# Patient Record
Sex: Female | Born: 1980
Health system: Southern US, Community
[De-identification: ages and names within clinical notes are randomized; demographics above are authoritative.]

## PROBLEM LIST (undated history)

## (undated) DIAGNOSIS — Z789 Other specified health status: Secondary | ICD-10-CM

## (undated) DIAGNOSIS — Z973 Presence of spectacles and contact lenses: Secondary | ICD-10-CM

## (undated) DIAGNOSIS — D329 Benign neoplasm of meninges, unspecified: Secondary | ICD-10-CM

## (undated) HISTORY — DX: Benign neoplasm of meninges, unspecified: D32.9

## (undated) HISTORY — PX: WISDOM TOOTH EXTRACTION: SHX21

---
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
## 2003-08-20 ENCOUNTER — Other Ambulatory Visit: Admission: RE | Admit: 2003-08-20 | Discharge: 2003-08-20 | Payer: Self-pay | Admitting: Obstetrics and Gynecology

## 2012-02-12 ENCOUNTER — Ambulatory Visit (INDEPENDENT_AMBULATORY_CARE_PROVIDER_SITE_OTHER): Payer: BC Managed Care – PPO | Admitting: Family Medicine

## 2012-02-12 VITALS — BP 102/72 | HR 82 | Temp 97.8°F | Resp 16 | Ht 70.0 in | Wt 154.4 lb

## 2012-02-12 DIAGNOSIS — J40 Bronchitis, not specified as acute or chronic: Secondary | ICD-10-CM

## 2012-02-12 DIAGNOSIS — J329 Chronic sinusitis, unspecified: Secondary | ICD-10-CM

## 2012-02-12 MED ORDER — HYDROCODONE-HOMATROPINE 5-1.5 MG/5ML PO SYRP: 5.0000 mL | ORAL_SOLUTION | ORAL | Status: DC | PRN

## 2012-02-12 MED ORDER — AMOXICILLIN 875 MG PO TABS: 875.0000 mg | ORAL_TABLET | Freq: Two times a day (BID) | ORAL | Status: DC

## 2012-02-12 MED ORDER — BENZONATATE 100 MG PO CAPS: ORAL_CAPSULE | ORAL | Status: DC

## 2012-02-12 NOTE — Progress Notes (Signed)
Subjective: It is been a while since patient is being here. She is generally healthy lady. She got a cold about 2 weeks ago. It seemed to be doing better. It just started with a bad cough. Then a few days ago things right back up. She has a lot of head congestion and pressure. She is coughing up a lot of mucus and blowing a lot of mucus. She did not have any fever. She did not have a flu shot this year. She's been trying to get enough rest. She works as a IT consultant. Ears are not bothering her.  Objective:  TMs are normal. Nose is runny. She is a little red from blowing. Throat clear. Neck supple with small nodes. Chest is clear to auscultation. Heart regular without murmurs.  Assessment: Sinusitis/bronchitis  Plan: Explained to her that this could be a second virus or it could be a bacterial exacerbation of the previous scars. We are going treat with antibiotics and symptomatic therapy. She understands that. No labs were today.

## 2012-02-12 NOTE — Patient Instructions (Signed)
Fluids, rest  Tylenol or ibuprofen for aching or pain or fever  Cough syrup at night  Cough pills (tessalon) at work  Amoxicillin twice daily  Return if worse

## 2012-06-28 ENCOUNTER — Ambulatory Visit: Payer: BC Managed Care – PPO | Admitting: Family Medicine

## 2014-11-08 ENCOUNTER — Ambulatory Visit (INDEPENDENT_AMBULATORY_CARE_PROVIDER_SITE_OTHER): Payer: 59 | Admitting: Family Medicine

## 2014-11-08 ENCOUNTER — Ambulatory Visit (INDEPENDENT_AMBULATORY_CARE_PROVIDER_SITE_OTHER): Payer: 59

## 2014-11-08 VITALS — BP 128/81 | HR 63 | Temp 97.6°F | Resp 16 | Ht 70.0 in | Wt 153.2 lb

## 2014-11-08 DIAGNOSIS — M545 Low back pain, unspecified: Secondary | ICD-10-CM

## 2014-11-08 DIAGNOSIS — R109 Unspecified abdominal pain: Secondary | ICD-10-CM

## 2014-11-08 DIAGNOSIS — R110 Nausea: Secondary | ICD-10-CM

## 2014-11-08 DIAGNOSIS — K5900 Constipation, unspecified: Secondary | ICD-10-CM | POA: Diagnosis not present

## 2014-11-08 DIAGNOSIS — R1031 Right lower quadrant pain: Secondary | ICD-10-CM | POA: Diagnosis not present

## 2014-11-08 LAB — POCT URINALYSIS DIP (MANUAL ENTRY)
Bilirubin, UA: NEGATIVE
GLUCOSE UA: NEGATIVE
Ketones, POC UA: NEGATIVE
Leukocytes, UA: NEGATIVE
NITRITE UA: NEGATIVE
PROTEIN UA: NEGATIVE
SPEC GRAV UA: 1.015
UROBILINOGEN UA: 0.2
pH, UA: 6

## 2014-11-08 LAB — POC MICROSCOPIC URINALYSIS (UMFC): Mucus: ABSENT

## 2014-11-08 LAB — POCT CBC
Granulocyte percent: 54.7 %G (ref 37–80)
HEMATOCRIT: 35.8 % — AB (ref 37.7–47.9)
Hemoglobin: 12.3 g/dL (ref 12.2–16.2)
Lymph, poc: 3.6 — AB (ref 0.6–3.4)
MCH: 30.6 pg (ref 27–31.2)
MCHC: 34.2 g/dL (ref 31.8–35.4)
MCV: 89.6 fL (ref 80–97)
MID (CBC): 0.5 (ref 0–0.9)
MPV: 7.8 fL (ref 0–99.8)
POC GRANULOCYTE: 4.9 (ref 2–6.9)
POC LYMPH PERCENT: 39.7 %L (ref 10–50)
POC MID %: 5.6 % (ref 0–12)
Platelet Count, POC: 264 10*3/uL (ref 142–424)
RBC: 4 M/uL — AB (ref 4.04–5.48)
RDW, POC: 13.3 %
WBC: 9 10*3/uL (ref 4.6–10.2)

## 2014-11-08 LAB — POCT URINE PREGNANCY: Preg Test, Ur: NEGATIVE

## 2014-11-08 MED ORDER — POLYETHYLENE GLYCOL 3350 17 GM/SCOOP PO POWD: 17.0000 g | Freq: Two times a day (BID) | ORAL | Status: DC | PRN

## 2014-11-08 NOTE — Progress Notes (Signed)
[facility] [address], [place] [address] [phone]  Date:  11/08/2014   Name:  April Peterson   DOB:  [birth year]   MRN:  [id number]  PCP:  No primary care provider on file.    History of Present Illness:  April Peterson is a 34 y.o. female patient who presents to [facility] for chief complaint right flank pain, and nausea for 2 days.  Flank pain is shooting at the right side, but can also be intermittently dull.  She has some nausea, but no emesis.  She also had some diarrhea.  No blood or melena.  Pain has subsided at this time, but the pain can get severe 5/10.  She has no fever, dysuria, hematuria, or frequency.  She has no hx of kidney stone.  She hydrates 6 glasses of water.    She is currently on her menses since 4 days ago. -She generally has BM every other day.  Reports difficult GI for years.  No personal or familial hx of IBD.  There are no active problems to display for this patient.   History reviewed. No pertinent past medical history.  History reviewed. No pertinent past surgical history.  Social History  Substance Use Topics  . Smoking status: Never Smoker   . Smokeless tobacco: Never Used  . Alcohol Use: No    History reviewed. No pertinent family history.  No Known Allergies  Medication list has been reviewed and updated.  No current outpatient prescriptions on file prior to visit.   No current facility-administered medications on file prior to visit.    ROS ROS otherwise unremarkable unless listed above.    Physical Examination: BP 128/81 mmHg  Pulse 63  Temp(Src) 97.6 F (36.4 C) (Oral)  Resp 16  Ht 5\' 10"  (1.778 m)  Wt 153 lb 4 oz (69.514 kg)  BMI 21.99 kg/m2  SpO2 98%  LMP 11/05/2014 Ideal Body Weight: Weight in (lb) to have BMI = 25: 173.9  Physical Exam  Constitutional: She is oriented to person, place, and time. She appears well-developed and well-nourished. No distress.  HENT:  Head: Normocephalic and  atraumatic.  Right Ear: External ear normal.  Left Ear: External ear normal.  Eyes: Conjunctivae and EOM are normal. Pupils are equal, round, and reactive to light.  Cardiovascular: Normal rate.   Pulmonary/Chest: Effort normal. No respiratory distress.  Abdominal: Soft. Normal appearance. Bowel sounds are increased. There is no hepatosplenomegaly. There is tenderness in the right lower quadrant and suprapubic area. There is CVA tenderness.  Neurological: She is alert and oriented to person, place, and time.  Skin: She is not diaphoretic.  Psychiatric: She has a normal mood and affect. Her behavior is normal.    [facility] reading (PRIMARY) by  Dr. Joseph Art: Substantial stool at the ascending colon.  Results for orders placed or performed in visit on 11/08/14  POCT Microscopic Urinalysis ([facility])  Result Value Ref Range   WBC,UR,HPF,POC Few (A) None WBC/hpf   RBC,UR,HPF,POC Few (A) None RBC/hpf   Bacteria Few (A) None, Too numerous to count   Mucus Absent Absent   Epithelial Cells, UR Per Microscopy Few (A) None, Too numerous to count cells/hpf  POCT urinalysis dipstick  Result Value Ref Range   Color, UA yellow yellow   Clarity, UA clear clear   Glucose, UA negative negative   Bilirubin, UA negative negative   Ketones, POC UA negative negative   Spec Grav, UA 1.015  Blood, UA trace-intact (A) negative   pH, UA 6.0    Protein Ur, POC negative negative   Urobilinogen, UA 0.2    Nitrite, UA Negative Negative   Leukocytes, UA Negative Negative  POCT CBC  Result Value Ref Range   WBC 9.0 4.6 - 10.2 K/uL   Lymph, poc 3.6 (A) 0.6 - 3.4   POC LYMPH PERCENT 39.7 10 - 50 %L   MID (cbc) 0.5 0 - 0.9   POC MID % 5.6 0 - 12 %M   POC Granulocyte 4.9 2 - 6.9   Granulocyte percent 54.7 37 - 80 %G   RBC 4.00 (A) 4.04 - 5.48 M/uL   Hemoglobin 12.3 12.2 - 16.2 g/dL   HCT, POC 35.8 (A) 37.7 - 47.9 %   MCV 89.6 80 - 97 fL   MCH, POC 30.6 27 - 31.2 pg   MCHC 34.2 31.8 - 35.4 g/dL   RDW, POC  13.3 %   Platelet Count, POC 264 142 - 424 K/uL   MPV 7.8 0 - 99.8 fL  POCT urine pregnancy  Result Value Ref Range   Preg Test, Ur Negative Negative    Assessment and Plan: KHRISTIN Peterson is a 34 y.o. female who is here today for nausea and right flank pain. -diff dx includes nephrolithiasis, pyelo, constipation.  Appears likely the constipation.  We will treat with miralax at this time.  Advised to return if stools are not substantial, nausea returns, fever, and increased pain etc.    Constipation, unspecified constipation type - Plan: polyethylene glycol powder (GLYCOLAX/MIRALAX) powder  Bilateral low back pain without sciatica - Plan: POCT Microscopic Urinalysis ([facility]), POCT urinalysis dipstick  Right flank pain - Plan: POCT CBC, POCT urine pregnancy, DG Abd 1 View  Nausea without vomiting - Plan: POCT CBC, POCT urine pregnancy, DG Abd 1 View  Right lower quadrant pain  Ivar Drape, PA-C [facility] 11/08/2014 8:00 PM    Reviewed and agree with plan Ruthe Mannan, MD

## 2014-11-08 NOTE — Patient Instructions (Signed)

## 2014-11-09 ENCOUNTER — Encounter: Payer: Self-pay | Admitting: Physician Assistant

## 2015-06-04 LAB — OB RESULTS CONSOLE HIV ANTIBODY (ROUTINE TESTING): HIV: NONREACTIVE

## 2015-06-04 LAB — OB RESULTS CONSOLE RUBELLA ANTIBODY, IGM: Rubella: IMMUNE

## 2015-06-04 LAB — OB RESULTS CONSOLE ABO/RH: RH Type: POSITIVE

## 2015-06-04 LAB — OB RESULTS CONSOLE HEPATITIS B SURFACE ANTIGEN: HEP B S AG: NEGATIVE

## 2015-06-04 LAB — OB RESULTS CONSOLE RPR: RPR: NONREACTIVE

## 2015-06-04 LAB — OB RESULTS CONSOLE ANTIBODY SCREEN: ANTIBODY SCREEN: NEGATIVE

## 2015-06-12 LAB — OB RESULTS CONSOLE GC/CHLAMYDIA
CHLAMYDIA, DNA PROBE: NEGATIVE
Gonorrhea: NEGATIVE

## 2015-12-04 LAB — OB RESULTS CONSOLE GBS: STREP GROUP B AG: NEGATIVE

## 2015-12-19 ENCOUNTER — Encounter (HOSPITAL_COMMUNITY): Payer: Self-pay | Admitting: *Deleted

## 2015-12-19 ENCOUNTER — Inpatient Hospital Stay (HOSPITAL_COMMUNITY)
Admission: AD | Admit: 2015-12-19 | Discharge: 2015-12-21 | DRG: 775 | Disposition: A | Discharge status: Home / Self Care | Payer: 59 | Source: Ambulatory Visit | Attending: Obstetrics and Gynecology | Admitting: Obstetrics and Gynecology

## 2015-12-19 ENCOUNTER — Inpatient Hospital Stay (HOSPITAL_COMMUNITY): Payer: 59 | Admitting: Anesthesiology

## 2015-12-19 ENCOUNTER — Inpatient Hospital Stay (HOSPITAL_COMMUNITY)
Admission: AD | Admit: 2015-12-19 | Discharge: 2015-12-19 | Disposition: A | Discharge status: Home / Self Care | Payer: 59 | Source: Ambulatory Visit | Attending: Obstetrics and Gynecology | Admitting: Obstetrics and Gynecology

## 2015-12-19 ENCOUNTER — Encounter (HOSPITAL_COMMUNITY): Payer: Self-pay

## 2015-12-19 DIAGNOSIS — O4292 Full-term premature rupture of membranes, unspecified as to length of time between rupture and onset of labor: Secondary | ICD-10-CM | POA: Diagnosis present

## 2015-12-19 DIAGNOSIS — Z3A37 37 weeks gestation of pregnancy: Secondary | ICD-10-CM | POA: Diagnosis not present

## 2015-12-19 DIAGNOSIS — O9962 Diseases of the digestive system complicating childbirth: Secondary | ICD-10-CM | POA: Diagnosis present

## 2015-12-19 DIAGNOSIS — K219 Gastro-esophageal reflux disease without esophagitis: Secondary | ICD-10-CM | POA: Diagnosis present

## 2015-12-19 HISTORY — DX: Other specified health status: Z78.9

## 2015-12-19 LAB — CBC
HCT: 37.1 % (ref 36.0–46.0)
HEMOGLOBIN: 13.3 g/dL (ref 12.0–15.0)
MCH: 30.9 pg (ref 26.0–34.0)
MCHC: 35.8 g/dL (ref 30.0–36.0)
MCV: 86.1 fL (ref 78.0–100.0)
Platelets: 226 10*3/uL (ref 150–400)
RBC: 4.31 MIL/uL (ref 3.87–5.11)
RDW: 13 % (ref 11.5–15.5)
WBC: 16.6 10*3/uL — AB (ref 4.0–10.5)

## 2015-12-19 LAB — TYPE AND SCREEN
ABO/RH(D): A POS
ANTIBODY SCREEN: NEGATIVE

## 2015-12-19 LAB — POCT FERN TEST

## 2015-12-19 LAB — ABO/RH: ABO/RH(D): A POS

## 2015-12-19 LAB — RPR: RPR Ser Ql: NONREACTIVE

## 2015-12-19 MED ORDER — IBUPROFEN 600 MG PO TABS
600.0000 mg | ORAL_TABLET | Freq: Four times a day (QID) | ORAL | Status: DC
  Administered 2015-12-19 – 2015-12-21 (×8): 600 mg via ORAL
  Filled 2015-12-19 (×8): qty 1

## 2015-12-19 MED ORDER — DIBUCAINE 1 % RE OINT: 1.0000 "application " | TOPICAL_OINTMENT | RECTAL | Status: DC | PRN

## 2015-12-19 MED ORDER — PHENYLEPHRINE 40 MCG/ML (10ML) SYRINGE FOR IV PUSH (FOR BLOOD PRESSURE SUPPORT)
80.0000 ug | PREFILLED_SYRINGE | INTRAVENOUS | Status: DC | PRN
  Filled 2015-12-19: qty 5

## 2015-12-19 MED ORDER — LACTATED RINGERS IV SOLN: 500.0000 mL | Freq: Once | INTRAVENOUS | Status: DC

## 2015-12-19 MED ORDER — PRENATAL MULTIVITAMIN CH
1.0000 | ORAL_TABLET | Freq: Every day | ORAL | Status: DC
  Administered 2015-12-20: 1 via ORAL
  Filled 2015-12-19: qty 1

## 2015-12-19 MED ORDER — ERYTHROMYCIN 5 MG/GM OP OINT
TOPICAL_OINTMENT | OPHTHALMIC | Status: AC
  Filled 2015-12-19: qty 1

## 2015-12-19 MED ORDER — OXYCODONE-ACETAMINOPHEN 5-325 MG PO TABS: 1.0000 | ORAL_TABLET | ORAL | Status: DC | PRN

## 2015-12-19 MED ORDER — ONDANSETRON HCL 4 MG/2ML IJ SOLN: 4.0000 mg | Freq: Four times a day (QID) | INTRAMUSCULAR | Status: DC | PRN

## 2015-12-19 MED ORDER — DIPHENHYDRAMINE HCL 50 MG/ML IJ SOLN: 12.5000 mg | INTRAMUSCULAR | Status: DC | PRN

## 2015-12-19 MED ORDER — ACETAMINOPHEN 325 MG PO TABS: 650.0000 mg | ORAL_TABLET | ORAL | Status: DC | PRN

## 2015-12-19 MED ORDER — EPHEDRINE 5 MG/ML INJ
10.0000 mg | INTRAVENOUS | Status: DC | PRN
  Filled 2015-12-19: qty 4

## 2015-12-19 MED ORDER — PHENYLEPHRINE 40 MCG/ML (10ML) SYRINGE FOR IV PUSH (FOR BLOOD PRESSURE SUPPORT)
80.0000 ug | PREFILLED_SYRINGE | INTRAVENOUS | Status: DC | PRN
  Filled 2015-12-19: qty 5
  Filled 2015-12-19: qty 10

## 2015-12-19 MED ORDER — BENZOCAINE-MENTHOL 20-0.5 % EX AERO
1.0000 "application " | INHALATION_SPRAY | CUTANEOUS | Status: DC | PRN
  Administered 2015-12-19 – 2015-12-21 (×2): 1 via TOPICAL
  Filled 2015-12-19 (×2): qty 56

## 2015-12-19 MED ORDER — BUTORPHANOL TARTRATE 1 MG/ML IJ SOLN
1.0000 mg | INTRAMUSCULAR | Status: DC | PRN
  Administered 2015-12-19: 1 mg via INTRAVENOUS

## 2015-12-19 MED ORDER — FLEET ENEMA 7-19 GM/118ML RE ENEM: 1.0000 | ENEMA | RECTAL | Status: DC | PRN

## 2015-12-19 MED ORDER — OXYTOCIN 40 UNITS IN LACTATED RINGERS INFUSION - SIMPLE MED
2.5000 [IU]/h | INTRAVENOUS | Status: DC
  Filled 2015-12-19: qty 1000

## 2015-12-19 MED ORDER — ONDANSETRON HCL 4 MG/2ML IJ SOLN: 4.0000 mg | INTRAMUSCULAR | Status: DC | PRN

## 2015-12-19 MED ORDER — OXYCODONE HCL 5 MG PO TABS: 10.0000 mg | ORAL_TABLET | ORAL | Status: DC | PRN

## 2015-12-19 MED ORDER — FENTANYL 2.5 MCG/ML BUPIVACAINE 1/10 % EPIDURAL INFUSION (WH - ANES)
14.0000 mL/h | INTRAMUSCULAR | Status: DC | PRN
  Administered 2015-12-19: 14 mL/h via EPIDURAL
  Filled 2015-12-19: qty 100

## 2015-12-19 MED ORDER — LACTATED RINGERS IV SOLN: 500.0000 mL | INTRAVENOUS | Status: DC | PRN

## 2015-12-19 MED ORDER — COCONUT OIL OIL
1.0000 "application " | TOPICAL_OIL | Status: DC | PRN
  Filled 2015-12-19: qty 120

## 2015-12-19 MED ORDER — WITCH HAZEL-GLYCERIN EX PADS: 1.0000 "application " | MEDICATED_PAD | CUTANEOUS | Status: DC | PRN

## 2015-12-19 MED ORDER — SIMETHICONE 80 MG PO CHEW: 80.0000 mg | CHEWABLE_TABLET | ORAL | Status: DC | PRN

## 2015-12-19 MED ORDER — OXYCODONE HCL 5 MG PO TABS: 5.0000 mg | ORAL_TABLET | ORAL | Status: DC | PRN

## 2015-12-19 MED ORDER — BUTORPHANOL TARTRATE 1 MG/ML IJ SOLN
INTRAMUSCULAR | Status: AC
  Filled 2015-12-19: qty 1

## 2015-12-19 MED ORDER — DIPHENHYDRAMINE HCL 25 MG PO CAPS: 25.0000 mg | ORAL_CAPSULE | Freq: Four times a day (QID) | ORAL | Status: DC | PRN

## 2015-12-19 MED ORDER — LACTATED RINGERS IV SOLN
INTRAVENOUS | Status: DC
  Administered 2015-12-19: 06:00:00 via INTRAVENOUS

## 2015-12-19 MED ORDER — ZOLPIDEM TARTRATE 5 MG PO TABS: 5.0000 mg | ORAL_TABLET | Freq: Every evening | ORAL | Status: DC | PRN

## 2015-12-19 MED ORDER — OXYCODONE-ACETAMINOPHEN 5-325 MG PO TABS: 2.0000 | ORAL_TABLET | ORAL | Status: DC | PRN

## 2015-12-19 MED ORDER — VITAMIN K1 1 MG/0.5ML IJ SOLN
INTRAMUSCULAR | Status: AC
  Filled 2015-12-19: qty 0.5

## 2015-12-19 MED ORDER — LIDOCAINE HCL (PF) 1 % IJ SOLN
30.0000 mL | INTRAMUSCULAR | Status: DC | PRN
  Filled 2015-12-19: qty 30

## 2015-12-19 MED ORDER — TETANUS-DIPHTH-ACELL PERTUSSIS 5-2.5-18.5 LF-MCG/0.5 IM SUSP: 0.5000 mL | Freq: Once | INTRAMUSCULAR | Status: DC

## 2015-12-19 MED ORDER — SENNOSIDES-DOCUSATE SODIUM 8.6-50 MG PO TABS
2.0000 | ORAL_TABLET | ORAL | Status: DC
  Administered 2015-12-19 – 2015-12-20 (×2): 2 via ORAL
  Filled 2015-12-19 (×2): qty 2

## 2015-12-19 MED ORDER — OXYTOCIN BOLUS FROM INFUSION: 500.0000 mL | Freq: Once | INTRAVENOUS | Status: DC

## 2015-12-19 MED ORDER — LIDOCAINE HCL (PF) 1 % IJ SOLN
INTRAMUSCULAR | Status: DC | PRN
  Administered 2015-12-19 (×2): 6 mL via EPIDURAL

## 2015-12-19 MED ORDER — SOD CITRATE-CITRIC ACID 500-334 MG/5ML PO SOLN
30.0000 mL | ORAL | Status: DC | PRN
  Filled 2015-12-19: qty 15

## 2015-12-19 MED ORDER — ONDANSETRON HCL 4 MG PO TABS
4.0000 mg | ORAL_TABLET | ORAL | Status: DC | PRN
Start: 2015-12-19 — End: 2015-12-21

## 2015-12-19 NOTE — Lactation Note (Signed)
This note was copied from a baby's chart. Lactation Consultation Note  Patient Name: April Peterson [id number] Date: 12/19/2015 Reason for consult: Initial assessment   Initial assessment with first time mom of < 1 hour old infant in [place]. Infant STS with mom and cueing to feed. Mom reports + breast changes with pregnancy.  Mom with semi compressible breasts and areola, nipples are semi flat ans evert with stim. Assisted mom in latching infant to left breast in the laid back cross cradle hold. Infant rooting and latched after about 5 minutes. He fed for 15 minutes and self detached. He was noted to have flanged lips, rhythmic suckles and intermittent swallows. Infant was then weighed and measured. He then went back to breast on the right side. He latched readily and fed for 10 minutes and was still feeding when I left the room.   Enc mom to hand express prior to latch. Enc mom to feed 8-12 x in 24 hours at first feeding cues. Enc mom to massage/compress breast with feeding. BF basics, positioning, pillow support, colostrum, milk coming to volume, cluster feeding, NB feeding behavior and nutritional needs discussed. Enc mom to call out to desk for feeding assistance as needed. Feeding log given with instructions for use.   [place] Brochure and BF Resources Handout given, mom informed of IP/OP Services, BF Support Groups and [place] phone #. Enc mom to call out for feeding assistance as needed.      Maternal Data Formula Feeding for Exclusion: No Has patient been taught Hand Expression?: Yes Does the patient have breastfeeding experience prior to this delivery?: No  Feeding Feeding Type: Breast Fed Length of feed: 25 min  LATCH Score/Interventions Latch: Grasps breast easily, tongue down, lips flanged, rhythmical sucking.  Audible Swallowing: A few with stimulation Intervention(s): Skin to skin;Hand expression;Alternate breast massage  Type of Nipple: Everted at rest and after  stimulation  Comfort (Breast/Nipple): Soft / non-tender     Hold (Positioning): Assistance needed to correctly position infant at breast and maintain latch. Intervention(s): Breastfeeding basics reviewed;Support Pillows;Position options;Skin to skin  LATCH Score: 8  Lactation Tools Discussed/Used WIC Program: No   Consult Status Consult Status: Follow-up Date: 12/20/15 Follow-up type: In-patient    [name] 12/19/2015, 1:27 PM

## 2015-12-19 NOTE — Discharge Instructions (Signed)
Braxton Hicks Contractions °Contractions of the uterus can occur throughout pregnancy. Contractions are not always a sign that you are in labor.  °WHAT ARE BRAXTON HICKS CONTRACTIONS?  °Contractions that occur before labor are called Braxton Hicks contractions, or false labor. Toward the end of pregnancy (32-34 weeks), these contractions can develop more often and may become more forceful. This is not true labor because these contractions do not result in opening (dilatation) and thinning of the cervix. They are sometimes difficult to tell apart from true labor because these contractions can be forceful and people have different pain tolerances. You should not feel embarrassed if you go to the hospital with false labor. Sometimes, the only way to tell if you are in true labor is for your health care provider to look for changes in the cervix. °If there are no prenatal problems or other health problems associated with the pregnancy, it is completely safe to be sent home with false labor and await the onset of true labor. °HOW CAN YOU TELL THE DIFFERENCE BETWEEN TRUE AND FALSE LABOR? °False Labor  °· The contractions of false labor are usually shorter and not as hard as those of true labor.   °· The contractions are usually irregular.   °· The contractions are often felt in the front of the lower abdomen and in the groin.   °· The contractions may go away when you walk around or change positions while lying down.   °· The contractions get weaker and are shorter lasting as time goes on.   °· The contractions do not usually become progressively stronger, regular, and closer together as with true labor.   °True Labor  °· Contractions in true labor last 30-70 seconds, become very regular, usually become more intense, and increase in frequency.   °· The contractions do not go away with walking.   °· The discomfort is usually felt in the top of the uterus and spreads to the lower abdomen and low back.   °· True labor can be  determined by your health care provider with an exam. This will show that the cervix is dilating and getting thinner.   °WHAT TO REMEMBER °· Keep up with your usual exercises and follow other instructions given by your health care provider.   °· Take medicines as directed by your health care provider.   °· Keep your regular prenatal appointments.   °· Eat and drink lightly if you think you are going into labor.   °· If Braxton Hicks contractions are making you uncomfortable:   °¨ Change your position from lying down or resting to walking, or from walking to resting.   °¨ Sit and rest in a tub of warm water.   °¨ Drink 2-3 glasses of water. Dehydration may cause these contractions.   °¨ Do slow and deep breathing several times an hour.   °WHEN SHOULD I SEEK IMMEDIATE MEDICAL CARE? °Seek immediate medical care if: °· Your contractions become stronger, more regular, and closer together.   °· You have fluid leaking or gushing from your vagina.   °· You have a fever.   °· You pass blood-tinged mucus.   °· You have vaginal bleeding.   °· You have continuous abdominal pain.   °· You have low back pain that you never had before.   °· You feel your baby's head pushing down and causing pelvic pressure.   °· Your baby is not moving as much as it used to.   °This information is not intended to replace advice given to you by your health care provider. Make sure you discuss any questions you have with your health care   provider. °Document Released: 12/22/2004 Document Revised: 04/15/2015 Document Reviewed: 10/03/2012 °Elsevier Interactive Patient Education © 2017 Elsevier Inc. ° °

## 2015-12-19 NOTE — H&P (Signed)
April Peterson is a 35 y.o. female presenting for SROM this am. OB History    Gravida Para Term Preterm AB Living   1             SAB TAB Ectopic Multiple Live Births                 Past Medical History:  Diagnosis Date  . Medical history non-contributory    Past Surgical History:  Procedure Laterality Date  . WISDOM TOOTH EXTRACTION     Family History: family history is not on file. Social History:  reports that she has never smoked. She has never used smokeless tobacco. She reports that she does not drink alcohol or use drugs.     Maternal Diabetes: No Genetic Screening: Normal Maternal Ultrasounds/Referrals: Normal Fetal Ultrasounds or other Referrals:  None Maternal Substance Abuse:  No Significant Maternal Medications:  None Significant Maternal Lab Results:  None Other Comments:  None  Review of Systems  Eyes: Negative for blurred vision.  Gastrointestinal: Negative for abdominal pain.  Neurological: Negative for headaches.   Maternal Medical History:  Reason for admission: Rupture of membranes.   Fetal activity: Perceived fetal activity is normal.      Dilation: 5.5 Effacement (%): 100 Station: 0 Exam by:: lee Blood pressure 118/70, pulse 81, temperature 97.9 F (36.6 C), temperature source Oral, resp. rate 16, height 5\' 10"  (1.778 m), weight 188 lb (85.3 kg), SpO2 100 %. Maternal Exam:  Uterine Assessment: Contraction strength is firm.  Contraction frequency is regular.   Abdomen: Patient reports no abdominal tenderness. Fetal presentation: vertex     Fetal Exam Fetal State Assessment: Category I - tracings are normal.     Physical Exam  Cardiovascular: Normal rate and regular rhythm.   Respiratory: Effort normal.  GI: Soft.  Neurological: She has normal reflexes.    Cx 5.5/C/0 per nurse check  Prenatal labs: ABO, Rh: --/--/A POS (12/14 [id number]) Antibody: NEG (12/14 0553) Rubella: Immune (05/30 0000) RPR: Nonreactive (05/30 0000)   HBsAg: Negative (05/30 0000)  HIV: Non-reactive (05/30 0000)  GBS: Negative (11/29 0000)   Assessment/Plan: 35 yo G1P0 Active Labor   April Peterson,April Peterson 12/19/2015, 8:29 AM

## 2015-12-19 NOTE — Anesthesia Postprocedure Evaluation (Signed)
Anesthesia Post Note  Patient: April Peterson  Procedure(s) Performed: * No procedures listed *  Patient location during evaluation: Mother Baby Anesthesia Type: Epidural Level of consciousness: awake and alert Pain management: satisfactory to patient Vital Signs Assessment: post-procedure vital signs reviewed and stable Respiratory status: respiratory function stable Cardiovascular status: stable Postop Assessment: no headache, no backache, epidural receding, patient able to bend at knees, no signs of nausea or vomiting and adequate PO intake Anesthetic complications: no     Last Vitals:  Vitals:   12/19/15 1420 12/19/15 1550  BP: 123/72 124/69  Pulse: 91 73  Resp: 18 18  Temp:  36.8 C    Last Pain:  Vitals:   12/19/15 1550  TempSrc: Oral  PainSc: 0-No pain   Pain Goal: Patients Stated Pain Goal: 8 (12/19/15 0700)               Katherina Mires

## 2015-12-19 NOTE — MAU Note (Signed)
Pt returns with report of ROM

## 2015-12-19 NOTE — Anesthesia Preprocedure Evaluation (Signed)
Anesthesia Evaluation  Patient identified by MRN, date of birth, ID band Patient awake    Reviewed: Allergy & Precautions, NPO status , Patient's Chart, lab work & pertinent test results  History of Anesthesia Complications Negative for: history of anesthetic complications  Airway Mallampati: II  TM Distance: >3 FB Neck ROM: Full    Dental  (+) Dental Advisory Given   Pulmonary neg pulmonary ROS,    breath sounds clear to auscultation       Cardiovascular negative cardio ROS   Rhythm:Regular Rate:Normal     Neuro/Psych negative neurological ROS     GI/Hepatic Neg liver ROS, GERD  Poorly Controlled,  Endo/Other  negative endocrine ROS  Renal/GU negative Renal ROS     Musculoskeletal negative musculoskeletal ROS (+)   Abdominal   Peds  Hematology negative hematology ROS (+) plt 226k   Anesthesia Other Findings   Reproductive/Obstetrics (+) Pregnancy                             Anesthesia Physical Anesthesia Plan  ASA: II  Anesthesia Plan: Epidural   Post-op Pain Management:    Induction:   Airway Management Planned: Natural Airway  Additional Equipment:   Intra-op Plan:   Post-operative Plan:   Informed Consent: I have reviewed the patients History and Physical, chart, labs and discussed the procedure including the risks, benefits and alternatives for the proposed anesthesia with the patient or authorized representative who has indicated his/her understanding and acceptance.     Plan Discussed with:   Anesthesia Plan Comments: (Patient identified. Risks/Benefits/Options discussed with patient including but not limited to bleeding, infection, nerve damage, paralysis, failed block, incomplete pain control, headache, blood pressure changes, nausea, vomiting, reactions to medication both or allergic, itching and postpartum back pain. Confirmed with bedside nurse the patient's  most recent platelet count. Confirmed with patient that they are not currently taking any anticoagulation, have any bleeding history or any family history of bleeding disorders. Patient expressed understanding and wished to proceed. All questions were answered. )        Anesthesia Quick Evaluation

## 2015-12-19 NOTE — Anesthesia Procedure Notes (Signed)
Epidural Patient location during procedure: OB Start time: 12/19/2015 7:53 AM End time: 12/19/2015 8:11 AM  Staffing Anesthesiologist: Annye Asa Performed: anesthesiologist   Preanesthetic Checklist Completed: patient identified, surgical consent, pre-op evaluation, timeout performed, IV checked, risks and benefits discussed and monitors and equipment checked  Epidural Patient position: sitting Prep: site prepped and draped and DuraPrep Patient monitoring: blood pressure, continuous pulse ox and heart rate Approach: midline Location: L2-L3 Injection technique: LOR air  Needle:  Needle type: Tuohy  Needle gauge: 17 G Needle length: 9 cm Needle insertion depth: 5 cm Catheter type: closed end flexible Catheter size: 19 Gauge Catheter at skin depth: 10 cm Test dose: negative (1% lidocaine)  Assessment Events: blood not aspirated, injection not painful, no injection resistance, negative IV test and no paresthesia  Additional Notes Pt identified in Labor room.  Monitors applied. Working IV access confirmed. Sterile prep, drape lumbar spine.  1% lido local L 2,3.  #17ga Touhy LOR air at 5 cm L 2,3, cath in easily to 10 cm skin. Test dose OK, cath dosed and infusion begun.  Patient asymptomatic, VSS, no heme aspirated, tolerated well.  Jenita Seashore, MDReason for block:procedure for pain

## 2015-12-19 NOTE — MAU Note (Signed)
Pt reports contractions, denies bleeding or ROM.  

## 2015-12-19 NOTE — Anesthesia Pain Management Evaluation Note (Signed)
  CRNA Pain Management Visit Note  Patient: April Peterson, 35 y.o., female  "Hello I am a member of the anesthesia team at [facility]. We have an anesthesia team available at all times to provide care throughout the hospital, including epidural management and anesthesia for C-section. I don't know your plan for the delivery whether it a natural birth, water birth, IV sedation, nitrous supplementation, doula or epidural, but we want to meet your pain goals."   1.Was your pain managed to your expectations on prior hospitalizations?   No prior hospitalizations  2.What is your expectation for pain management during this hospitalization?     Epidural  3.How can we help you reach that goal? Epidural in situ.  Record the patient's initial score and the patient's pain goal.   Pain: 6  Pain Goal: 6 [facility] wants you to be able to say your pain was always managed very well.  Byanka Landrus L 12/19/2015

## 2015-12-19 NOTE — Progress Notes (Signed)
Delivery Note At 12:09 PM a viable female was delivered via Vaginal, Spontaneous Delivery (Presentation: ;  ).  APGAR: 9, 9; weight  .   Placenta status: intact, .  Cord: e vessels  with the following complications: .  Cord pH: N/A  Anesthesia:   Episiotomy: None Lacerations: 2nd degree Suture Repair: 3.0 vicryl rapide Est. Blood Loss (mL):  150  Mom to postpartum.  Baby to Couplet care / Skin to Skin.  Milley Vining II,Vana Arif E 12/19/2015, 12:26 PM

## 2015-12-20 LAB — CBC
HCT: 34.7 % — ABNORMAL LOW (ref 36.0–46.0)
HEMOGLOBIN: 12 g/dL (ref 12.0–15.0)
MCH: 30.4 pg (ref 26.0–34.0)
MCHC: 34.6 g/dL (ref 30.0–36.0)
MCV: 87.8 fL (ref 78.0–100.0)
Platelets: 205 10*3/uL (ref 150–400)
RBC: 3.95 MIL/uL (ref 3.87–5.11)
RDW: 13.5 % (ref 11.5–15.5)
WBC: 16.1 10*3/uL — ABNORMAL HIGH (ref 4.0–10.5)

## 2015-12-20 NOTE — Lactation Note (Signed)
This note was copied from a baby's chart. Lactation Consultation Note  Patient Name: April Peterson [id number] Date: 12/20/2015 Reason for consult: Follow-up assessment;Breast/nipple pain  Follow-up at 28 hrs at 1615 but infant was too sleepy had just been circumcised.  Parents had expressed desire to go home this evening. GA 37.5; Bw 6 lbs, 8.8 oz.  Mom is a P1.  LS-7 by RN.   Infant has only breastfed x5 (15-25) in past 24 hrs; voids-1; stools-1. Mom c/o sore nipples and is using nipple shield. Reviewed hand expression with return demonstration and colostrum easily flowing.  Instructed mom to hand express and spoon feed infant.  Taught how to spoon feed. Spoons, curved tip syring, and colostrum collection containers given.  LC discussed concerns of early discharge and advised to stay another night to work on breastfeeding.   Mom consented to stay another night. Instructed to call for next latch.   Mom independently spoon fed 7 ml to infant between first and second visit from [facility].  [place] phone number given for her to call with next latch.  Mom called for latch assistance @ 1915 - 57 hrs old - stated infant was beginning to cue.   LC in room at [place].   Attempted to latch in cross-cradle on left breast without nipple shield but infant kept squirming; he took a few sucks but would come off squirming.   Began to act like he could not find nipple when nipple was in mouth, so LC assisted with latching in football hold on left side with #20 nipple shield.   Taught mom how to latch by sandwiching breast and using asymmetrical latching technique.   [facility] taught dad how to assist using teacup hold and flanging bottom lip.  Infant is intermittently biting during sucking.   Infant latched on left side FB hold and fed in a consistent sucking pattern.  Colostrum noted in shield when he came off.   Dad changed diaper and then mom and dad independently latched infant (with only verbal cues from [facility]) to right  breast football hold with nipple shield. Encouraged to continue feeding with cues and to offer both breast with each feeding.  Instructed to allow infant to feed on first breast for as long as he wants and then offer second side. Parents verbalized appreciation of help from [facility]. Encouraged to call for assistance as needed.       Maternal Data Has patient been taught Hand Expression?: Yes  Feeding Feeding Type: Breast Fed  LATCH Score/Interventions Latch: Grasps breast easily, tongue down, lips flanged, rhythmical sucking. (rhythmic sucking after several attempts) Intervention(s): Breast compression;Assist with latch;Adjust position;Breast massage  Audible Swallowing: A few with stimulation Intervention(s): Hand expression  Type of Nipple: Everted at rest and after stimulation  Comfort (Breast/Nipple): Filling, red/small blisters or bruises, mild/mod discomfort  Problem noted: Mild/Moderate discomfort Interventions (Mild/moderate discomfort): Hand massage;Hand expression (coconut oil in room)  Hold (Positioning): Assistance needed to correctly position infant at breast and maintain latch. Intervention(s): Breastfeeding basics reviewed;Support Pillows;Position options;Skin to skin  LATCH Score: 7  Lactation Tools Discussed/Used Tools: Nipple [place] [employer]   Consult Status Consult Status: Follow-up Follow-up type: In-patient    Merlene Laughter 12/20/2015, 8:09 PM

## 2015-12-20 NOTE — Progress Notes (Signed)
Post Partum Day 1 Subjective: no complaints, up ad lib, voiding, tolerating PO and + flatus  Objective: Blood pressure 125/78, pulse 74, temperature 98.2 F (36.8 C), temperature source Oral, resp. rate 18, height 5\' 10"  (1.778 m), weight 188 lb (85.3 kg), SpO2 99 %, unknown if currently breastfeeding.  Physical Exam:  General: alert and cooperative Lochia: appropriate Uterine Fundus: firm Incision: healing well DVT Evaluation: No evidence of DVT seen on physical exam. Negative Homan's sign. No cords or calf tenderness. No significant calf/ankle edema.   Recent Labs  12/19/15 0553 12/20/15 0517  HGB 13.3 12.0  HCT 37.1 34.7*    Assessment/Plan: Plan for discharge tomorrow and Circumcision prior to discharge   LOS: 1 day   CURTIS,CAROL G 12/20/2015, 6:47 AM

## 2015-12-20 NOTE — Discharge Summary (Signed)
Obstetric Discharge Summary Reason for Admission: onset of labor Prenatal Procedures: none Intrapartum Procedures: spontaneous vaginal delivery Postpartum Procedures: none Complications-Operative and Postpartum: none Hemoglobin  Date Value Ref Range Status  12/20/2015 12.0 12.0 - 15.0 g/dL Final   HCT  Date Value Ref Range Status  12/20/2015 34.7 (L) 36.0 - 46.0 % Final    Physical Exam:  General: alert, cooperative, appears stated age and no distress Lochia: appropriate Uterine Fundus: firm Incision: healing well DVT Evaluation: No evidence of DVT seen on physical exam.  Discharge Diagnoses: Term Pregnancy-delivered  Discharge Information: Date: 12/20/2015 Activity: pelvic rest Diet: routine Medications: None Condition: stable Instructions: refer to practice specific booklet Discharge to: home   Newborn Data: Live born female  Birth Weight: 6 lb 8.8 oz (2971 g) APGAR: 9, 9  Home with mother.  Carsen Leaf C 12/20/2015, 3:42 PM

## 2015-12-20 NOTE — Lactation Note (Signed)
This note was copied from a baby's chart. Lactation Consultation Note  Baby 58 hours old and sleeping.  BF approx 2 hours ago. Mother's nipples are sore and she states it is pinching when she breastfeeds. Mother was given a nipple shield during the night. Suggest mother call for LC to assist with next feeding. Suggest mother start post pumping 4-5 times a day for 10-15 min and give baby back volume pumped after next feeding.   Patient Name: [name] [id number] Date: 12/20/2015     Maternal Data    Feeding    LATCH Score/Interventions                      Lactation Tools Discussed/Used     Consult Status      April Peterson 12/20/2015, 2:01 PM

## 2015-12-21 NOTE — Progress Notes (Signed)
Post Partum Day 2 Subjective: no complaints, up ad lib, voiding and tolerating PO  Objective: Blood pressure (!) 102/53, pulse 62, temperature 98.4 F (36.9 C), resp. rate 18, height 5\' 10"  (1.778 m), weight 188 lb (85.3 kg), SpO2 99 %, unknown if currently breastfeeding.  Physical Exam:  General: alert, cooperative, appears stated age and no distress Lochia: appropriate Uterine Fundus: firm Incision: healing well DVT Evaluation: No evidence of DVT seen on physical exam.   Recent Labs  12/19/15 0553 12/20/15 0517  HGB 13.3 12.0  HCT 37.1 34.7*    Assessment/Plan: Discharge home, Breastfeeding and Circumcision prior to discharge done   LOS: 2 days   Krisalyn Yankowski C 12/21/2015, 7:14 AM

## 2015-12-21 NOTE — Lactation Note (Signed)
This note was copied from a baby's chart. Lactation Consultation Note  Patient Name: April Peterson [id number] Date: 12/21/2015 Reason for consult: Follow-up assessment;Other (Comment) (early term baby) Baby just came off the breast and asleep. Mom using 20 nipple shield to latch and reports observing breast milk in nipple shield with feedings. Mom not pumping yet but has Spectra 2 pump for home use. Reviewed how to use pump and encouraged to post pump 4-6 times per day for 10-15 minutes to encourage milk production and give baby back any amount of EBM received. Advised baby should be at breast 8-12 times in 24 hours and with feeding ques. Monitor void/stools. Engorgement care reviewed if needed, refer to Baby N Me booklet page 24, breast milk storage guidelines page 25. OP f/u scheduled for Wednesday, 12/25/15 at 1:00. Mom to call for questions/concerns.   Maternal Data    Feeding Feeding Type: Breast Fed Length of feed: 10 min  LATCH Score/Interventions Latch: Grasps breast easily, tongue down, lips flanged, rhythmical sucking.  Audible Swallowing: None  Type of Nipple: Everted at rest and after stimulation  Comfort (Breast/Nipple): Soft / non-tender     Hold (Positioning): No assistance needed to correctly position infant at breast.  LATCH Score: 8  Lactation Tools Discussed/Used Tools: Nipple [employer]   Consult Status Consult Status: Complete Date: 12/21/15 Follow-up type: In-patient    Katrine Coho 12/21/2015, 10:19 AM

## 2015-12-25 ENCOUNTER — Ambulatory Visit (HOSPITAL_COMMUNITY)
Admit: 2015-12-25 | Discharge: 2015-12-25 | Disposition: A | Discharge status: Home / Self Care | Payer: 59 | Attending: Obstetrics and Gynecology | Admitting: Obstetrics and Gynecology

## 2015-12-25 DIAGNOSIS — Z029 Encounter for administrative examinations, unspecified: Secondary | ICD-10-CM | POA: Diagnosis not present

## 2015-12-25 NOTE — Lactation Note (Signed)
Lactation Consult  Mother's reason for visit: Want to make sure April Peterson is feeding properly Visit Type Feeding assessment Appointment Notes:None   Consult:  Initial Lactation Consultant:  Ave Filter  ________________________________________________________________________ 57 Name:  Milus Banister Date of Birth:  12/19/2013 Pediatrician:  Dr. Maisie Fus Gender:  female Gestational Age: [redacted]w[redacted]d (At Birth) Birth Weight:  6 lb 8.8 oz (2971 g) Weight at Discharge:  Weight: 6 lb 3.5 oz (2821 g)               Date of Discharge:  12/21/2015 [facility] Weights   12/19/15 1209 12/20/15 0000 12/21/15 0115  Weight: 6 lb 8.8 oz (2971 g) 6 lb 7.4 oz (2930 g) 6 lb 3.5 oz (2821 g)  Last weight taken from location outside of [facility]:  6-4 on 12/24/15     Location:Pediatrician's office Weight today:  6-6    ________________________________________________________________________  Mother's Name: Napoleon Form Type of delivery:  vaginal Breastfeeding Experience:  First baby  Maternal Medications:  PNV'S  ________________________________________________________________________  Breastfeeding History (Post Discharge)  Frequency of breastfeeding:  8-12 times/24 hours Duration of feeding:  15-30 minutes one side  Supplementation          Breastmilk:  Volume 69ml Frequency:  2 bottles during night   Method:  Bottle,   Pumping  Type of pump:  Spectra Frequency:  8 times/24 hours  Post pumps or if baby receives a bottle Volume:  114ml    Infant Intake and Output Assessment  Voids:  6in 24 hrs.  Color:  Clear yellow Stools:  4 in 24 hrs.  Color:  [place] Shark and Yellow  ________________________________________________________________________  Maternal Breast Assessment  Breast:  Full Nipple:  Erect    _______________________________________________________________________ Feeding Assessment/Evaluation  Mom and 13 day old baby here for feeding assessment.  Mom was  started with a nipple shield in the hospital and continues to use.  Breasts are very full and milk supply is abundant.  Some firm areas noted in left breast but no s/s of mastitis.  Baby latched easily without nipple shield to left breast.  He nursed actively for 10-15 minutes and only transferred 2 mls.  24 mm nipple shield applied to evaluate for improved suck effectiveness and possibly increased transfer.  Baby latches deep with shield and nurses very well.  He transferred 10 mls after 10 more minutes.  Baby changed to right breast and nipple shield used.  Baby transferred 18 mls on right after 15-20 mls.  With mom's great supply and baby's latch/active nursing a much better milk transfer expected.  Baby came off content and showing no signs of hunger.  Written plan given and reviewed.  Feed April Peterson with any feeding cue, use good breast massage/compression during feeding, allow April Peterson to soften first breast then offer opposite breast, post pump both breasts after each feeding or when giving a bottle, offer Jack 30-60 mls of expressed milk after breastfeeds.  Follow up appointment 01/01/16 at 9:00 am.  Initial feeding assessment:  Infant's oral assessment:  Variance/lingual frenulum is short but baby seems to have good tongue movement.  Positioning:  Cross cradle Right breast /left breast LATCH documentation:  Latch:  2 = Grasps breast easily, tongue down, lips flanged, rhythmical sucking.  Audible swallowing:  2 = Spontaneous and intermittent  Type of nipple:  2 = Everted at rest and after stimulation  Comfort (Breast/Nipple):  2 = Soft / non-tender  Hold (Positioning):  2 = No assistance needed to correctly position  infant at breast  LATCH score:  10  Attached assessment:  Deep  Lips flanged:  Yes.    Lips untucked:  No.  Suck assessment:  Nutritive  Tools:  Nipple shield 24 mm Instructed on use and cleaning of tool:  Yes.    Pre-feed weight:  2892 g   Post-feed weight:  2922 g  Amount  transferred:  30 ml  12 mls from left and 18 mls from right Amount supplemented:  30 ml    Total amount pumped post feed:  100 mls

## 2016-01-01 ENCOUNTER — Ambulatory Visit (HOSPITAL_COMMUNITY)
Admission: RE | Admit: 2016-01-01 | Discharge: 2016-01-01 | Disposition: A | Discharge status: Home / Self Care | Payer: 59 | Source: Ambulatory Visit | Attending: Obstetrics and Gynecology | Admitting: Obstetrics and Gynecology

## 2016-01-01 DIAGNOSIS — Z029 Encounter for administrative examinations, unspecified: Secondary | ICD-10-CM | POA: Diagnosis not present

## 2016-01-01 NOTE — Lactation Note (Signed)
Lactation Consult  Mother's reason for visit:  Follow-up Visit Type:  OP Appointment Notes: April Peterson is concerned that April Peterson does not transfer enough at the breast so she has been pumping and bottle feeding for the most part. When she does BF she attaches April Peterson to the breast using a #24 NS. Initially April Peterson did not open wide to suck on a gloved finger but with jaw massage and tongue exercises he opened wider. His posterior palate is sensitive to contact and again with palate work he did better. He does not elevate his tongue well to compress the breast. FOB was shown how to do exercises.  Today he attached easily to the left breast with # 24 NS and suckled briefly. Stimulation was needed for him to stay engaged. He did not have a deep latch so the NS was removed. He had a much deeper latch with it.  Though mom has an abundance of milk he did not get into a good sucking pattern.  Between his effort and breast compression transfer was 1.3 oz. Breast was still full so he was positioned in a FB hold.  He attached but did not suckle.  He was placed on the right breast using a cross cradle hold but did not suckle.  FB was attempted because he previously fed better while lying on his left side.  He ate an additional 0.4 oz but again did not have a good suckling pattern.  He was then bottle fed 1 oz.  Plan: Oral exercises Mom is going to try and breastfeed twice a day.  She voiced that triple feeding was a lot of work.  Respected that concern and encouraged her to drain her breasts well 6 times in 24 hours which is an increase from 4 times.  Support groups Consult:  Follow-Up Lactation Consultant:  April Peterson  ________________________________________________________________________ April Peterson Name:  April Peterson Date of Birth:  12/19/2013 Pediatrician:  [facility]  Gender:  female Gestational Age: [redacted]w[redacted]d (At Birth) Birth Weight:  6 lb 8.8 oz (2971 g) Weight at Discharge:  Weight: 6 lb 3.5 oz (2821 g)                Date of Discharge:  12/21/2015      [facility] Weights   12/19/15 1209 12/20/15 0000 12/21/15 0115  Weight: 6 lb 8.8 oz (2971 g) 6 lb 7.4 oz (2930 g) 6 lb 3.5 oz (2821 g)  Last weight taken from location outside of [facility]:  6 #6 oz    Location:OP 12/25/2015 Weight today:  6# 15 oz    ________________________________________________________________________  Mother's Name: April Peterson Type of delivery:  vaginal Breastfeeding Experience:  First baby Maternal Medical Conditions:  none Maternal Medications:  PNV  ________________________________________________________________________  Breastfeeding History (Post Discharge)  Frequency of breastfeeding:  Once a day Duration of feeding:  60 minutes   Infant Intake and Output Assessment  Voids:  8+ in 24 hrs.  Color:  Clear yellow Stools:  5+ in 24 hrs.  Color:  Yellow

## 2017-01-06 DIAGNOSIS — N941 Unspecified dyspareunia: Secondary | ICD-10-CM | POA: Diagnosis not present

## 2017-01-08 ENCOUNTER — Ambulatory Visit (INDEPENDENT_AMBULATORY_CARE_PROVIDER_SITE_OTHER): Payer: BLUE CROSS/BLUE SHIELD | Admitting: Urgent Care

## 2017-01-08 ENCOUNTER — Encounter: Payer: Self-pay | Admitting: Urgent Care

## 2017-01-08 VITALS — BP 104/74 | HR 114 | Temp 98.3°F | Resp 18 | Ht 70.0 in | Wt 169.6 lb

## 2017-01-08 DIAGNOSIS — R52 Pain, unspecified: Secondary | ICD-10-CM | POA: Diagnosis not present

## 2017-01-08 DIAGNOSIS — R059 Cough, unspecified: Secondary | ICD-10-CM

## 2017-01-08 DIAGNOSIS — R05 Cough: Secondary | ICD-10-CM

## 2017-01-08 DIAGNOSIS — R6889 Other general symptoms and signs: Secondary | ICD-10-CM | POA: Diagnosis not present

## 2017-01-08 DIAGNOSIS — J029 Acute pharyngitis, unspecified: Secondary | ICD-10-CM

## 2017-01-08 LAB — POCT INFLUENZA A/B
Influenza A, POC: NEGATIVE
Influenza B, POC: NEGATIVE

## 2017-01-08 LAB — POCT RAPID STREP A (OFFICE): Rapid Strep A Screen: NEGATIVE

## 2017-01-08 MED ORDER — PSEUDOEPHEDRINE HCL ER 120 MG PO TB12: 120.0000 mg | ORAL_TABLET | Freq: Two times a day (BID) | ORAL | 3 refills | Status: DC

## 2017-01-08 MED ORDER — BENZONATATE 100 MG PO CAPS: 100.0000 mg | ORAL_CAPSULE | Freq: Three times a day (TID) | ORAL | 0 refills | Status: DC | PRN

## 2017-01-08 MED ORDER — HYDROCODONE-HOMATROPINE 5-1.5 MG/5ML PO SYRP: 5.0000 mL | ORAL_SOLUTION | Freq: Every evening | ORAL | 0 refills | Status: DC | PRN

## 2017-01-08 NOTE — Progress Notes (Signed)
  MRN: [id number] DOB: [birth year]  Subjective:   April Peterson is a 37 y.o. female presenting for 1 day history of body aches, sore throat, pounding headache. Has had a 3 week history of productive cough that elicits shob. Has tried NyQuil. Patient has 57 year old son but is not breastfeeding. Denies fever, sinus pain, ear pain, chest pain, n/v, abdominal pain, rashes. Denies smoking cigarettes. She did not get her flu shot this season.  April Peterson is not currently taking any medications and has No Known Allergies.  April Peterson denies past medical history and  has a past surgical history that includes Wisdom tooth extraction.  Objective:   Vitals: BP 104/74   Pulse (!) 114   Temp 98.3 F (36.8 C) (Oral)   Resp 18   Ht 5\' 10"  (1.778 m)   Wt 169 lb 9.6 oz (76.9 kg)   SpO2 97%   BMI 24.34 kg/m   Physical Exam  Constitutional: She is oriented to person, place, and time. She appears well-developed and well-nourished.  HENT:  TM's intact bilaterally, no effusions or erythema. Nasal turbinates pink, dry, nasal passages patent. No sinus tenderness. Oropharynx clear, mucous membranes moist.    Eyes: Right eye exhibits no discharge. Left eye exhibits no discharge.  Neck: Normal range of motion. Neck supple.  Cardiovascular: Normal rate, regular rhythm and intact distal pulses. Exam reveals no gallop and no friction rub.  No murmur heard. Pulmonary/Chest: No respiratory distress. She has no wheezes. She has no rales.  Lymphadenopathy:    She has no cervical adenopathy.  Neurological: She is alert and oriented to person, place, and time.  Skin: Skin is warm and dry.  Psychiatric: She has a normal mood and affect.   Results for orders placed or performed in visit on 01/08/17 (from the past 24 hour(s))  POCT rapid strep A     Status: None   Collection Time: 01/08/17  2:35 PM  Result Value Ref Range   Rapid Strep A Screen Negative Negative  POCT Influenza A/B     Status: None   Collection Time:  01/08/17  2:42 PM  Result Value Ref Range   Influenza A, POC Negative Negative   Influenza B, POC Negative Negative   Assessment and Plan :   Influenza-like symptoms - Plan: POCT rapid strep A, POCT Influenza A/B  Cough  Sore throat  Body aches   Will manage supportively for a viral illness. Strep culture pending. Return-to-clinic precautions discussed, patient verbalized understanding.   Jaynee Eagles, PA-C [facility] [phone] 01/08/2017  2:37 PM

## 2017-01-08 NOTE — Patient Instructions (Addendum)
Upper Respiratory Infection, Adult Most upper respiratory infections (URIs) are caused by a virus. A URI affects the nose, throat, and upper air passages. The most common type of URI is often called "the common cold." Follow these instructions at home:  Take medicines only as told by your doctor.  Gargle warm saltwater or take cough drops to comfort your throat as told by your doctor.  Use a warm mist humidifier or inhale steam from a shower to increase air moisture. This may make it easier to breathe.  Drink enough fluid to keep your pee (urine) clear or pale yellow.  Eat soups and other clear broths.  Have a healthy diet.  Rest as needed.  Go back to work when your fever is gone or your doctor says it is okay. ? You may need to stay home longer to avoid giving your URI to others. ? You can also wear a face mask and wash your hands often to prevent spread of the virus.  Use your inhaler more if you have asthma.  Do not use any tobacco products, including cigarettes, chewing tobacco, or electronic cigarettes. If you need help quitting, ask your doctor. Contact a doctor if:  You are getting worse, not better.  Your symptoms are not helped by medicine.  You have chills.  You are getting more short of breath.  You have brown or red mucus.  You have yellow or brown discharge from your nose.  You have pain in your face, especially when you bend forward.  You have a fever.  You have puffy (swollen) neck glands.  You have pain while swallowing.  You have white areas in the back of your throat. Get help right away if:  You have very bad or constant: ? Headache. ? Ear pain. ? Pain in your forehead, behind your eyes, and over your cheekbones (sinus pain). ? Chest pain.  You have long-lasting (chronic) lung disease and any of the following: ? Wheezing. ? Long-lasting cough. ? Coughing up blood. ? A change in your usual mucus.  You have a stiff neck.  You have  changes in your: ? Vision. ? Hearing. ? Thinking. ? Mood. This information is not intended to replace advice given to you by your health care provider. Make sure you discuss any questions you have with your health care provider. Document Released: 06/10/2007 Document Revised: 08/25/2015 Document Reviewed: 03/29/2013 Elsevier Interactive Patient Education  2018 [employer].      Influenza, Adult Influenza, more commonly known as "the flu," is a viral infection that primarily affects the respiratory tract. The respiratory tract includes organs that help you breathe, such as the lungs, nose, and throat. The flu causes many common cold symptoms, as well as a high fever and body aches. The flu spreads easily from person to person (is contagious). Getting a flu shot (influenza vaccination) every year is the best way to prevent influenza. What are the causes? Influenza is caused by a virus. You can catch the virus by:  Breathing in droplets from an infected person's cough or sneeze.  Touching something that was recently contaminated with the virus and then touching your mouth, nose, or eyes.  What increases the risk? The following factors may make you more likely to get the flu:  Not cleaning your hands frequently with soap and water or alcohol-based hand sanitizer.  Having close contact with many people during cold and flu season.  Touching your mouth, eyes, or nose without washing or sanitizing your  hands first.  Not drinking enough fluids or not eating a healthy diet.  Not getting enough sleep or exercise.  Being under a high amount of stress.  Not getting a yearly (annual) flu shot.  You may be at a higher risk of complications from the flu, such as a severe lung infection (pneumonia), if you:  Are over the age of 95.  Are pregnant.  Have a weakened disease-fighting system (immune system). You may have a weakened immune system if you: ? Have HIV or AIDS. ? Are  undergoing chemotherapy. ? Aretaking medicines that reduce the activity of (suppress) the immune system.  Have a long-term (chronic) illness, such as heart disease, kidney disease, diabetes, or lung disease.  Have a liver disorder.  Are obese.  Have anemia.  What are the signs or symptoms? Symptoms of this condition typically last 4-10 days and may include:  Fever.  Chills.  Headache, body aches, or muscle aches.  Sore throat.  Cough.  Runny or congested nose.  Chest discomfort and cough.  Poor appetite.  Weakness or tiredness (fatigue).  Dizziness.  Nausea or vomiting.  How is this diagnosed? This condition may be diagnosed based on your medical history and a physical exam. Your health care provider may do a nose or throat swab test to confirm the diagnosis. How is this treated? If influenza is detected early, you can be treated with antiviral medicine that can reduce the length of your illness and the severity of your symptoms. This medicine may be given by mouth (orally) or through an IV tube that is inserted in one of your veins. The goal of treatment is to relieve symptoms by taking care of yourself at home. This may include taking over-the-counter medicines, drinking plenty of fluids, and adding humidity to the air in your home. In some cases, influenza goes away on its own. Severe influenza or complications from influenza may be treated in a hospital. Follow these instructions at home:  Take over-the-counter and prescription medicines only as told by your health care provider.  Use a cool mist humidifier to add humidity to the air in your home. This can make breathing easier.  Rest as needed.  Drink enough fluid to keep your urine clear or pale yellow.  Cover your mouth and nose when you cough or sneeze.  Wash your hands with soap and water often, especially after you cough or sneeze. If soap and water are not available, use hand sanitizer.  Stay home  from work or school as told by your health care provider. Unless you are visiting your health care provider, try to avoid leaving home until your fever has been gone for 24 hours without the use of medicine.  Keep all follow-up visits as told by your health care provider. This is important. How is this prevented?  Getting an annual flu shot is the best way to avoid getting the flu. You may get the flu shot in late summer, fall, or winter. Ask your health care provider when you should get your flu shot.  Wash your hands often or use hand sanitizer often.  Avoid contact with people who are sick during cold and flu season.  Eat a healthy diet, drink plenty of fluids, get enough sleep, and exercise regularly. Contact a health care provider if:  You develop new symptoms.  You have: ? Chest pain. ? Diarrhea. ? A fever.  Your cough gets worse.  You produce more mucus.  You feel nauseous or you  vomit. Get help right away if:  You develop shortness of breath or difficulty breathing.  Your skin or nails turn a bluish color.  You have severe pain or stiffness in your neck.  You develop a sudden headache or sudden pain in your face or ear.  You cannot stop vomiting. This information is not intended to replace advice given to you by your health care provider. Make sure you discuss any questions you have with your health care provider. Document Released: 12/20/1999 Document Revised: 05/30/2015 Document Reviewed: 10/16/2014 Elsevier Interactive Patient Education  2017 [employer].     IF you received an x-ray today, you will receive an invoice from [facility]. Please contact [facility] at [phone] with questions or concerns regarding your invoice.   IF you received labwork today, you will receive an invoice from [place]. Please contact [facility] at [phone] with questions or concerns regarding your invoice.   Our billing staff will not be able to assist  you with questions regarding bills from these companies.  You will be contacted with the lab results as soon as they are available. The fastest way to get your results is to activate your My Chart account. Instructions are located on the last page of this paperwork. If you have not heard from [place] regarding the results in 2 weeks, please contact this office.

## 2017-03-02 DIAGNOSIS — Z6823 Body mass index (BMI) 23.0-23.9, adult: Secondary | ICD-10-CM | POA: Diagnosis not present

## 2017-03-02 DIAGNOSIS — Z01419 Encounter for gynecological examination (general) (routine) without abnormal findings: Secondary | ICD-10-CM | POA: Diagnosis not present

## 2017-12-10 DIAGNOSIS — Z3481 Encounter for supervision of other normal pregnancy, first trimester: Secondary | ICD-10-CM | POA: Diagnosis not present

## 2017-12-10 DIAGNOSIS — Z3685 Encounter for antenatal screening for Streptococcus B: Secondary | ICD-10-CM | POA: Diagnosis not present

## 2017-12-10 LAB — OB RESULTS CONSOLE RPR: RPR: NONREACTIVE

## 2017-12-10 LAB — OB RESULTS CONSOLE ANTIBODY SCREEN: Antibody Screen: NEGATIVE

## 2017-12-10 LAB — OB RESULTS CONSOLE HEPATITIS B SURFACE ANTIGEN: Hepatitis B Surface Ag: NEGATIVE

## 2017-12-10 LAB — OB RESULTS CONSOLE ABO/RH: RH Type: POSITIVE

## 2017-12-10 LAB — OB RESULTS CONSOLE RUBELLA ANTIBODY, IGM: Rubella: IMMUNE

## 2017-12-10 LAB — OB RESULTS CONSOLE HIV ANTIBODY (ROUTINE TESTING): HIV: NONREACTIVE

## 2017-12-16 DIAGNOSIS — Z23 Encounter for immunization: Secondary | ICD-10-CM | POA: Diagnosis not present

## 2017-12-16 DIAGNOSIS — Z113 Encounter for screening for infections with a predominantly sexual mode of transmission: Secondary | ICD-10-CM | POA: Diagnosis not present

## 2017-12-16 DIAGNOSIS — Z3401 Encounter for supervision of normal first pregnancy, first trimester: Secondary | ICD-10-CM | POA: Diagnosis not present

## 2017-12-16 LAB — OB RESULTS CONSOLE GC/CHLAMYDIA
Chlamydia: NEGATIVE
Gonorrhea: NEGATIVE

## 2018-01-03 DIAGNOSIS — Z3A13 13 weeks gestation of pregnancy: Secondary | ICD-10-CM | POA: Diagnosis not present

## 2018-01-03 DIAGNOSIS — O09521 Supervision of elderly multigravida, first trimester: Secondary | ICD-10-CM | POA: Diagnosis not present

## 2018-01-03 DIAGNOSIS — Z3682 Encounter for antenatal screening for nuchal translucency: Secondary | ICD-10-CM | POA: Diagnosis not present

## 2018-01-05 NOTE — L&D Delivery Note (Addendum)
Delivery Note At 3:43 PM a viable female was delivered via Vaginal, Spontaneous   Presentation DOA Apgars pending Weight pending Placenta pathology Cord PH not sent  Complications none   Anesthesia:   Episiotomy: None Lacerations:  Pinpoint superficial skin laceration Suture Repair: 4/0 vicryl Est. Blood Loss (mL): 148   It's a girl - "April Peterson" to join brother April Peterson!!   Mom to postpartum.  Baby to Couplet care / Skin to Skin.  Tyson Dense 07/04/2018, 4:28 PM

## 2018-06-17 LAB — OB RESULTS CONSOLE GBS: GBS: NEGATIVE

## 2018-06-21 ENCOUNTER — Telehealth (HOSPITAL_COMMUNITY): Payer: Self-pay | Admitting: *Deleted

## 2018-06-22 ENCOUNTER — Encounter (HOSPITAL_COMMUNITY): Payer: Self-pay | Admitting: *Deleted

## 2018-06-22 NOTE — Telephone Encounter (Signed)
Preadmission screen  

## 2018-06-23 ENCOUNTER — Encounter (HOSPITAL_COMMUNITY): Payer: Self-pay

## 2018-06-23 ENCOUNTER — Encounter: Payer: Self-pay | Admitting: Urgent Care

## 2018-06-30 ENCOUNTER — Other Ambulatory Visit: Payer: Self-pay

## 2018-06-30 ENCOUNTER — Other Ambulatory Visit (HOSPITAL_COMMUNITY)
Admission: RE | Admit: 2018-06-30 | Discharge: 2018-06-30 | Disposition: A | Discharge status: Home / Self Care | Payer: BC Managed Care – PPO | Source: Ambulatory Visit | Attending: Obstetrics & Gynecology | Admitting: Obstetrics & Gynecology

## 2018-06-30 DIAGNOSIS — Z1159 Encounter for screening for other viral diseases: Secondary | ICD-10-CM | POA: Diagnosis not present

## 2018-06-30 LAB — SARS CORONAVIRUS 2 (TAT 6-24 HRS): SARS Coronavirus 2: NEGATIVE

## 2018-06-30 NOTE — MAU Note (Signed)
Swab collected without difficulty. 

## 2018-07-01 ENCOUNTER — Telehealth (HOSPITAL_COMMUNITY): Payer: Self-pay

## 2018-07-01 NOTE — H&P (Signed)
April Peterson is a 38 y.o. female presenting for elective IOL. Pregnancy uncomplicated except AMA and EIF with normal genetics.  Hx NSVD of 6#8 female in 12/2015. Expecting a girl on panorama and [place]. OB History    Gravida  2   Para  1   Term  1   Preterm  0   AB  0   Living  1     SAB  0   TAB  0   Ectopic  0   Multiple      Live Births  1          Past Medical History:  Diagnosis Date  . Medical history non-contributory    Past Surgical History:  Procedure Laterality Date  . WISDOM TOOTH EXTRACTION     Family History: family history includes Diabetes in her father. Social History:  reports that she has never smoked. She has never used smokeless tobacco. She reports that she does not drink alcohol or use drugs.     Maternal Diabetes: No Genetic Screening: Normal Maternal Ultrasounds/Referrals: Isolated EIF (echogenic intracardiac focus) Fetal Ultrasounds or other Referrals:  None Maternal Substance Abuse:  No Significant Maternal Medications:  None Significant Maternal Lab Results:  None Other Comments:  None  ROS History   unknown if currently breastfeeding. Exam Physical Exam  (from office) NAD, A&O NWOB Abd soft, nondistended, gravid  Prenatal labs: ABO, Rh: A/Positive/-- (12/06 0000) Antibody: Negative (12/06 0000) Rubella: Immune (12/06 0000) RPR: Nonreactive (12/06 0000)  HBsAg: Negative (12/06 0000)  HIV: Non-reactive (12/06 0000)  GBS:   Negative  Assessment/Plan: 38 yo G2P1001 @ 39.2 wga presenting for eIOL. Pitocin/AROM. Expecting girl.  GBS neg.    Tyson Dense 07/01/2018, 12:16 PM

## 2018-07-04 ENCOUNTER — Inpatient Hospital Stay (HOSPITAL_COMMUNITY): Payer: BC Managed Care – PPO | Admitting: Anesthesiology

## 2018-07-04 ENCOUNTER — Other Ambulatory Visit: Payer: Self-pay

## 2018-07-04 ENCOUNTER — Inpatient Hospital Stay (HOSPITAL_COMMUNITY): Payer: BC Managed Care – PPO

## 2018-07-04 ENCOUNTER — Encounter (HOSPITAL_COMMUNITY): Payer: Self-pay | Admitting: *Deleted

## 2018-07-04 ENCOUNTER — Inpatient Hospital Stay (HOSPITAL_COMMUNITY)
Admission: AD | Admit: 2018-07-04 | Discharge: 2018-07-05 | DRG: 807 | Disposition: A | Discharge status: Home / Self Care | Payer: BC Managed Care – PPO | Attending: Obstetrics and Gynecology | Admitting: Obstetrics and Gynecology

## 2018-07-04 DIAGNOSIS — Z3A39 39 weeks gestation of pregnancy: Secondary | ICD-10-CM | POA: Diagnosis not present

## 2018-07-04 DIAGNOSIS — Z3490 Encounter for supervision of normal pregnancy, unspecified, unspecified trimester: Secondary | ICD-10-CM

## 2018-07-04 DIAGNOSIS — O26893 Other specified pregnancy related conditions, third trimester: Secondary | ICD-10-CM | POA: Diagnosis present

## 2018-07-04 LAB — CBC
HCT: 40.2 % (ref 36.0–46.0)
Hemoglobin: 12.9 g/dL (ref 12.0–15.0)
MCH: 29.7 pg (ref 26.0–34.0)
MCHC: 32.1 g/dL (ref 30.0–36.0)
MCV: 92.4 fL (ref 80.0–100.0)
Platelets: 217 10*3/uL (ref 150–400)
RBC: 4.35 MIL/uL (ref 3.87–5.11)
RDW: 13.2 % (ref 11.5–15.5)
WBC: 11.9 10*3/uL — ABNORMAL HIGH (ref 4.0–10.5)
nRBC: 0 % (ref 0.0–0.2)

## 2018-07-04 LAB — RPR: RPR Ser Ql: NONREACTIVE

## 2018-07-04 MED ORDER — PRENATAL MULTIVITAMIN CH
1.0000 | ORAL_TABLET | Freq: Every day | ORAL | Status: DC
  Administered 2018-07-05: 1 via ORAL
  Filled 2018-07-04: qty 1

## 2018-07-04 MED ORDER — LIDOCAINE HCL (PF) 1 % IJ SOLN: 30.0000 mL | INTRAMUSCULAR | Status: DC | PRN

## 2018-07-04 MED ORDER — OXYCODONE-ACETAMINOPHEN 5-325 MG PO TABS: 2.0000 | ORAL_TABLET | ORAL | Status: DC | PRN

## 2018-07-04 MED ORDER — LACTATED RINGERS IV SOLN: 500.0000 mL | Freq: Once | INTRAVENOUS | Status: DC

## 2018-07-04 MED ORDER — OXYTOCIN BOLUS FROM INFUSION
500.0000 mL | Freq: Once | INTRAVENOUS | Status: AC
  Administered 2018-07-04: 500 mL/h via INTRAVENOUS

## 2018-07-04 MED ORDER — OXYCODONE HCL 5 MG PO TABS: 10.0000 mg | ORAL_TABLET | ORAL | Status: DC | PRN

## 2018-07-04 MED ORDER — OXYTOCIN 40 UNITS IN NORMAL SALINE INFUSION - SIMPLE MED: 2.5000 [IU]/h | INTRAVENOUS | Status: DC

## 2018-07-04 MED ORDER — BUTORPHANOL TARTRATE 1 MG/ML IJ SOLN: 1.0000 mg | INTRAMUSCULAR | Status: DC | PRN

## 2018-07-04 MED ORDER — ACETAMINOPHEN 325 MG PO TABS
650.0000 mg | ORAL_TABLET | ORAL | Status: DC | PRN
  Administered 2018-07-05: 650 mg via ORAL
  Filled 2018-07-04: qty 2

## 2018-07-04 MED ORDER — SIMETHICONE 80 MG PO CHEW: 80.0000 mg | CHEWABLE_TABLET | ORAL | Status: DC | PRN

## 2018-07-04 MED ORDER — ONDANSETRON HCL 4 MG/2ML IJ SOLN: 4.0000 mg | Freq: Four times a day (QID) | INTRAMUSCULAR | Status: DC | PRN

## 2018-07-04 MED ORDER — DIBUCAINE (PERIANAL) 1 % EX OINT: 1.0000 "application " | TOPICAL_OINTMENT | CUTANEOUS | Status: DC | PRN

## 2018-07-04 MED ORDER — OXYTOCIN 40 UNITS IN NORMAL SALINE INFUSION - SIMPLE MED
1.0000 m[IU]/min | INTRAVENOUS | Status: DC
  Administered 2018-07-04: 2 m[IU]/min via INTRAVENOUS
  Filled 2018-07-04: qty 1000

## 2018-07-04 MED ORDER — FENTANYL CITRATE (PF) 100 MCG/2ML IJ SOLN
INTRAMUSCULAR | Status: DC | PRN
  Administered 2018-07-04: 15 ug via INTRATHECAL

## 2018-07-04 MED ORDER — PHENYLEPHRINE 40 MCG/ML (10ML) SYRINGE FOR IV PUSH (FOR BLOOD PRESSURE SUPPORT): 80.0000 ug | PREFILLED_SYRINGE | INTRAVENOUS | Status: DC | PRN

## 2018-07-04 MED ORDER — FENTANYL CITRATE (PF) 100 MCG/2ML IJ SOLN: 12.0000 ug | Freq: Once | INTRAMUSCULAR | Status: DC

## 2018-07-04 MED ORDER — OXYCODONE-ACETAMINOPHEN 5-325 MG PO TABS: 1.0000 | ORAL_TABLET | ORAL | Status: DC | PRN

## 2018-07-04 MED ORDER — WITCH HAZEL-GLYCERIN EX PADS: 1.0000 "application " | MEDICATED_PAD | CUTANEOUS | Status: DC | PRN

## 2018-07-04 MED ORDER — TETANUS-DIPHTH-ACELL PERTUSSIS 5-2.5-18.5 LF-MCG/0.5 IM SUSP: 0.5000 mL | Freq: Once | INTRAMUSCULAR | Status: DC

## 2018-07-04 MED ORDER — DIPHENHYDRAMINE HCL 25 MG PO CAPS: 25.0000 mg | ORAL_CAPSULE | Freq: Four times a day (QID) | ORAL | Status: DC | PRN

## 2018-07-04 MED ORDER — DIPHENHYDRAMINE HCL 50 MG/ML IJ SOLN: 12.5000 mg | INTRAMUSCULAR | Status: DC | PRN

## 2018-07-04 MED ORDER — LACTATED RINGERS IV SOLN: 500.0000 mL | INTRAVENOUS | Status: DC | PRN

## 2018-07-04 MED ORDER — FENTANYL-BUPIVACAINE-NACL 0.5-0.125-0.9 MG/250ML-% EP SOLN
12.0000 mL/h | EPIDURAL | Status: DC | PRN
  Filled 2018-07-04: qty 250

## 2018-07-04 MED ORDER — LIDOCAINE HCL (PF) 1 % IJ SOLN
INTRAMUSCULAR | Status: DC | PRN
  Administered 2018-07-04 (×2): 4 mL via EPIDURAL

## 2018-07-04 MED ORDER — ACETAMINOPHEN 325 MG PO TABS: 650.0000 mg | ORAL_TABLET | ORAL | Status: DC | PRN

## 2018-07-04 MED ORDER — FENTANYL CITRATE (PF) 100 MCG/2ML IJ SOLN
INTRAMUSCULAR | Status: AC
  Filled 2018-07-04: qty 2

## 2018-07-04 MED ORDER — COCONUT OIL OIL
1.0000 "application " | TOPICAL_OIL | Status: DC | PRN
  Administered 2018-07-04: 1 via TOPICAL

## 2018-07-04 MED ORDER — IBUPROFEN 600 MG PO TABS
600.0000 mg | ORAL_TABLET | Freq: Four times a day (QID) | ORAL | Status: DC
  Administered 2018-07-04 – 2018-07-05 (×4): 600 mg via ORAL
  Filled 2018-07-04 (×4): qty 1

## 2018-07-04 MED ORDER — ONDANSETRON HCL 4 MG PO TABS: 4.0000 mg | ORAL_TABLET | ORAL | Status: DC | PRN

## 2018-07-04 MED ORDER — EPHEDRINE 5 MG/ML INJ: 10.0000 mg | INTRAVENOUS | Status: DC | PRN

## 2018-07-04 MED ORDER — TERBUTALINE SULFATE 1 MG/ML IJ SOLN: 0.2500 mg | Freq: Once | INTRAMUSCULAR | Status: DC | PRN

## 2018-07-04 MED ORDER — LACTATED RINGERS IV SOLN
INTRAVENOUS | Status: DC
  Administered 2018-07-04 (×2): via INTRAVENOUS

## 2018-07-04 MED ORDER — HYDROXYZINE HCL 50 MG PO TABS: 50.0000 mg | ORAL_TABLET | Freq: Four times a day (QID) | ORAL | Status: DC | PRN

## 2018-07-04 MED ORDER — LACTATED RINGERS IV SOLN
500.0000 mL | Freq: Once | INTRAVENOUS | Status: AC
  Administered 2018-07-04: 500 mL via INTRAVENOUS

## 2018-07-04 MED ORDER — SODIUM CHLORIDE (PF) 0.9 % IJ SOLN
INTRAMUSCULAR | Status: DC | PRN
  Administered 2018-07-04: 12 mL/h via EPIDURAL

## 2018-07-04 MED ORDER — ONDANSETRON HCL 4 MG/2ML IJ SOLN: 4.0000 mg | INTRAMUSCULAR | Status: DC | PRN

## 2018-07-04 MED ORDER — OXYCODONE HCL 5 MG PO TABS: 5.0000 mg | ORAL_TABLET | ORAL | Status: DC | PRN

## 2018-07-04 MED ORDER — SENNOSIDES-DOCUSATE SODIUM 8.6-50 MG PO TABS
2.0000 | ORAL_TABLET | ORAL | Status: DC
  Administered 2018-07-04: 2 via ORAL
  Filled 2018-07-04: qty 2

## 2018-07-04 MED ORDER — SOD CITRATE-CITRIC ACID 500-334 MG/5ML PO SOLN: 30.0000 mL | ORAL | Status: DC | PRN

## 2018-07-04 MED ORDER — BENZOCAINE-MENTHOL 20-0.5 % EX AERO: 1.0000 "application " | INHALATION_SPRAY | CUTANEOUS | Status: DC | PRN

## 2018-07-04 MED ORDER — ZOLPIDEM TARTRATE 5 MG PO TABS: 5.0000 mg | ORAL_TABLET | Freq: Every evening | ORAL | Status: DC | PRN

## 2018-07-04 NOTE — Anesthesia Procedure Notes (Signed)
Combined Spinal Epidural Patient location during procedure: OB Start time: 07/04/2018 11:24 AM End time: 07/04/2018 11:27 AM  Staffing Anesthesiologist: Brennan Bailey, MD Performed: anesthesiologist   Preanesthetic Checklist Completed: patient identified, pre-op evaluation, timeout performed, IV checked, risks and benefits discussed and monitors and equipment checked  Epidural Patient position: sitting Prep: site prepped and draped and DuraPrep Patient monitoring: continuous pulse ox, blood pressure, heart rate and cardiac monitor Approach: midline Location: L3-L4 Injection technique: LOR air  Needle:  Needle type: Tuohy  Needle gauge: 17 G Needle length: 9 cm Needle insertion depth: 6 cm Catheter type: closed end flexible Catheter size: 19 Gauge Catheter at skin depth: 11 cm Test dose: negative and Other (1% lidocaine)  Assessment Events: blood not aspirated, injection not painful, no injection resistance, negative IV test and no paresthesia  Additional Notes Patient identified. Risks, benefits, and alternatives discussed with patient including but not limited to bleeding, infection, nerve damage, paralysis, failed block, incomplete pain control, headache, blood pressure changes, nausea, vomiting, reactions to medication, itching, and postpartum back pain. Confirmed with bedside nurse the patient's most recent platelet count. Confirmed with patient that they are not currently taking any anticoagulation, have any bleeding history, or any family history of bleeding disorders. Patient expressed understanding and wished to proceed. All questions were answered. Sterile technique was used throughout the entire procedure. Please see nursing notes for vital signs.   Pt describes hx of sacral sparing with previous epidural. Discussed CSE and pt gave consent for procedure. 25g Whitacre spinal needle introduced through Tuohy with clear CSF return. Intrathecal fentanyl given as charted.  Spinal needle removed and epidural catheter threaded easily with negative aspiration for heme or CSF. Pt quickly much more comfortable with contractions. Warning signs of high block given to the patient including shortness of breath, tingling/numbness in hands, complete motor block, or any concerning symptoms with instructions to call for help. Patient was given instructions on fall risk and not to get out of bed. All questions and concerns addressed with instructions to call with any issues or inadequate analgesia.  Reason for block:procedure for pain

## 2018-07-04 NOTE — Progress Notes (Addendum)
S: No c/o. No updates to H&P.  O: AFVSS AROM clear fluid - 4.5/50/-2  A/P: Start pitocin "[place]"

## 2018-07-04 NOTE — Anesthesia Preprocedure Evaluation (Signed)
Anesthesia Evaluation  Patient identified by MRN, date of birth, ID band Patient awake    Reviewed: Allergy & Precautions, Patient's Chart, lab work & pertinent test results  History of Anesthesia Complications Negative for: history of anesthetic complications  Airway Mallampati: II  TM Distance: >3 FB Neck ROM: Full    Dental no notable dental hx.    Pulmonary neg pulmonary ROS,    Pulmonary exam normal        Cardiovascular negative cardio ROS Normal cardiovascular exam     Neuro/Psych negative neurological ROS     GI/Hepatic negative GI ROS, Neg liver ROS,   Endo/Other  negative endocrine ROS  Renal/GU negative Renal ROS     Musculoskeletal negative musculoskeletal ROS (+)   Abdominal   Peds  Hematology negative hematology ROS (+)   Anesthesia Other Findings Day of surgery medications reviewed with the patient.  Reproductive/Obstetrics (+) Pregnancy                             Anesthesia Physical Anesthesia Plan  ASA: II  Anesthesia Plan: Epidural   Post-op Pain Management:    Induction:   PONV Risk Score and Plan: Treatment may vary due to age or medical condition  Airway Management Planned: Natural Airway  Additional Equipment:   Intra-op Plan:   Post-operative Plan:   Informed Consent: I have reviewed the patients History and Physical, chart, labs and discussed the procedure including the risks, benefits and alternatives for the proposed anesthesia with the patient or authorized representative who has indicated his/her understanding and acceptance.       Plan Discussed with:   Anesthesia Plan Comments:         Anesthesia Quick Evaluation

## 2018-07-05 LAB — TYPE AND SCREEN
ABO/RH(D): A POS
Antibody Screen: NEGATIVE

## 2018-07-05 LAB — CBC
HCT: 36.4 % (ref 36.0–46.0)
Hemoglobin: 12 g/dL (ref 12.0–15.0)
MCH: 30.1 pg (ref 26.0–34.0)
MCHC: 33 g/dL (ref 30.0–36.0)
MCV: 91.2 fL (ref 80.0–100.0)
Platelets: 189 10*3/uL (ref 150–400)
RBC: 3.99 MIL/uL (ref 3.87–5.11)
RDW: 13.1 % (ref 11.5–15.5)
WBC: 13.2 10*3/uL — ABNORMAL HIGH (ref 4.0–10.5)
nRBC: 0 % (ref 0.0–0.2)

## 2018-07-05 LAB — ABO/RH: ABO/RH(D): A POS

## 2018-07-05 MED ORDER — IBUPROFEN 600 MG PO TABS: 600.0000 mg | ORAL_TABLET | Freq: Four times a day (QID) | ORAL | 0 refills | Status: DC

## 2018-07-05 NOTE — Lactation Note (Signed)
This note was copied from a baby's chart. Lactation Consultation Note Baby 23 hrs old. Mom stated that baby has latched well several times. Baby is not interested at this time. Mom attempted to feed in cross cradle position. Mom has short shaft nipples. Everts well w/more stimulation. Mom massaging breast, demonstrated hand expression w/dot of colostrum noted. Shells given to wear in am, hand pump given to pre-pump if needed. Baby had large clear water emesis. Several time afterwards baby acted as if going to spit up. Newborn behavior, feeding habits, STS, I&O, breast massage, milk storage, positioning and support discussed.  Mom attempted to BF her 1st child but states it was terrible. Had difficulty latching causing mom to be very stressed. Mom pumped and bottle fed 2 weeks. Mom states she is going to mostly pump when she gets home when her milk comes in but will latch her as long as she wants to latch. Lactation brochure given, Encouraged to call for assistance as needed.  Patient Name: April Peterson [id number] Date: 07/05/2018 Reason for consult: Initial assessment;Term   Maternal Data Has patient been taught Hand Expression?: Yes Does the patient have breastfeeding experience prior to this delivery?: Yes  Feeding Feeding Type: Breast Fed  LATCH Score Latch: Too sleepy or reluctant, no latch achieved, no sucking elicited.  Audible Swallowing: None  Type of Nipple: Everted at rest and after stimulation(short shaft)  Comfort (Breast/Nipple): Soft / non-tender  Hold (Positioning): No assistance needed to correctly position infant at breast.  LATCH Score: 6  Interventions Interventions: Breast feeding basics reviewed;Support pillows;Skin to skin;Position options;Breast massage;Hand express;Pre-pump if needed;Shells;Hand pump;Breast compression  Lactation Tools Discussed/Used Tools: Shells;Pump Shell Type: Inverted Breast pump type: Manual WIC Program:  No   Consult Status Consult Status: Follow-up Date: 07/05/18 Follow-up type: In-patient    April Peterson, [place]ta [place] 07/05/2018, 5:11 AM

## 2018-07-05 NOTE — Lactation Note (Signed)
This note was copied from a baby's chart. Lactation Consultation Note  Patient Name: April Peterson [id number] Date: 07/05/2018 Reason for consult: Follow-up assessment;Other (Comment);Infant weight loss;Term(AMA)  52 hours old FT female who is being exclusively BF by her mother, she's a P2 but not very experienced BF, she told LC she had a really hard time latching baby to the breast with her first one, but that this baby is completely different. Mom and baby are going home today, baby is at 4% weight loss.   Mom requested assistance with the latch before she left the hospital, she wanted to make sure BF is going well. LC assisted with hand expression prior latching and she was able to easily get several droplets of colostrum from both breasts. NT was getting ready to do the hearing screen, and when baby was getting hooked up to the cables she woke up crying and would not latch to the breast at first when [facility] took her STS in football position. After a few attempts, she eventually latched but required constant stimulation to sustain sucking; she was very sleepy. Only a few audible swallows heard during the 7 minutes feeding, baby self released from the breast when she fell asleep.  Reviewed discharge instructions, engorgement prevention and treatment, treatment for sore nipples, red flags on when to call baby's pediatrician and pumping schedule. Mom has a DEBP at home and advised her to use it after feedings if she feels like baby is not getting enough, and to feed baby any amount of EBM she may get through pumping in addition to feedings at the breast; 8-12 times/24 hours or sooner if feeding cues are present. Mom aware that baby my cluster feed tonight. She reported all questions and concerns were answered, she's aware of [facility] OP services and will contact PRN.    Maternal Data    Feeding Feeding Type: Breast Fed  LATCH Score Latch: Repeated attempts needed to sustain latch, nipple held in  mouth throughout feeding, stimulation needed to elicit sucking reflex.  Audible Swallowing: A few with stimulation  Type of Nipple: Everted at rest and after stimulation  Comfort (Breast/Nipple): Soft / non-tender  Hold (Positioning): Assistance needed to correctly position infant at breast and maintain latch.  LATCH Score: 7  Interventions Interventions: Breast feeding basics reviewed;Assisted with latch;Skin to skin;Breast massage;Hand express;Breast compression;Adjust position;Support pillows  Lactation Tools Discussed/Used     Consult Status Consult Status: Complete Date: 07/05/18 Follow-up type: Call as needed    [place] 07/05/2018, 4:35 PM

## 2018-07-05 NOTE — Discharge Summary (Signed)
Obstetric Discharge Summary Reason for Admission: induction of labor Prenatal Procedures: none Intrapartum Procedures: spontaneous vaginal delivery Postpartum Procedures: none Complications-Operative and Postpartum: superficial laceration Hemoglobin  Date Value Ref Range Status  07/05/2018 12.0 12.0 - 15.0 g/dL Final   HCT  Date Value Ref Range Status  07/05/2018 36.4 36.0 - 46.0 % Final    Physical Exam:  General: alert and cooperative Lochia: appropriate Uterine Fundus: firm Incision: healing well, no significant drainage, no dehiscence DVT Evaluation: No evidence of DVT seen on physical exam. Negative Homan's sign. No cords or calf tenderness.  Discharge Diagnoses: Term Pregnancy-delivered  Discharge Information: Date: 07/05/2018 Activity: pelvic rest Diet: routine Medications: PNV and Ibuprofen Condition: stable Instructions: refer to practice specific booklet Discharge to: home   Newborn Data: Live born female  Birth Weight: 7 lb 8.1 oz (3405 g) APGAR: 64, 9  Newborn Delivery   Birth date/time: 07/04/2018 15:43:00 Delivery type: Vaginal, Spontaneous      Home with mother.  Linda Hedges 07/05/2018, 9:46 AM

## 2018-07-05 NOTE — Progress Notes (Signed)
Post Partum Day 1 Subjective: no complaints, up ad lib, voiding and tolerating PO  Objective: Blood pressure 97/66, pulse 71, temperature 97.9 F (36.6 C), temperature source Oral, resp. rate 18, height 5\' 10"  (1.778 m), weight 90.7 kg, SpO2 98 %, unknown if currently breastfeeding.  Physical Exam:  General: alert, cooperative and appears stated age Lochia: appropriate Uterine Fundus: firm Incision: healing well, no significant drainage, no dehiscence DVT Evaluation: No evidence of DVT seen on physical exam. Negative Homan's sign. No cords or calf tenderness.  Recent Labs    07/04/18 0735 07/05/18 0515  HGB 12.9 12.0  HCT 40.2 36.4    Assessment/Plan: Discharge home and Breastfeeding   LOS: 1 day   Linda Hedges 07/05/2018, 9:43 AM

## 2018-07-05 NOTE — Anesthesia Postprocedure Evaluation (Signed)
Anesthesia Post Note  Patient: April Peterson  Procedure(s) Performed: AN AD [place]     Patient location during evaluation: Mother Baby Anesthesia Type: Epidural and Combined Spinal/Epidural Level of consciousness: awake and alert Pain management: pain level controlled Vital Signs Assessment: post-procedure vital signs reviewed and stable Respiratory status: spontaneous breathing, nonlabored ventilation and respiratory function stable Cardiovascular status: stable Postop Assessment: no headache, no backache, epidural receding, patient able to bend at knees, no apparent nausea or vomiting, able to ambulate and adequate PO intake Anesthetic complications: no    Last Vitals:  Vitals:   07/05/18 0218 07/05/18 0602  BP: 105/60 97/66  Pulse: 72 71  Resp: 18 18  Temp: 36.7 C 36.6 C  SpO2: 98% 98%    Last Pain:  Vitals:   07/05/18 0602  TempSrc: Oral  PainSc:    Pain Goal:                   Talitha Givens

## 2018-07-05 NOTE — Discharge Instructions (Signed)
Call MD for T>100.4, heavy vaginal bleeding, severe abdominal pain, or respiratory distress.  Call office to schedule postpartum visit in 6 weeks.  Pelvic rest x 6 weeks.   

## 2018-07-13 ENCOUNTER — Inpatient Hospital Stay (HOSPITAL_COMMUNITY)
Admission: EM | Admit: 2018-07-13 | Discharge: 2018-07-20 | DRG: 769 | Disposition: A | Discharge status: Home / Self Care | Payer: BC Managed Care – PPO | Attending: Neurological Surgery | Admitting: Neurological Surgery

## 2018-07-13 ENCOUNTER — Emergency Department (HOSPITAL_COMMUNITY): Payer: BC Managed Care – PPO

## 2018-07-13 ENCOUNTER — Other Ambulatory Visit: Payer: Self-pay

## 2018-07-13 DIAGNOSIS — Z9889 Other specified postprocedural states: Secondary | ICD-10-CM

## 2018-07-13 DIAGNOSIS — R569 Unspecified convulsions: Secondary | ICD-10-CM | POA: Diagnosis present

## 2018-07-13 DIAGNOSIS — O9089 Other complications of the puerperium, not elsewhere classified: Secondary | ICD-10-CM | POA: Diagnosis not present

## 2018-07-13 DIAGNOSIS — D496 Neoplasm of unspecified behavior of brain: Secondary | ICD-10-CM | POA: Diagnosis not present

## 2018-07-13 DIAGNOSIS — D329 Benign neoplasm of meninges, unspecified: Secondary | ICD-10-CM

## 2018-07-13 DIAGNOSIS — E860 Dehydration: Secondary | ICD-10-CM | POA: Diagnosis present

## 2018-07-13 DIAGNOSIS — R471 Dysarthria and anarthria: Secondary | ICD-10-CM | POA: Diagnosis not present

## 2018-07-13 DIAGNOSIS — D320 Benign neoplasm of cerebral meninges: Secondary | ICD-10-CM | POA: Diagnosis present

## 2018-07-13 DIAGNOSIS — Z1159 Encounter for screening for other viral diseases: Secondary | ICD-10-CM

## 2018-07-13 DIAGNOSIS — Z86018 Personal history of other benign neoplasm: Secondary | ICD-10-CM

## 2018-07-13 DIAGNOSIS — R4701 Aphasia: Secondary | ICD-10-CM | POA: Diagnosis present

## 2018-07-13 DIAGNOSIS — Z833 Family history of diabetes mellitus: Secondary | ICD-10-CM

## 2018-07-13 DIAGNOSIS — G8191 Hemiplegia, unspecified affecting right dominant side: Secondary | ICD-10-CM | POA: Diagnosis present

## 2018-07-13 DIAGNOSIS — G9389 Other specified disorders of brain: Secondary | ICD-10-CM

## 2018-07-13 LAB — DIFFERENTIAL
Abs Immature Granulocytes: 0.04 10*3/uL (ref 0.00–0.07)
Basophils Absolute: 0 10*3/uL (ref 0.0–0.1)
Basophils Relative: 0 %
Eosinophils Absolute: 0.1 10*3/uL (ref 0.0–0.5)
Eosinophils Relative: 1 %
Immature Granulocytes: 0 %
Lymphocytes Relative: 26 %
Lymphs Abs: 3.2 10*3/uL (ref 0.7–4.0)
Monocytes Absolute: 1 10*3/uL (ref 0.1–1.0)
Monocytes Relative: 8 %
Neutro Abs: 8 10*3/uL — ABNORMAL HIGH (ref 1.7–7.7)
Neutrophils Relative %: 65 %

## 2018-07-13 LAB — COMPREHENSIVE METABOLIC PANEL
ALT: 27 U/L (ref 0–44)
AST: 23 U/L (ref 15–41)
Albumin: 3.4 g/dL — ABNORMAL LOW (ref 3.5–5.0)
Alkaline Phosphatase: 89 U/L (ref 38–126)
Anion gap: 9 (ref 5–15)
BUN: 17 mg/dL (ref 6–20)
CO2: 22 mmol/L (ref 22–32)
Calcium: 9.4 mg/dL (ref 8.9–10.3)
Chloride: 109 mmol/L (ref 98–111)
Creatinine, Ser: 0.73 mg/dL (ref 0.44–1.00)
GFR calc Af Amer: >60 mL/min (ref >60)
GFR calc non Af Amer: >60 mL/min (ref >60)
Glucose, Bld: 100 mg/dL — ABNORMAL HIGH (ref 70–99)
Potassium: 4.2 mmol/L (ref 3.5–5.1)
Sodium: 140 mmol/L (ref 135–145)
Total Bilirubin: 0.8 mg/dL (ref 0.3–1.2)
Total Protein: 6.9 g/dL (ref 6.5–8.1)

## 2018-07-13 LAB — CBC
HCT: 42.9 % (ref 36.0–46.0)
Hemoglobin: 14 g/dL (ref 12.0–15.0)
MCH: 30.1 pg (ref 26.0–34.0)
MCHC: 32.6 g/dL (ref 30.0–36.0)
MCV: 92.3 fL (ref 80.0–100.0)
Platelets: 286 10*3/uL (ref 150–400)
RBC: 4.65 MIL/uL (ref 3.87–5.11)
RDW: 12.8 % (ref 11.5–15.5)
WBC: 12.4 10*3/uL — ABNORMAL HIGH (ref 4.0–10.5)
nRBC: 0 % (ref 0.0–0.2)

## 2018-07-13 LAB — I-STAT CHEM 8, ED
BUN: 17 mg/dL (ref 6–20)
Calcium, Ion: 1.18 mmol/L (ref 1.15–1.40)
Chloride: 109 mmol/L (ref 98–111)
Creatinine, Ser: 0.7 mg/dL (ref 0.44–1.00)
Glucose, Bld: 99 mg/dL (ref 70–99)
HCT: 43 % (ref 36.0–46.0)
Hemoglobin: 14.6 g/dL (ref 12.0–15.0)
Potassium: 4 mmol/L (ref 3.5–5.1)
Sodium: 141 mmol/L (ref 135–145)
TCO2: 24 mmol/L (ref 22–32)

## 2018-07-13 LAB — CBG MONITORING, ED: Glucose-Capillary: 185 mg/dL — ABNORMAL HIGH (ref 70–99)

## 2018-07-13 LAB — APTT: aPTT: 37 seconds — ABNORMAL HIGH (ref 24–36)

## 2018-07-13 LAB — I-STAT BETA HCG BLOOD, ED (MC, WL, AP ONLY): I-stat hCG, quantitative: 5.3 m[IU]/mL — ABNORMAL HIGH (ref <5)

## 2018-07-13 LAB — PROTIME-INR
INR: 1.1 (ref 0.8–1.2)
Prothrombin Time: 14 seconds (ref 11.4–15.2)

## 2018-07-13 MED ORDER — LORAZEPAM 2 MG/ML IJ SOLN
1.0000 mg | Freq: Once | INTRAMUSCULAR | Status: AC
  Administered 2018-07-13: 1 mg via INTRAVENOUS
  Filled 2018-07-13: qty 1

## 2018-07-13 MED ORDER — SODIUM CHLORIDE 0.9% FLUSH: 3.0000 mL | Freq: Once | INTRAVENOUS | Status: DC

## 2018-07-13 MED ORDER — IOHEXOL 350 MG/ML SOLN
75.0000 mL | Freq: Once | INTRAVENOUS | Status: AC | PRN
  Administered 2018-07-13: 75 mL via INTRAVENOUS

## 2018-07-13 NOTE — ED Provider Notes (Cosign Needed Addendum)
[facility] EMERGENCY DEPARTMENT Provider Note   CSN: [id number] Arrival date & time: 07/13/18  2121  An emergency department physician performed an initial assessment on this suspected stroke patient at 2145.  History   Chief Complaint Chief Complaint  Patient presents with   Code Stroke    HPI April Peterson is a 38 y.o. female.     38 y.o female with a no PMH presents to the ED as stroke alert. History obtained from husband reports he last saw the patient this morning at 8 AM prior to going on a trip to [place].  He reports he spoke to patient around 3 PM this afternoon, noted patient to be slightly off thought she was likely distracted.  According to his mother-in-law patient was at home with a noted grandson, did look tired.  He consulted with his primary care physician who reported patient might be dehydrated, he continued to give her fluids while at home.  They noted patient was not improving.  He reports patient is currently postpartum, she had a vaginal delivery about 10 days ago.  Able to obtain further history from patient.  The history is provided by the patient, medical records and the spouse.    Past Medical History:  Diagnosis Date   Medical history non-contributory     Patient Active Problem List   Diagnosis Date Noted   Pregnant 07/04/2018   Normal labor 12/19/2015    Past Surgical History:  Procedure Laterality Date   WISDOM TOOTH EXTRACTION       OB History    Gravida  2   Para  2   Term  2   Preterm  0   AB  0   Living  2     SAB  0   TAB  0   Ectopic  0   Multiple  0   Live Births  2            Home Medications    Prior to Admission medications   Medication Sig Start Date End Date Taking? Authorizing Provider  ibuprofen (ADVIL) 600 MG tablet Take 1 tablet (600 mg total) by mouth every 6 (six) hours. Patient not taking: Reported on 07/13/2018 07/05/18   Linda Hedges, DO    Family History Family  History  Problem Relation Age of Onset   Diabetes Father     Social History Social History   Tobacco Use   Smoking status: Never Smoker   Smokeless tobacco: Never Used  Substance Use Topics   Alcohol use: No   Drug use: No     Allergies   Patient has no known allergies.   Review of Systems Review of Systems  Unable to perform ROS: Acuity of condition     Physical Exam Updated Vital Signs BP 125/76    Pulse 93    Temp 98.3 F (36.8 C) (Oral)    Resp 17    SpO2 97%   Physical Exam Vitals signs and nursing note reviewed.  Constitutional:      Appearance: Normal appearance.  HENT:     Head: Normocephalic and atraumatic.     Mouth/Throat:     Mouth: Mucous membranes are dry.  Eyes:     Pupils: Pupils are equal, round, and reactive to light.  Neck:     Musculoskeletal: Normal range of motion and neck supple.  Cardiovascular:     Rate and Rhythm: Normal rate and regular rhythm.     Pulses:  Normal pulses.     Heart sounds: Normal heart sounds.  Pulmonary:     Effort: Pulmonary effort is normal.     Breath sounds: Normal breath sounds.  Abdominal:     General: Abdomen is flat.  Neurological:     Mental Status: She is alert. She is disoriented.     Cranial Nerves: Dysarthria present. No facial asymmetry.     Coordination: Coordination abnormal. Finger-Nose-Finger Test abnormal.      ED Treatments / Results  Labs (all labs ordered are listed, but only abnormal results are displayed) Labs Reviewed  APTT - Abnormal; Notable for the following components:      Result Value   aPTT 37 (*)    All other components within normal limits  CBC - Abnormal; Notable for the following components:   WBC 12.4 (*)    All other components within normal limits  DIFFERENTIAL - Abnormal; Notable for the following components:   Neutro Abs 8.0 (*)    All other components within normal limits  COMPREHENSIVE METABOLIC PANEL - Abnormal; Notable for the following components:     Glucose, Bld 100 (*)    Albumin 3.4 (*)    All other components within normal limits  CBG MONITORING, ED - Abnormal; Notable for the following components:   Glucose-Capillary 185 (*)    All other components within normal limits  I-STAT BETA HCG BLOOD, ED ([facility], [facility], [facility] ONLY) - Abnormal; Notable for the following components:   I-stat hCG, quantitative 5.3 (*)    All other components within normal limits  PROTIME-INR  I-STAT CHEM 8, ED    EKG None  Radiology Ct Angio Head W Or Wo Contrast  Result Date: 07/13/2018 CLINICAL DATA:  Initial evaluation for acute altered mental status. EXAM: CT HEAD WITHOUT CONTRAST CT ANGIOGRAPHY HEAD AND NECK CT HEAD VENOGRAM TECHNIQUE: Multidetector CT imaging of the head and neck was performed using the standard protocol during bolus administration of intravenous contrast. Multiplanar CT image reconstructions and MIPs were obtained to evaluate the vascular anatomy. Carotid stenosis measurements (when applicable) are obtained utilizing NASCET criteria, using the distal internal carotid diameter as the denominator. CONTRAST:  12mL OMNIPAQUE IOHEXOL 350 MG/ML SOLN COMPARISON:  None available. FINDINGS: CT HEAD FINDINGS Brain: There is a large homogeneous Lea hyperdense lesion positioned at the left frontal lobe that measures 5.2 x 5.3 x 4.8 cm (transverse by AP by craniocaudad), felt to be most consistent with an underlying mass lesion, likely meningioma. Associated mild localized vasogenic edema. Regional mass effect with up to 7 mm of left-to-right shift. Left lateral ventricle is partially effaced. No hydrocephalus or ventricular trapping at this time. Basilar cisterns remain patent. No other mass lesion. No definite acute intracranial hemorrhage. No acute large vessel territory infarct. No extra-axial fluid collection. Vascular: No hyperdense vessel. Skull: Scalp soft tissues demonstrate no acute abnormality. Subtle permeated of change noted at the left frontal  calvarium at the level of the left frontal mass (series 4, image 69). Few scattered prominent dural calcifications noted. Sinuses/Orbits: Globes and orbital soft tissues demonstrate no acute finding. Mild scattered mucosal thickening throughout the ethmoidal air cells and maxillary sinuses. Paranasal sinuses are otherwise clear. No mastoid effusion. Middle ear cavities clear. Other: None. ASPECTS ([place] Stroke Program Early CT Score) - Ganglionic level infarction (caudate, lentiform nuclei, internal capsule, insula, M1-M3 cortex): 7 - Supraganglionic infarction (M4-M6 cortex): 3 Total score (0-10 with 10 being normal): 10 CTA NECK FINDINGS Aortic arch: Visualized aortic arch of normal  caliber with normal branch pattern. No flow-limiting stenosis about the origin of the great vessels. Visualized subclavian arteries widely patent. Right carotid system: Right common and internal carotid arteries are widely patent without stenosis, dissection, or occlusion. No atheromatous narrowing about the right carotid bifurcation. Left carotid system: Left common and internal carotid arteries are widely patent without stenosis, dissection or occlusion. No atheromatous narrowing about the left carotid bifurcation. Vertebral arteries: Both vertebral arteries arise from the subclavian arteries. Vertebral arteries widely patent within the neck without stenosis, dissection or occlusion. Skeleton: No acute osseous abnormality. No discrete lytic or blastic osseous lesions. Other neck: No other acute soft tissue abnormality within the neck. Upper chest: Visualized upper chest demonstrates no acute finding. Review of the MIP images confirms the above findings CTA HEAD FINDINGS Anterior circulation: Internal carotid arteries widely patent to the termini without stenosis. A1 segments widely patent. Normal anterior communicating artery complex. Anterior cerebral arteries deviated to the right but widely patent to their distal aspects without  stenosis. No M1 stenosis or occlusion. Normal MCA bifurcations. Distal MCA branches well perfused and symmetric. Posterior circulation: Both vertebral arteries widely patent to the vertebrobasilar junction. Left vertebral artery dominant. Posteroinferior cerebral arteries patent bilaterally. Basilar artery widely patent to its distal aspect without stenosis. Superior cerebral arteries patent bilaterally. Right PCA supplied mainly via the basilar. Hypoplastic left P1 with prominent left posterior communicating artery. PCAs well perfused to their distal aspects. Venous sinuses: Major dural sinuses including the superior sagittal sinus, transverse sinuses, and sigmoid sinuses appear patent as do the visualized internal jugular veins. Right transverse sinus dominant, with hypoplastic left transverse sinus. Straight sinus, vein of Galen, and internal cerebral veins patent. No evidence for dural sinus thrombosis. CT venogram confirms these findings. Anatomic variants: None significant.  No intracranial aneurysm. Delayed phase: Not performed. Left frontal mass lesion does demonstrate some intrinsic enhancement on delayed venogram images. Additionally, note made of a prominent feeder vessel extending from the overlying meninges on arterial phase images (series 7, image 38). No definite underlying AVM or other vascular abnormality. Review of the MIP images confirms the above findings IMPRESSION: CT HEAD IMPRESSION: 1. 5.2 x 5.3 x 4.8 cm homogeneous hyperdense mass centered at the left frontal lobe, most characteristic for a meningioma. Associated regional mass effect with up to 7 mm of left-to-right shift. No hydrocephalus or ventricular trapping. Further assessment with dedicated MRI, with and without contrast, recommended for further characterization. 2. Otherwise negative head CT. 3. ASPECTS = 10 CTA HEAD AND NECK IMPRESSION: 1. Negative CTA for emergent large vessel occlusion. No hemodynamically significant stenosis or  other acute vascular abnormality identified. 2. Prominent and tortuous vessel within the central aspect of the left frontal lobe mass, most characteristic for an enlarged hypertrophied feeder vessel. No definite underlying AVM or other vascular abnormality. CT VENOGRAM IMPRESSION: Negative CT venogram. No evidence for dural sinus thrombosis or other abnormality. Results were discussed by telephone at the time of interpretation on 07/13/2018 at 10:30 pm with Dr. Samara Snide. Electronically Signed   By: Jeannine Boga M.D.   On: 07/13/2018 23:20   Ct Angio Neck W Or Wo Contrast  Result Date: 07/13/2018 CLINICAL DATA:  Initial evaluation for acute altered mental status. EXAM: CT HEAD WITHOUT CONTRAST CT ANGIOGRAPHY HEAD AND NECK CT HEAD VENOGRAM TECHNIQUE: Multidetector CT imaging of the head and neck was performed using the standard protocol during bolus administration of intravenous contrast. Multiplanar CT image reconstructions and MIPs were obtained to evaluate the vascular  anatomy. Carotid stenosis measurements (when applicable) are obtained utilizing NASCET criteria, using the distal internal carotid diameter as the denominator. CONTRAST:  70mL OMNIPAQUE IOHEXOL 350 MG/ML SOLN COMPARISON:  None available. FINDINGS: CT HEAD FINDINGS Brain: There is a large homogeneous Lea hyperdense lesion positioned at the left frontal lobe that measures 5.2 x 5.3 x 4.8 cm (transverse by AP by craniocaudad), felt to be most consistent with an underlying mass lesion, likely meningioma. Associated mild localized vasogenic edema. Regional mass effect with up to 7 mm of left-to-right shift. Left lateral ventricle is partially effaced. No hydrocephalus or ventricular trapping at this time. Basilar cisterns remain patent. No other mass lesion. No definite acute intracranial hemorrhage. No acute large vessel territory infarct. No extra-axial fluid collection. Vascular: No hyperdense vessel. Skull: Scalp soft tissues  demonstrate no acute abnormality. Subtle permeated of change noted at the left frontal calvarium at the level of the left frontal mass (series 4, image 69). Few scattered prominent dural calcifications noted. Sinuses/Orbits: Globes and orbital soft tissues demonstrate no acute finding. Mild scattered mucosal thickening throughout the ethmoidal air cells and maxillary sinuses. Paranasal sinuses are otherwise clear. No mastoid effusion. Middle ear cavities clear. Other: None. ASPECTS ([place] Stroke Program Early CT Score) - Ganglionic level infarction (caudate, lentiform nuclei, internal capsule, insula, M1-M3 cortex): 7 - Supraganglionic infarction (M4-M6 cortex): 3 Total score (0-10 with 10 being normal): 10 CTA NECK FINDINGS Aortic arch: Visualized aortic arch of normal caliber with normal branch pattern. No flow-limiting stenosis about the origin of the great vessels. Visualized subclavian arteries widely patent. Right carotid system: Right common and internal carotid arteries are widely patent without stenosis, dissection, or occlusion. No atheromatous narrowing about the right carotid bifurcation. Left carotid system: Left common and internal carotid arteries are widely patent without stenosis, dissection or occlusion. No atheromatous narrowing about the left carotid bifurcation. Vertebral arteries: Both vertebral arteries arise from the subclavian arteries. Vertebral arteries widely patent within the neck without stenosis, dissection or occlusion. Skeleton: No acute osseous abnormality. No discrete lytic or blastic osseous lesions. Other neck: No other acute soft tissue abnormality within the neck. Upper chest: Visualized upper chest demonstrates no acute finding. Review of the MIP images confirms the above findings CTA HEAD FINDINGS Anterior circulation: Internal carotid arteries widely patent to the termini without stenosis. A1 segments widely patent. Normal anterior communicating artery complex. Anterior  cerebral arteries deviated to the right but widely patent to their distal aspects without stenosis. No M1 stenosis or occlusion. Normal MCA bifurcations. Distal MCA branches well perfused and symmetric. Posterior circulation: Both vertebral arteries widely patent to the vertebrobasilar junction. Left vertebral artery dominant. Posteroinferior cerebral arteries patent bilaterally. Basilar artery widely patent to its distal aspect without stenosis. Superior cerebral arteries patent bilaterally. Right PCA supplied mainly via the basilar. Hypoplastic left P1 with prominent left posterior communicating artery. PCAs well perfused to their distal aspects. Venous sinuses: Major dural sinuses including the superior sagittal sinus, transverse sinuses, and sigmoid sinuses appear patent as do the visualized internal jugular veins. Right transverse sinus dominant, with hypoplastic left transverse sinus. Straight sinus, vein of Galen, and internal cerebral veins patent. No evidence for dural sinus thrombosis. CT venogram confirms these findings. Anatomic variants: None significant.  No intracranial aneurysm. Delayed phase: Not performed. Left frontal mass lesion does demonstrate some intrinsic enhancement on delayed venogram images. Additionally, note made of a prominent feeder vessel extending from the overlying meninges on arterial phase images (series 7, image 38). No definite underlying  AVM or other vascular abnormality. Review of the MIP images confirms the above findings IMPRESSION: CT HEAD IMPRESSION: 1. 5.2 x 5.3 x 4.8 cm homogeneous hyperdense mass centered at the left frontal lobe, most characteristic for a meningioma. Associated regional mass effect with up to 7 mm of left-to-right shift. No hydrocephalus or ventricular trapping. Further assessment with dedicated MRI, with and without contrast, recommended for further characterization. 2. Otherwise negative head CT. 3. ASPECTS = 10 CTA HEAD AND NECK IMPRESSION: 1.  Negative CTA for emergent large vessel occlusion. No hemodynamically significant stenosis or other acute vascular abnormality identified. 2. Prominent and tortuous vessel within the central aspect of the left frontal lobe mass, most characteristic for an enlarged hypertrophied feeder vessel. No definite underlying AVM or other vascular abnormality. CT VENOGRAM IMPRESSION: Negative CT venogram. No evidence for dural sinus thrombosis or other abnormality. Results were discussed by telephone at the time of interpretation on 07/13/2018 at 10:30 pm with Dr. Samara Snide. Electronically Signed   By: Jeannine Boga M.D.   On: 07/13/2018 23:20   Ct Venogram Head  Result Date: 07/13/2018 CLINICAL DATA:  Initial evaluation for acute altered mental status. EXAM: CT HEAD WITHOUT CONTRAST CT ANGIOGRAPHY HEAD AND NECK CT HEAD VENOGRAM TECHNIQUE: Multidetector CT imaging of the head and neck was performed using the standard protocol during bolus administration of intravenous contrast. Multiplanar CT image reconstructions and MIPs were obtained to evaluate the vascular anatomy. Carotid stenosis measurements (when applicable) are obtained utilizing NASCET criteria, using the distal internal carotid diameter as the denominator. CONTRAST:  70mL OMNIPAQUE IOHEXOL 350 MG/ML SOLN COMPARISON:  None available. FINDINGS: CT HEAD FINDINGS Brain: There is a large homogeneous Lea hyperdense lesion positioned at the left frontal lobe that measures 5.2 x 5.3 x 4.8 cm (transverse by AP by craniocaudad), felt to be most consistent with an underlying mass lesion, likely meningioma. Associated mild localized vasogenic edema. Regional mass effect with up to 7 mm of left-to-right shift. Left lateral ventricle is partially effaced. No hydrocephalus or ventricular trapping at this time. Basilar cisterns remain patent. No other mass lesion. No definite acute intracranial hemorrhage. No acute large vessel territory infarct. No extra-axial fluid  collection. Vascular: No hyperdense vessel. Skull: Scalp soft tissues demonstrate no acute abnormality. Subtle permeated of change noted at the left frontal calvarium at the level of the left frontal mass (series 4, image 69). Few scattered prominent dural calcifications noted. Sinuses/Orbits: Globes and orbital soft tissues demonstrate no acute finding. Mild scattered mucosal thickening throughout the ethmoidal air cells and maxillary sinuses. Paranasal sinuses are otherwise clear. No mastoid effusion. Middle ear cavities clear. Other: None. ASPECTS ([place] Stroke Program Early CT Score) - Ganglionic level infarction (caudate, lentiform nuclei, internal capsule, insula, M1-M3 cortex): 7 - Supraganglionic infarction (M4-M6 cortex): 3 Total score (0-10 with 10 being normal): 10 CTA NECK FINDINGS Aortic arch: Visualized aortic arch of normal caliber with normal branch pattern. No flow-limiting stenosis about the origin of the great vessels. Visualized subclavian arteries widely patent. Right carotid system: Right common and internal carotid arteries are widely patent without stenosis, dissection, or occlusion. No atheromatous narrowing about the right carotid bifurcation. Left carotid system: Left common and internal carotid arteries are widely patent without stenosis, dissection or occlusion. No atheromatous narrowing about the left carotid bifurcation. Vertebral arteries: Both vertebral arteries arise from the subclavian arteries. Vertebral arteries widely patent within the neck without stenosis, dissection or occlusion. Skeleton: No acute osseous abnormality. No discrete lytic or blastic osseous lesions.  Other neck: No other acute soft tissue abnormality within the neck. Upper chest: Visualized upper chest demonstrates no acute finding. Review of the MIP images confirms the above findings CTA HEAD FINDINGS Anterior circulation: Internal carotid arteries widely patent to the termini without stenosis. A1 segments  widely patent. Normal anterior communicating artery complex. Anterior cerebral arteries deviated to the right but widely patent to their distal aspects without stenosis. No M1 stenosis or occlusion. Normal MCA bifurcations. Distal MCA branches well perfused and symmetric. Posterior circulation: Both vertebral arteries widely patent to the vertebrobasilar junction. Left vertebral artery dominant. Posteroinferior cerebral arteries patent bilaterally. Basilar artery widely patent to its distal aspect without stenosis. Superior cerebral arteries patent bilaterally. Right PCA supplied mainly via the basilar. Hypoplastic left P1 with prominent left posterior communicating artery. PCAs well perfused to their distal aspects. Venous sinuses: Major dural sinuses including the superior sagittal sinus, transverse sinuses, and sigmoid sinuses appear patent as do the visualized internal jugular veins. Right transverse sinus dominant, with hypoplastic left transverse sinus. Straight sinus, vein of Galen, and internal cerebral veins patent. No evidence for dural sinus thrombosis. CT venogram confirms these findings. Anatomic variants: None significant.  No intracranial aneurysm. Delayed phase: Not performed. Left frontal mass lesion does demonstrate some intrinsic enhancement on delayed venogram images. Additionally, note made of a prominent feeder vessel extending from the overlying meninges on arterial phase images (series 7, image 38). No definite underlying AVM or other vascular abnormality. Review of the MIP images confirms the above findings IMPRESSION: CT HEAD IMPRESSION: 1. 5.2 x 5.3 x 4.8 cm homogeneous hyperdense mass centered at the left frontal lobe, most characteristic for a meningioma. Associated regional mass effect with up to 7 mm of left-to-right shift. No hydrocephalus or ventricular trapping. Further assessment with dedicated MRI, with and without contrast, recommended for further characterization. 2. Otherwise  negative head CT. 3. ASPECTS = 10 CTA HEAD AND NECK IMPRESSION: 1. Negative CTA for emergent large vessel occlusion. No hemodynamically significant stenosis or other acute vascular abnormality identified. 2. Prominent and tortuous vessel within the central aspect of the left frontal lobe mass, most characteristic for an enlarged hypertrophied feeder vessel. No definite underlying AVM or other vascular abnormality. CT VENOGRAM IMPRESSION: Negative CT venogram. No evidence for dural sinus thrombosis or other abnormality. Results were discussed by telephone at the time of interpretation on 07/13/2018 at 10:30 pm with Dr. Samara Snide. Electronically Signed   By: Jeannine Boga M.D.   On: 07/13/2018 23:20   Ct Head Code Stroke Wo Contrast  Result Date: 07/13/2018 CLINICAL DATA:  Initial evaluation for acute altered mental status. EXAM: CT HEAD WITHOUT CONTRAST CT ANGIOGRAPHY HEAD AND NECK CT HEAD VENOGRAM TECHNIQUE: Multidetector CT imaging of the head and neck was performed using the standard protocol during bolus administration of intravenous contrast. Multiplanar CT image reconstructions and MIPs were obtained to evaluate the vascular anatomy. Carotid stenosis measurements (when applicable) are obtained utilizing NASCET criteria, using the distal internal carotid diameter as the denominator. CONTRAST:  32mL OMNIPAQUE IOHEXOL 350 MG/ML SOLN COMPARISON:  None available. FINDINGS: CT HEAD FINDINGS Brain: There is a large homogeneous Lea hyperdense lesion positioned at the left frontal lobe that measures 5.2 x 5.3 x 4.8 cm (transverse by AP by craniocaudad), felt to be most consistent with an underlying mass lesion, likely meningioma. Associated mild localized vasogenic edema. Regional mass effect with up to 7 mm of left-to-right shift. Left lateral ventricle is partially effaced. No hydrocephalus or ventricular trapping at this  time. Basilar cisterns remain patent. No other mass lesion. No definite acute  intracranial hemorrhage. No acute large vessel territory infarct. No extra-axial fluid collection. Vascular: No hyperdense vessel. Skull: Scalp soft tissues demonstrate no acute abnormality. Subtle permeated of change noted at the left frontal calvarium at the level of the left frontal mass (series 4, image 69). Few scattered prominent dural calcifications noted. Sinuses/Orbits: Globes and orbital soft tissues demonstrate no acute finding. Mild scattered mucosal thickening throughout the ethmoidal air cells and maxillary sinuses. Paranasal sinuses are otherwise clear. No mastoid effusion. Middle ear cavities clear. Other: None. ASPECTS ([place] Stroke Program Early CT Score) - Ganglionic level infarction (caudate, lentiform nuclei, internal capsule, insula, M1-M3 cortex): 7 - Supraganglionic infarction (M4-M6 cortex): 3 Total score (0-10 with 10 being normal): 10 CTA NECK FINDINGS Aortic arch: Visualized aortic arch of normal caliber with normal branch pattern. No flow-limiting stenosis about the origin of the great vessels. Visualized subclavian arteries widely patent. Right carotid system: Right common and internal carotid arteries are widely patent without stenosis, dissection, or occlusion. No atheromatous narrowing about the right carotid bifurcation. Left carotid system: Left common and internal carotid arteries are widely patent without stenosis, dissection or occlusion. No atheromatous narrowing about the left carotid bifurcation. Vertebral arteries: Both vertebral arteries arise from the subclavian arteries. Vertebral arteries widely patent within the neck without stenosis, dissection or occlusion. Skeleton: No acute osseous abnormality. No discrete lytic or blastic osseous lesions. Other neck: No other acute soft tissue abnormality within the neck. Upper chest: Visualized upper chest demonstrates no acute finding. Review of the MIP images confirms the above findings CTA HEAD FINDINGS Anterior circulation:  Internal carotid arteries widely patent to the termini without stenosis. A1 segments widely patent. Normal anterior communicating artery complex. Anterior cerebral arteries deviated to the right but widely patent to their distal aspects without stenosis. No M1 stenosis or occlusion. Normal MCA bifurcations. Distal MCA branches well perfused and symmetric. Posterior circulation: Both vertebral arteries widely patent to the vertebrobasilar junction. Left vertebral artery dominant. Posteroinferior cerebral arteries patent bilaterally. Basilar artery widely patent to its distal aspect without stenosis. Superior cerebral arteries patent bilaterally. Right PCA supplied mainly via the basilar. Hypoplastic left P1 with prominent left posterior communicating artery. PCAs well perfused to their distal aspects. Venous sinuses: Major dural sinuses including the superior sagittal sinus, transverse sinuses, and sigmoid sinuses appear patent as do the visualized internal jugular veins. Right transverse sinus dominant, with hypoplastic left transverse sinus. Straight sinus, vein of Galen, and internal cerebral veins patent. No evidence for dural sinus thrombosis. CT venogram confirms these findings. Anatomic variants: None significant.  No intracranial aneurysm. Delayed phase: Not performed. Left frontal mass lesion does demonstrate some intrinsic enhancement on delayed venogram images. Additionally, note made of a prominent feeder vessel extending from the overlying meninges on arterial phase images (series 7, image 38). No definite underlying AVM or other vascular abnormality. Review of the MIP images confirms the above findings IMPRESSION: CT HEAD IMPRESSION: 1. 5.2 x 5.3 x 4.8 cm homogeneous hyperdense mass centered at the left frontal lobe, most characteristic for a meningioma. Associated regional mass effect with up to 7 mm of left-to-right shift. No hydrocephalus or ventricular trapping. Further assessment with dedicated  MRI, with and without contrast, recommended for further characterization. 2. Otherwise negative head CT. 3. ASPECTS = 10 CTA HEAD AND NECK IMPRESSION: 1. Negative CTA for emergent large vessel occlusion. No hemodynamically significant stenosis or other acute vascular abnormality identified. 2. Prominent and  tortuous vessel within the central aspect of the left frontal lobe mass, most characteristic for an enlarged hypertrophied feeder vessel. No definite underlying AVM or other vascular abnormality. CT VENOGRAM IMPRESSION: Negative CT venogram. No evidence for dural sinus thrombosis or other abnormality. Results were discussed by telephone at the time of interpretation on 07/13/2018 at 10:30 pm with Dr. Samara Snide. Electronically Signed   By: Jeannine Boga M.D.   On: 07/13/2018 23:20    Procedures .Critical Care Performed by: Janeece Fitting, PA-C Authorized by: Janeece Fitting, PA-C   Critical care provider statement:    Critical care time (minutes):  45   Critical care start time:  07/13/2018 11:00 PM   Critical care end time:  07/13/2018 11:45 PM   Critical care was necessary to treat or prevent imminent or life-threatening deterioration of the following conditions:  CNS failure or compromise   Critical care was time spent personally by me on the following activities:  Discussions with consultants, evaluation of patient's response to treatment, examination of patient, ordering and performing treatments and interventions, ordering and review of laboratory studies, ordering and review of radiographic studies, pulse oximetry, re-evaluation of patient's condition, obtaining history from patient or surrogate and review of old charts   (including critical care time)  Medications Ordered in ED Medications  sodium chloride flush (NS) 0.9 % injection 3 mL (has no administration in time range)  iohexol (OMNIPAQUE) 350 MG/ML injection 75 mL (75 mLs Intravenous Contrast Given 07/13/18 2211)  LORazepam  (ATIVAN) injection 1 mg (1 mg Intravenous Given 07/13/18 2330)     Initial Impression / Assessment and Plan / ED Course  I have reviewed the triage vital signs and the nursing notes.  Pertinent labs & imaging results that were available during my care of the patient were reviewed by me and considered in my medical decision making (see chart for details).   Patient with no PMH presents to the ED brought in by husband, last seen normal around 8am by her husband.  Reports calling patient around 3 PM, though she was likely distracted as they were speaking on the phone.  Mother-in-law arrived at the home around 4 PM, noted patient to feel a little bit fatigued, reports she was tired.  Patient apparently took a nap, woke up was holding her 10-day old with a grim on her face, they called their primary care physician who reported patient likely is dehydrated.  Noted to have a droopy face on arrival into the ED although this was not witnessed by me.  On exam patient appears alert, she is able to respond some questions with one-word answers, does not form a sentence when asked to do so.  Does know where she is but does not know the exact date.  Does have some finger-to-nose abnormality.  Appears otherwise in no distress.  Dr. Lorraine Lax saw patient at the bridge along with place order for further stroke alert. CMP showed no electrolyte derangement, creatinine level is within normal limits.  That Chem-8 was unremarkable.  Beta hCG is still slightly elevated, she is postpartum 10 days ago via vaginal delivery.  CBG was 185.  CBC that shows slight leukocytosis.  Patient's vital signs are stable, she is afebrile  CT Head showed: . 5.2 x 5.3 x 4.8 cm homogeneous hyperdense mass centered at the  left frontal lobe, most characteristic for a meningioma. Associated  regional mass effect with up to 7 mm of left-to-right shift. No  hydrocephalus or ventricular trapping. Further assessment  with  dedicated MRI, with and  without contrast, recommended for further  characterization.  2. Otherwise negative head CT.  3. ASPECTS = 10   Patient will need MRI, this is pending.  Patient was signed out to incoming team Georga Kaufmann, PA pending MRI of the brain.    Portions of this note were generated with [employer]. Dictation errors may occur despite best attempts at proofreading.    Final Clinical Impressions(s) / ED Diagnoses   Final diagnoses:  None    ED Discharge Orders    None       Janeece Fitting, PA-C 07/14/18 0009    Janeece Fitting, PA-C 07/14/18 0010

## 2018-07-13 NOTE — ED Triage Notes (Signed)
Pt arrives confused, unable to get words out, unknown LNW, husband dropped off pt. Right sided facial droop noted. Pt alert.

## 2018-07-14 ENCOUNTER — Emergency Department (HOSPITAL_COMMUNITY): Payer: BC Managed Care – PPO

## 2018-07-14 ENCOUNTER — Encounter (HOSPITAL_COMMUNITY): Payer: Self-pay | Admitting: Neurological Surgery

## 2018-07-14 ENCOUNTER — Other Ambulatory Visit: Payer: Self-pay | Admitting: Neurological Surgery

## 2018-07-14 ENCOUNTER — Inpatient Hospital Stay (HOSPITAL_COMMUNITY): Payer: BC Managed Care – PPO

## 2018-07-14 DIAGNOSIS — Z833 Family history of diabetes mellitus: Secondary | ICD-10-CM | POA: Diagnosis not present

## 2018-07-14 DIAGNOSIS — R471 Dysarthria and anarthria: Secondary | ICD-10-CM | POA: Diagnosis present

## 2018-07-14 DIAGNOSIS — D320 Benign neoplasm of cerebral meninges: Secondary | ICD-10-CM | POA: Diagnosis present

## 2018-07-14 DIAGNOSIS — G8191 Hemiplegia, unspecified affecting right dominant side: Secondary | ICD-10-CM | POA: Diagnosis present

## 2018-07-14 DIAGNOSIS — Z1159 Encounter for screening for other viral diseases: Secondary | ICD-10-CM | POA: Diagnosis not present

## 2018-07-14 DIAGNOSIS — G9389 Other specified disorders of brain: Secondary | ICD-10-CM

## 2018-07-14 DIAGNOSIS — D329 Benign neoplasm of meninges, unspecified: Secondary | ICD-10-CM | POA: Diagnosis not present

## 2018-07-14 DIAGNOSIS — E860 Dehydration: Secondary | ICD-10-CM | POA: Diagnosis present

## 2018-07-14 DIAGNOSIS — O9089 Other complications of the puerperium, not elsewhere classified: Secondary | ICD-10-CM | POA: Diagnosis present

## 2018-07-14 DIAGNOSIS — R4701 Aphasia: Secondary | ICD-10-CM | POA: Diagnosis present

## 2018-07-14 DIAGNOSIS — R569 Unspecified convulsions: Secondary | ICD-10-CM | POA: Diagnosis present

## 2018-07-14 LAB — SARS CORONAVIRUS 2 BY RT PCR (HOSPITAL ORDER, PERFORMED IN ~~LOC~~ HOSPITAL LAB): SARS Coronavirus 2: NEGATIVE

## 2018-07-14 MED ORDER — ACETAMINOPHEN 325 MG PO TABS
650.0000 mg | ORAL_TABLET | Freq: Four times a day (QID) | ORAL | Status: DC | PRN
  Administered 2018-07-15 – 2018-07-18 (×4): 650 mg via ORAL
  Filled 2018-07-14 (×4): qty 2

## 2018-07-14 MED ORDER — DEXAMETHASONE SODIUM PHOSPHATE 4 MG/ML IJ SOLN
4.0000 mg | Freq: Four times a day (QID) | INTRAMUSCULAR | Status: AC
  Administered 2018-07-14 – 2018-07-15 (×4): 4 mg via INTRAVENOUS
  Filled 2018-07-14 (×4): qty 1

## 2018-07-14 MED ORDER — GADOBUTROL 1 MMOL/ML IV SOLN
9.0000 mL | Freq: Once | INTRAVENOUS | Status: AC | PRN
  Administered 2018-07-14: 9 mL via INTRAVENOUS

## 2018-07-14 MED ORDER — LEVETIRACETAM IN NACL 500 MG/100ML IV SOLN
500.0000 mg | Freq: Once | INTRAVENOUS | Status: AC
  Administered 2018-07-14: 500 mg via INTRAVENOUS
  Filled 2018-07-14: qty 100

## 2018-07-14 MED ORDER — DEXAMETHASONE SODIUM PHOSPHATE 10 MG/ML IJ SOLN
10.0000 mg | Freq: Once | INTRAMUSCULAR | Status: AC
  Administered 2018-07-14: 10 mg via INTRAVENOUS
  Filled 2018-07-14: qty 1

## 2018-07-14 MED ORDER — POTASSIUM CHLORIDE IN NACL 20-0.9 MEQ/L-% IV SOLN
INTRAVENOUS | Status: DC
  Administered 2018-07-14 – 2018-07-18 (×7): via INTRAVENOUS
  Filled 2018-07-14 (×10): qty 1000

## 2018-07-14 MED ORDER — ONDANSETRON HCL 4 MG PO TABS: 4.0000 mg | ORAL_TABLET | Freq: Four times a day (QID) | ORAL | Status: DC | PRN

## 2018-07-14 MED ORDER — ACETAMINOPHEN 650 MG RE SUPP: 650.0000 mg | Freq: Four times a day (QID) | RECTAL | Status: DC | PRN

## 2018-07-14 MED ORDER — LEVETIRACETAM IN NACL 500 MG/100ML IV SOLN
500.0000 mg | Freq: Two times a day (BID) | INTRAVENOUS | Status: DC
  Administered 2018-07-14 – 2018-07-18 (×7): 500 mg via INTRAVENOUS
  Filled 2018-07-14 (×8): qty 100

## 2018-07-14 MED ORDER — ONDANSETRON HCL 4 MG/2ML IJ SOLN: 4.0000 mg | Freq: Four times a day (QID) | INTRAMUSCULAR | Status: DC | PRN

## 2018-07-14 MED ORDER — SODIUM CHLORIDE 0.9% FLUSH
3.0000 mL | Freq: Two times a day (BID) | INTRAVENOUS | Status: DC
  Administered 2018-07-14 – 2018-07-18 (×7): 3 mL via INTRAVENOUS

## 2018-07-14 NOTE — Procedures (Signed)
History: 38 year old female with brain tumor and aphasia  Sedation: None  Technique: This is a 21 channel routine scalp EEG performed at the bedside with bipolar and monopolar montages arranged in accordance to the international 10/20 system of electrode placement. One channel was dedicated to EKG recording.    Background: There is a posterior dominant rhythm of 11 Hz which attenuates with eye opening.  In addition there is focal irregular delta activity with a wide field on the left, maximal in the left frontotemporal regions.  There are rare sharp wave discharges maximal at F7 >T7 >Fp1, F3  Photic stimulation: Physiologic driving is not performed  EEG Abnormalities: 1) rare left frontotemporal sharp waves 2) evidence of left frontotemporal cerebral dysfunction  Clinical Interpretation: This EEG is consistent with the known large left meningioma with evidence that it is causing some irritability and could serve as a seizure focus.   There was no seizure recorded on this study.   Roland Rack, MD [facility] [phone]  If 7pm- 7am, please page neurology on call as listed in [place].

## 2018-07-14 NOTE — ED Provider Notes (Signed)
April Peterson is a 38 y.o. female, patient with no pertinent past medical history, presenting to the ED with difficulty speaking and understanding.  This seems to have began sometime in the evening of July 8. Husband is at the bedside and provides some details.  Patient underwent spontaneous vaginal delivery of a son 10 days ago without complication.  She is currently breast-feeding.  HPI from Janeece Fitting, PA-C: "38 y.o female with a no PMH presents to the ED as stroke alert. History obtained from husband reports he last saw the patient this morning at 8 AM prior to going on a trip to [place].  He reports he spoke to patient around 3 PM this afternoon, noted patient to be slightly off thought she was likely distracted.  According to his mother-in-law patient was at home with a noted grandson, did look tired.  He consulted with his primary care physician who reported patient might be dehydrated, he continued to give her fluids while at home.  They noted patient was not improving.  He reports patient is currently postpartum, she had a vaginal delivery about 10 days ago.  Able to obtain further history from patient.  The history is provided by the patient, medical records and the spouse."  Past Medical History:  Diagnosis Date  . Medical history non-contributory      Physical Exam  BP 125/76   Pulse 93   Temp 98.3 F (36.8 C) (Oral)   Resp 17   SpO2 97%   Physical Exam Vitals signs and nursing note reviewed.  Constitutional:      General: She is not in acute distress.    Appearance: She is well-developed. She is not diaphoretic.  HENT:     Head: Normocephalic and atraumatic.  Eyes:     Conjunctiva/sclera: Conjunctivae normal.  Neck:     Musculoskeletal: Neck supple.  Cardiovascular:     Rate and Rhythm: Normal rate and regular rhythm.  Pulmonary:     Effort: Pulmonary effort is normal.  Skin:    General: Skin is warm and dry.     Coloration: Skin is not pale.  Neurological:      Mental Status: She is alert.     Comments: Patient seems to have difficulty speaking.  She answers "yes" or "no," but is unable to further express herself. I suspect her having both expressive and receptive aphasia.  She appears to have some weakness in the facial muscles, though she does not seem to be able to follow commands well enough for me to determine which side it affects fully. She is certainly weaker in the right upper extremity, having both grip strength deficit and arm drift. She has no noted difficulty with motor function or strength in the left upper extremity or the lower extremities. However, during the interview she does intermittently have difficulty following commands.  At times, I will ask her to do something, such as touch her right index finger to her nose, and she will simply stare at me.  Even when I touched the right index finger to show her which finger she should use, she does not complete this task.  I also demonstrate the task by touching my nose with my index finger and she just continues to stare at me.  Some of these features are consistent with neglect of the right side. She will, however, attempt to wiggle her fingers on the right hand. She does not appear to have any swallowing deficits and is able to manage  her oral secretions without noted difficulty.    Psychiatric:        Behavior: Behavior is slowed.     ED Course/Procedures    Procedures  Abnormal Labs Reviewed  APTT - Abnormal; Notable for the following components:      Result Value   aPTT 37 (*)    All other components within normal limits  CBC - Abnormal; Notable for the following components:   WBC 12.4 (*)    All other components within normal limits  DIFFERENTIAL - Abnormal; Notable for the following components:   Neutro Abs 8.0 (*)    All other components within normal limits  COMPREHENSIVE METABOLIC PANEL - Abnormal; Notable for the following components:   Glucose, Bld 100 (*)     Albumin 3.4 (*)    All other components within normal limits  CBG MONITORING, ED - Abnormal; Notable for the following components:   Glucose-Capillary 185 (*)    All other components within normal limits  I-STAT BETA HCG BLOOD, ED ([facility], [facility], [facility] ONLY) - Abnormal; Notable for the following components:   I-stat hCG, quantitative 5.3 (*)    All other components within normal limits    Ct Angio Head W Or Wo Contrast  Result Date: 07/13/2018 CLINICAL DATA:  Initial evaluation for acute altered mental status. EXAM: CT HEAD WITHOUT CONTRAST CT ANGIOGRAPHY HEAD AND NECK CT HEAD VENOGRAM TECHNIQUE: Multidetector CT imaging of the head and neck was performed using the standard protocol during bolus administration of intravenous contrast. Multiplanar CT image reconstructions and MIPs were obtained to evaluate the vascular anatomy. Carotid stenosis measurements (when applicable) are obtained utilizing NASCET criteria, using the distal internal carotid diameter as the denominator. CONTRAST:  57mL OMNIPAQUE IOHEXOL 350 MG/ML SOLN COMPARISON:  None available. FINDINGS: CT HEAD FINDINGS Brain: There is a large homogeneous Lea hyperdense lesion positioned at the left frontal lobe that measures 5.2 x 5.3 x 4.8 cm (transverse by AP by craniocaudad), felt to be most consistent with an underlying mass lesion, likely meningioma. Associated mild localized vasogenic edema. Regional mass effect with up to 7 mm of left-to-right shift. Left lateral ventricle is partially effaced. No hydrocephalus or ventricular trapping at this time. Basilar cisterns remain patent. No other mass lesion. No definite acute intracranial hemorrhage. No acute large vessel territory infarct. No extra-axial fluid collection. Vascular: No hyperdense vessel. Skull: Scalp soft tissues demonstrate no acute abnormality. Subtle permeated of change noted at the left frontal calvarium at the level of the left frontal mass (series 4, image 69). Few scattered  prominent dural calcifications noted. Sinuses/Orbits: Globes and orbital soft tissues demonstrate no acute finding. Mild scattered mucosal thickening throughout the ethmoidal air cells and maxillary sinuses. Paranasal sinuses are otherwise clear. No mastoid effusion. Middle ear cavities clear. Other: None. ASPECTS ([place] Stroke Program Early CT Score) - Ganglionic level infarction (caudate, lentiform nuclei, internal capsule, insula, M1-M3 cortex): 7 - Supraganglionic infarction (M4-M6 cortex): 3 Total score (0-10 with 10 being normal): 10 CTA NECK FINDINGS Aortic arch: Visualized aortic arch of normal caliber with normal branch pattern. No flow-limiting stenosis about the origin of the great vessels. Visualized subclavian arteries widely patent. Right carotid system: Right common and internal carotid arteries are widely patent without stenosis, dissection, or occlusion. No atheromatous narrowing about the right carotid bifurcation. Left carotid system: Left common and internal carotid arteries are widely patent without stenosis, dissection or occlusion. No atheromatous narrowing about the left carotid bifurcation. Vertebral arteries: Both vertebral arteries arise from  the subclavian arteries. Vertebral arteries widely patent within the neck without stenosis, dissection or occlusion. Skeleton: No acute osseous abnormality. No discrete lytic or blastic osseous lesions. Other neck: No other acute soft tissue abnormality within the neck. Upper chest: Visualized upper chest demonstrates no acute finding. Review of the MIP images confirms the above findings CTA HEAD FINDINGS Anterior circulation: Internal carotid arteries widely patent to the termini without stenosis. A1 segments widely patent. Normal anterior communicating artery complex. Anterior cerebral arteries deviated to the right but widely patent to their distal aspects without stenosis. No M1 stenosis or occlusion. Normal MCA bifurcations. Distal MCA branches  well perfused and symmetric. Posterior circulation: Both vertebral arteries widely patent to the vertebrobasilar junction. Left vertebral artery dominant. Posteroinferior cerebral arteries patent bilaterally. Basilar artery widely patent to its distal aspect without stenosis. Superior cerebral arteries patent bilaterally. Right PCA supplied mainly via the basilar. Hypoplastic left P1 with prominent left posterior communicating artery. PCAs well perfused to their distal aspects. Venous sinuses: Major dural sinuses including the superior sagittal sinus, transverse sinuses, and sigmoid sinuses appear patent as do the visualized internal jugular veins. Right transverse sinus dominant, with hypoplastic left transverse sinus. Straight sinus, vein of Galen, and internal cerebral veins patent. No evidence for dural sinus thrombosis. CT venogram confirms these findings. Anatomic variants: None significant.  No intracranial aneurysm. Delayed phase: Not performed. Left frontal mass lesion does demonstrate some intrinsic enhancement on delayed venogram images. Additionally, note made of a prominent feeder vessel extending from the overlying meninges on arterial phase images (series 7, image 38). No definite underlying AVM or other vascular abnormality. Review of the MIP images confirms the above findings IMPRESSION: CT HEAD IMPRESSION: 1. 5.2 x 5.3 x 4.8 cm homogeneous hyperdense mass centered at the left frontal lobe, most characteristic for a meningioma. Associated regional mass effect with up to 7 mm of left-to-right shift. No hydrocephalus or ventricular trapping. Further assessment with dedicated MRI, with and without contrast, recommended for further characterization. 2. Otherwise negative head CT. 3. ASPECTS = 10 CTA HEAD AND NECK IMPRESSION: 1. Negative CTA for emergent large vessel occlusion. No hemodynamically significant stenosis or other acute vascular abnormality identified. 2. Prominent and tortuous vessel within  the central aspect of the left frontal lobe mass, most characteristic for an enlarged hypertrophied feeder vessel. No definite underlying AVM or other vascular abnormality. CT VENOGRAM IMPRESSION: Negative CT venogram. No evidence for dural sinus thrombosis or other abnormality. Results were discussed by telephone at the time of interpretation on 07/13/2018 at 10:30 pm with Dr. Samara Snide. Electronically Signed   By: Jeannine Boga M.D.   On: 07/13/2018 23:20   Ct Angio Neck W Or Wo Contrast  Result Date: 07/13/2018 CLINICAL DATA:  Initial evaluation for acute altered mental status. EXAM: CT HEAD WITHOUT CONTRAST CT ANGIOGRAPHY HEAD AND NECK CT HEAD VENOGRAM TECHNIQUE: Multidetector CT imaging of the head and neck was performed using the standard protocol during bolus administration of intravenous contrast. Multiplanar CT image reconstructions and MIPs were obtained to evaluate the vascular anatomy. Carotid stenosis measurements (when applicable) are obtained utilizing NASCET criteria, using the distal internal carotid diameter as the denominator. CONTRAST:  21mL OMNIPAQUE IOHEXOL 350 MG/ML SOLN COMPARISON:  None available. FINDINGS: CT HEAD FINDINGS Brain: There is a large homogeneous Lea hyperdense lesion positioned at the left frontal lobe that measures 5.2 x 5.3 x 4.8 cm (transverse by AP by craniocaudad), felt to be most consistent with an underlying mass lesion, likely meningioma. Associated  mild localized vasogenic edema. Regional mass effect with up to 7 mm of left-to-right shift. Left lateral ventricle is partially effaced. No hydrocephalus or ventricular trapping at this time. Basilar cisterns remain patent. No other mass lesion. No definite acute intracranial hemorrhage. No acute large vessel territory infarct. No extra-axial fluid collection. Vascular: No hyperdense vessel. Skull: Scalp soft tissues demonstrate no acute abnormality. Subtle permeated of change noted at the left frontal  calvarium at the level of the left frontal mass (series 4, image 69). Few scattered prominent dural calcifications noted. Sinuses/Orbits: Globes and orbital soft tissues demonstrate no acute finding. Mild scattered mucosal thickening throughout the ethmoidal air cells and maxillary sinuses. Paranasal sinuses are otherwise clear. No mastoid effusion. Middle ear cavities clear. Other: None. ASPECTS ([place] Stroke Program Early CT Score) - Ganglionic level infarction (caudate, lentiform nuclei, internal capsule, insula, M1-M3 cortex): 7 - Supraganglionic infarction (M4-M6 cortex): 3 Total score (0-10 with 10 being normal): 10 CTA NECK FINDINGS Aortic arch: Visualized aortic arch of normal caliber with normal branch pattern. No flow-limiting stenosis about the origin of the great vessels. Visualized subclavian arteries widely patent. Right carotid system: Right common and internal carotid arteries are widely patent without stenosis, dissection, or occlusion. No atheromatous narrowing about the right carotid bifurcation. Left carotid system: Left common and internal carotid arteries are widely patent without stenosis, dissection or occlusion. No atheromatous narrowing about the left carotid bifurcation. Vertebral arteries: Both vertebral arteries arise from the subclavian arteries. Vertebral arteries widely patent within the neck without stenosis, dissection or occlusion. Skeleton: No acute osseous abnormality. No discrete lytic or blastic osseous lesions. Other neck: No other acute soft tissue abnormality within the neck. Upper chest: Visualized upper chest demonstrates no acute finding. Review of the MIP images confirms the above findings CTA HEAD FINDINGS Anterior circulation: Internal carotid arteries widely patent to the termini without stenosis. A1 segments widely patent. Normal anterior communicating artery complex. Anterior cerebral arteries deviated to the right but widely patent to their distal aspects without  stenosis. No M1 stenosis or occlusion. Normal MCA bifurcations. Distal MCA branches well perfused and symmetric. Posterior circulation: Both vertebral arteries widely patent to the vertebrobasilar junction. Left vertebral artery dominant. Posteroinferior cerebral arteries patent bilaterally. Basilar artery widely patent to its distal aspect without stenosis. Superior cerebral arteries patent bilaterally. Right PCA supplied mainly via the basilar. Hypoplastic left P1 with prominent left posterior communicating artery. PCAs well perfused to their distal aspects. Venous sinuses: Major dural sinuses including the superior sagittal sinus, transverse sinuses, and sigmoid sinuses appear patent as do the visualized internal jugular veins. Right transverse sinus dominant, with hypoplastic left transverse sinus. Straight sinus, vein of Galen, and internal cerebral veins patent. No evidence for dural sinus thrombosis. CT venogram confirms these findings. Anatomic variants: None significant.  No intracranial aneurysm. Delayed phase: Not performed. Left frontal mass lesion does demonstrate some intrinsic enhancement on delayed venogram images. Additionally, note made of a prominent feeder vessel extending from the overlying meninges on arterial phase images (series 7, image 38). No definite underlying AVM or other vascular abnormality. Review of the MIP images confirms the above findings IMPRESSION: CT HEAD IMPRESSION: 1. 5.2 x 5.3 x 4.8 cm homogeneous hyperdense mass centered at the left frontal lobe, most characteristic for a meningioma. Associated regional mass effect with up to 7 mm of left-to-right shift. No hydrocephalus or ventricular trapping. Further assessment with dedicated MRI, with and without contrast, recommended for further characterization. 2. Otherwise negative head CT. 3. ASPECTS =  10 CTA HEAD AND NECK IMPRESSION: 1. Negative CTA for emergent large vessel occlusion. No hemodynamically significant stenosis or  other acute vascular abnormality identified. 2. Prominent and tortuous vessel within the central aspect of the left frontal lobe mass, most characteristic for an enlarged hypertrophied feeder vessel. No definite underlying AVM or other vascular abnormality. CT VENOGRAM IMPRESSION: Negative CT venogram. No evidence for dural sinus thrombosis or other abnormality. Results were discussed by telephone at the time of interpretation on 07/13/2018 at 10:30 pm with Dr. Samara Snide. Electronically Signed   By: Jeannine Boga M.D.   On: 07/13/2018 23:20   Mr Jeri Cos EV Contrast  Result Date: 07/14/2018 CLINICAL DATA:  Follow-up examination for acute stroke, mass on prior CT. EXAM: MRI HEAD WITHOUT AND WITH CONTRAST TECHNIQUE: Multiplanar, multiecho pulse sequences of the brain and surrounding structures were obtained without and with intravenous contrast. CONTRAST:  9 cc of Gadavist. COMPARISON:  Prior CT and CTA from 07/13/2018. FINDINGS: Brain: Again seen is a large well-circumscribed mass centered at the left frontal convexity. Lesion measures 5.3 x 5.6 x 5.2 cm in maximal dimensions (AP by transverse by craniocaudad). Thin rim/cleft of CSF seen surrounding the lesion on T2 weighted sequence, consistent with an extra-axial mass. Lesion demonstrates hypointense precontrast T1 signal intensity with homogeneous solid postcontrast enhancement. Finding most consistent with a meningioma. Prominent flow void seen coursing from the overlying meninges to the central size deep aspect of the lesion, consistent with a large arterial feeder vessel (series 10, image 22). Overlying dural thickening and enhancement. Scattered areas of internal susceptibility artifact likely reflect blood products. Small amount of surrounding vasogenic edema within the adjacent frontal lobe. Regional mass effect with associated 7 mm of left-to-right shift. Left lateral ventricle partially compressed. No hydrocephalus or ventricular trapping.  Basilar cisterns remain patent. Remainder the brain is normal in appearance. No evidence for acute or subacute infarct. Gray-white matter differentiation otherwise maintained. No areas of remote cortical infarction. No other evidence for acute or chronic intracranial hemorrhage. No other mass lesion or abnormal enhancement. Pituitary gland suprasellar region within normal limits. Midline structures intact. Vascular: Major intracranial vascular flow voids are maintained. Skull and upper cervical spine: Craniocervical junction within normal limits. Upper cervical spine normal. Bone marrow signal intensity within normal limits. No scalp soft tissue abnormality. Few scattered dural calcifications again noted. Sinuses/Orbits: Globes and orbital soft tissues within normal limits. Paranasal sinuses are clear. No mastoid effusion. Inner ear structures grossly normal. Other: None. IMPRESSION: 1. 5.3 x 5.6 x 5.2 cm extra-axial mass overlying the left frontal convexity, most consistent with meningioma. Associated regional mass effect with up to 7 mm of left-to-right shift. 2. Otherwise normal brain MRI. Electronically Signed   By: Jeannine Boga M.D.   On: 07/14/2018 01:27   Ct Venogram Head  Result Date: 07/13/2018 CLINICAL DATA:  Initial evaluation for acute altered mental status. EXAM: CT HEAD WITHOUT CONTRAST CT ANGIOGRAPHY HEAD AND NECK CT HEAD VENOGRAM TECHNIQUE: Multidetector CT imaging of the head and neck was performed using the standard protocol during bolus administration of intravenous contrast. Multiplanar CT image reconstructions and MIPs were obtained to evaluate the vascular anatomy. Carotid stenosis measurements (when applicable) are obtained utilizing NASCET criteria, using the distal internal carotid diameter as the denominator. CONTRAST:  27mL OMNIPAQUE IOHEXOL 350 MG/ML SOLN COMPARISON:  None available. FINDINGS: CT HEAD FINDINGS Brain: There is a large homogeneous Lea hyperdense lesion positioned  at the left frontal lobe that measures 5.2 x 5.3 x 4.8  cm (transverse by AP by craniocaudad), felt to be most consistent with an underlying mass lesion, likely meningioma. Associated mild localized vasogenic edema. Regional mass effect with up to 7 mm of left-to-right shift. Left lateral ventricle is partially effaced. No hydrocephalus or ventricular trapping at this time. Basilar cisterns remain patent. No other mass lesion. No definite acute intracranial hemorrhage. No acute large vessel territory infarct. No extra-axial fluid collection. Vascular: No hyperdense vessel. Skull: Scalp soft tissues demonstrate no acute abnormality. Subtle permeated of change noted at the left frontal calvarium at the level of the left frontal mass (series 4, image 69). Few scattered prominent dural calcifications noted. Sinuses/Orbits: Globes and orbital soft tissues demonstrate no acute finding. Mild scattered mucosal thickening throughout the ethmoidal air cells and maxillary sinuses. Paranasal sinuses are otherwise clear. No mastoid effusion. Middle ear cavities clear. Other: None. ASPECTS ([place] Stroke Program Early CT Score) - Ganglionic level infarction (caudate, lentiform nuclei, internal capsule, insula, M1-M3 cortex): 7 - Supraganglionic infarction (M4-M6 cortex): 3 Total score (0-10 with 10 being normal): 10 CTA NECK FINDINGS Aortic arch: Visualized aortic arch of normal caliber with normal branch pattern. No flow-limiting stenosis about the origin of the great vessels. Visualized subclavian arteries widely patent. Right carotid system: Right common and internal carotid arteries are widely patent without stenosis, dissection, or occlusion. No atheromatous narrowing about the right carotid bifurcation. Left carotid system: Left common and internal carotid arteries are widely patent without stenosis, dissection or occlusion. No atheromatous narrowing about the left carotid bifurcation. Vertebral arteries: Both vertebral  arteries arise from the subclavian arteries. Vertebral arteries widely patent within the neck without stenosis, dissection or occlusion. Skeleton: No acute osseous abnormality. No discrete lytic or blastic osseous lesions. Other neck: No other acute soft tissue abnormality within the neck. Upper chest: Visualized upper chest demonstrates no acute finding. Review of the MIP images confirms the above findings CTA HEAD FINDINGS Anterior circulation: Internal carotid arteries widely patent to the termini without stenosis. A1 segments widely patent. Normal anterior communicating artery complex. Anterior cerebral arteries deviated to the right but widely patent to their distal aspects without stenosis. No M1 stenosis or occlusion. Normal MCA bifurcations. Distal MCA branches well perfused and symmetric. Posterior circulation: Both vertebral arteries widely patent to the vertebrobasilar junction. Left vertebral artery dominant. Posteroinferior cerebral arteries patent bilaterally. Basilar artery widely patent to its distal aspect without stenosis. Superior cerebral arteries patent bilaterally. Right PCA supplied mainly via the basilar. Hypoplastic left P1 with prominent left posterior communicating artery. PCAs well perfused to their distal aspects. Venous sinuses: Major dural sinuses including the superior sagittal sinus, transverse sinuses, and sigmoid sinuses appear patent as do the visualized internal jugular veins. Right transverse sinus dominant, with hypoplastic left transverse sinus. Straight sinus, vein of Galen, and internal cerebral veins patent. No evidence for dural sinus thrombosis. CT venogram confirms these findings. Anatomic variants: None significant.  No intracranial aneurysm. Delayed phase: Not performed. Left frontal mass lesion does demonstrate some intrinsic enhancement on delayed venogram images. Additionally, note made of a prominent feeder vessel extending from the overlying meninges on arterial  phase images (series 7, image 38). No definite underlying AVM or other vascular abnormality. Review of the MIP images confirms the above findings IMPRESSION: CT HEAD IMPRESSION: 1. 5.2 x 5.3 x 4.8 cm homogeneous hyperdense mass centered at the left frontal lobe, most characteristic for a meningioma. Associated regional mass effect with up to 7 mm of left-to-right shift. No hydrocephalus or ventricular trapping. Further assessment  with dedicated MRI, with and without contrast, recommended for further characterization. 2. Otherwise negative head CT. 3. ASPECTS = 10 CTA HEAD AND NECK IMPRESSION: 1. Negative CTA for emergent large vessel occlusion. No hemodynamically significant stenosis or other acute vascular abnormality identified. 2. Prominent and tortuous vessel within the central aspect of the left frontal lobe mass, most characteristic for an enlarged hypertrophied feeder vessel. No definite underlying AVM or other vascular abnormality. CT VENOGRAM IMPRESSION: Negative CT venogram. No evidence for dural sinus thrombosis or other abnormality. Results were discussed by telephone at the time of interpretation on 07/13/2018 at 10:30 pm with Dr. Samara Snide. Electronically Signed   By: Jeannine Boga M.D.   On: 07/13/2018 23:20   Ct Head Code Stroke Wo Contrast  Result Date: 07/13/2018 CLINICAL DATA:  Initial evaluation for acute altered mental status. EXAM: CT HEAD WITHOUT CONTRAST CT ANGIOGRAPHY HEAD AND NECK CT HEAD VENOGRAM TECHNIQUE: Multidetector CT imaging of the head and neck was performed using the standard protocol during bolus administration of intravenous contrast. Multiplanar CT image reconstructions and MIPs were obtained to evaluate the vascular anatomy. Carotid stenosis measurements (when applicable) are obtained utilizing NASCET criteria, using the distal internal carotid diameter as the denominator. CONTRAST:  56mL OMNIPAQUE IOHEXOL 350 MG/ML SOLN COMPARISON:  None available. FINDINGS: CT  HEAD FINDINGS Brain: There is a large homogeneous Lea hyperdense lesion positioned at the left frontal lobe that measures 5.2 x 5.3 x 4.8 cm (transverse by AP by craniocaudad), felt to be most consistent with an underlying mass lesion, likely meningioma. Associated mild localized vasogenic edema. Regional mass effect with up to 7 mm of left-to-right shift. Left lateral ventricle is partially effaced. No hydrocephalus or ventricular trapping at this time. Basilar cisterns remain patent. No other mass lesion. No definite acute intracranial hemorrhage. No acute large vessel territory infarct. No extra-axial fluid collection. Vascular: No hyperdense vessel. Skull: Scalp soft tissues demonstrate no acute abnormality. Subtle permeated of change noted at the left frontal calvarium at the level of the left frontal mass (series 4, image 69). Few scattered prominent dural calcifications noted. Sinuses/Orbits: Globes and orbital soft tissues demonstrate no acute finding. Mild scattered mucosal thickening throughout the ethmoidal air cells and maxillary sinuses. Paranasal sinuses are otherwise clear. No mastoid effusion. Middle ear cavities clear. Other: None. ASPECTS ([place] Stroke Program Early CT Score) - Ganglionic level infarction (caudate, lentiform nuclei, internal capsule, insula, M1-M3 cortex): 7 - Supraganglionic infarction (M4-M6 cortex): 3 Total score (0-10 with 10 being normal): 10 CTA NECK FINDINGS Aortic arch: Visualized aortic arch of normal caliber with normal branch pattern. No flow-limiting stenosis about the origin of the great vessels. Visualized subclavian arteries widely patent. Right carotid system: Right common and internal carotid arteries are widely patent without stenosis, dissection, or occlusion. No atheromatous narrowing about the right carotid bifurcation. Left carotid system: Left common and internal carotid arteries are widely patent without stenosis, dissection or occlusion. No atheromatous  narrowing about the left carotid bifurcation. Vertebral arteries: Both vertebral arteries arise from the subclavian arteries. Vertebral arteries widely patent within the neck without stenosis, dissection or occlusion. Skeleton: No acute osseous abnormality. No discrete lytic or blastic osseous lesions. Other neck: No other acute soft tissue abnormality within the neck. Upper chest: Visualized upper chest demonstrates no acute finding. Review of the MIP images confirms the above findings CTA HEAD FINDINGS Anterior circulation: Internal carotid arteries widely patent to the termini without stenosis. A1 segments widely patent. Normal anterior communicating artery complex. Anterior cerebral  arteries deviated to the right but widely patent to their distal aspects without stenosis. No M1 stenosis or occlusion. Normal MCA bifurcations. Distal MCA branches well perfused and symmetric. Posterior circulation: Both vertebral arteries widely patent to the vertebrobasilar junction. Left vertebral artery dominant. Posteroinferior cerebral arteries patent bilaterally. Basilar artery widely patent to its distal aspect without stenosis. Superior cerebral arteries patent bilaterally. Right PCA supplied mainly via the basilar. Hypoplastic left P1 with prominent left posterior communicating artery. PCAs well perfused to their distal aspects. Venous sinuses: Major dural sinuses including the superior sagittal sinus, transverse sinuses, and sigmoid sinuses appear patent as do the visualized internal jugular veins. Right transverse sinus dominant, with hypoplastic left transverse sinus. Straight sinus, vein of [place], and internal cerebral veins patent. No evidence for dural sinus thrombosis. CT venogram confirms these findings. Anatomic variants: None significant.  No intracranial aneurysm. Delayed phase: Not performed. Left frontal mass lesion does demonstrate some intrinsic enhancement on delayed venogram images. Additionally, note made  of a prominent feeder vessel extending from the overlying meninges on arterial phase images (series 7, image 38). No definite underlying AVM or other vascular abnormality. Review of the MIP images confirms the above findings IMPRESSION: CT HEAD IMPRESSION: 1. 5.2 x 5.3 x 4.8 cm homogeneous hyperdense mass centered at the left frontal lobe, most characteristic for a meningioma. Associated regional mass effect with up to 7 mm of left-to-right shift. No hydrocephalus or ventricular trapping. Further assessment with dedicated MRI, with and without contrast, recommended for further characterization. 2. Otherwise negative head CT. 3. ASPECTS = 10 CTA HEAD AND NECK IMPRESSION: 1. Negative CTA for emergent large vessel occlusion. No hemodynamically significant stenosis or other acute vascular abnormality identified. 2. Prominent and tortuous vessel within the central aspect of the left frontal lobe mass, most characteristic for an enlarged hypertrophied feeder vessel. No definite underlying AVM or other vascular abnormality. CT VENOGRAM IMPRESSION: Negative CT venogram. No evidence for dural sinus thrombosis or other abnormality. Results were discussed by telephone at the time of interpretation on 07/13/2018 at 10:30 pm with Dr. Samara Snide. Electronically Signed   By: Jeannine Boga M.D.   On: 07/13/2018 23:20      MDM   Clinical Course as of Jul 14 231  Thu Jul 14, 2018  0110 Repeated patient's physical exam.   [SJ]  0118 Spoke with Dr. Ronnald Ramp, neurosurgeon. He will come see the patient. Recommends 10 mg Decadron IV.   [SJ]  0155 Dr. Ronnald Ramp suspects patient may be having a prolonged seizure causing her continued deficits. Requests 500 mg IV Keppra.   [SJ]    Clinical Course User Index [SJ] Lorayne Bender, PA-C   Patient care handoff report received from [facility], [place]. Plan: Patient awaiting MRI to further characterize brain mass noted on CT.  Patient presents with expressive and receptive  aphasia as well as right upper extremity weakness.  Code stroke was activated.   Brain mass noted on CT, consistent with meningioma, confirmed on MRI. Neurosurgery consulted and patient admitted.      Vitals:   07/13/18 2136 07/13/18 2300 07/13/18 2315 07/13/18 2330  BP: (!) 170/97 124/85 (!) 135/93 125/76  Pulse: 100 79 93   Resp: 18 19 13 17   Temp: 98.3 F (36.8 C)     TempSrc: Oral     SpO2: 100% 97% 97%       Lorayne Bender, PA-C 07/14/18 0235    Ward, Delice Bison, DO 07/14/18 3419

## 2018-07-14 NOTE — Consult Note (Addendum)
Requesting Physician: Dr. Chaya Jan PA-C    Chief Complaint: Confusion, abnormal speech  History obtained from: Patient and Chart     HPI:                                                                                                                                       April Peterson is a 38 y.o. female with no significant past medical history, 10 days postpartum after uneventful childbirth brought to the ED by her husband for confusion, abnormal speech.  Last known normal was today morning at 8:00 with patient's husband went to work.  Around 4:00 p.m. patient's mother went to check up on her patient appeared confused.  When the husband arrived from work around 6:00, she was laying in bed.  While trying to talk to her, she was having significant difficulty getting words out.  He drove over to the ER and she was stroke alert by ED triage.  Blood pressure was 532 systolic on arrival.  Stat CT head showed a large hyperdense extra-axial mass in the left frontal lobe causing regional mass-effect and 7 mm midline shift.  CT angiogram negative for large vessel occlusion.  Prominent tortuous vessels in the left frontal lobe mass.  No AVM or other abnormal blood vessels.  MRI brain was performed with and without contrast, final read pending but appears to be suggestive of meningioma as well.  No seizure activity was witnessed. Has been complaining of occasional headaches.   Past Medical History:  Diagnosis Date  . Medical history non-contributory     Past Surgical History:  Procedure Laterality Date  . WISDOM TOOTH EXTRACTION      Family History  Problem Relation Age of Onset  . Diabetes Father    Social History:  reports that she has never smoked. She has never used smokeless tobacco. She reports that she does not drink alcohol or use drugs.  Allergies: No Known Allergies  Medications:                                                                                                                         I reviewed home medications   ROS:  14 systems reviewed and negative except above    Examination:                                                                                                      General: Appears well-developed and well-nourished.  Psych: Affect appropriate to situation Eyes: No scleral injection HENT: No OP obstrucion Head: Normocephalic.  Cardiovascular: Normal rate and regular rhythm.  Respiratory: Effort normal and breath sounds normal to anterior ascultation GI: Soft.  No distension. There is no tenderness.  Skin: WDI    Neurological Examination Mental Status: Alert, oriented to her name but cannot answer other questions.  Moderate to severe expressive aphasia, comprehension appears to be more intact but still has some difficulty following commands. Cranial Nerves: II: Visual fields grossly normal, no obvious neglect noted III,IV, VI: ptosis not present, extra-ocular motions intact bilaterally, pupils equal, round, reactive to light and accommodation V,VII: smile symmetric, facial light touch sensation normal bilaterally VIII: hearing normal bilaterally IX,X: uvula rises symmetrically XI: bilateral shoulder shrug XII: midline tongue extension Motor: Right : Upper extremity   4+/5    Left:     Upper extremity   5/5  Lower extremity   4+/5     Lower extremity   5/5 Tone and bulk:normal tone throughout; no atrophy noted Sensory: Limited assessment due to patient's aphasia however does not appear to neglect Deep Tendon Reflexes: 2+ and symmetric throughout Plantars: Right: downgoing   Left: downgoing Cerebellar: No gross ataxia noted     Lab Results: Basic Metabolic Panel: Recent Labs  Lab 07/13/18 04-20-44 07/13/18 2154  NA 140 141  K 4.2 4.0  CL 109 109  CO2 22  --   GLUCOSE 100* 99  BUN 17 17   CREATININE 0.73 0.70  CALCIUM 9.4  --     CBC: Recent Labs  Lab 07/13/18 04-20-2144 07/13/18 2154  WBC 12.4*  --   NEUTROABS 8.0*  --   HGB 14.0 14.6  HCT 42.9 43.0  MCV 92.3  --   PLT 286  --     Coagulation Studies: Recent Labs    07/13/18 Apr 20, 2144  LABPROT 14.0  INR 1.1    Imaging: Ct Angio Head W Or Wo Contrast  Result Date: 07/13/2018 CLINICAL DATA:  Initial evaluation for acute altered mental status. EXAM: CT HEAD WITHOUT CONTRAST CT ANGIOGRAPHY HEAD AND NECK CT HEAD VENOGRAM TECHNIQUE: Multidetector CT imaging of the head and neck was performed using the standard protocol during bolus administration of intravenous contrast. Multiplanar CT image reconstructions and MIPs were obtained to evaluate the vascular anatomy. Carotid stenosis measurements (when applicable) are obtained utilizing NASCET criteria, using the distal internal carotid diameter as the denominator. CONTRAST:  27mL OMNIPAQUE IOHEXOL 350 MG/ML SOLN COMPARISON:  None available. FINDINGS: CT HEAD FINDINGS Brain: There is a large homogeneous Lea hyperdense lesion positioned at the left frontal lobe that measures 5.2 x 5.3 x 4.8 cm (transverse by AP by craniocaudad), felt to be most consistent with an underlying mass lesion, likely meningioma. Associated mild localized vasogenic edema. Regional mass effect with  up to 7 mm of left-to-right shift. Left lateral ventricle is partially effaced. No hydrocephalus or ventricular trapping at this time. Basilar cisterns remain patent. No other mass lesion. No definite acute intracranial hemorrhage. No acute large vessel territory infarct. No extra-axial fluid collection. Vascular: No hyperdense vessel. Skull: Scalp soft tissues demonstrate no acute abnormality. Subtle permeated of change noted at the left frontal calvarium at the level of the left frontal mass (series 4, image 69). Few scattered prominent dural calcifications noted. Sinuses/Orbits: Globes and orbital soft tissues  demonstrate no acute finding. Mild scattered mucosal thickening throughout the ethmoidal air cells and maxillary sinuses. Paranasal sinuses are otherwise clear. No mastoid effusion. Middle ear cavities clear. Other: None. ASPECTS ([place] Stroke Program Early CT Score) - Ganglionic level infarction (caudate, lentiform nuclei, internal capsule, insula, M1-M3 cortex): 7 - Supraganglionic infarction (M4-M6 cortex): 3 Total score (0-10 with 10 being normal): 10 CTA NECK FINDINGS Aortic arch: Visualized aortic arch of normal caliber with normal branch pattern. No flow-limiting stenosis about the origin of the great vessels. Visualized subclavian arteries widely patent. Right carotid system: Right common and internal carotid arteries are widely patent without stenosis, dissection, or occlusion. No atheromatous narrowing about the right carotid bifurcation. Left carotid system: Left common and internal carotid arteries are widely patent without stenosis, dissection or occlusion. No atheromatous narrowing about the left carotid bifurcation. Vertebral arteries: Both vertebral arteries arise from the subclavian arteries. Vertebral arteries widely patent within the neck without stenosis, dissection or occlusion. Skeleton: No acute osseous abnormality. No discrete lytic or blastic osseous lesions. Other neck: No other acute soft tissue abnormality within the neck. Upper chest: Visualized upper chest demonstrates no acute finding. Review of the MIP images confirms the above findings CTA HEAD FINDINGS Anterior circulation: Internal carotid arteries widely patent to the termini without stenosis. A1 segments widely patent. Normal anterior communicating artery complex. Anterior cerebral arteries deviated to the right but widely patent to their distal aspects without stenosis. No M1 stenosis or occlusion. Normal MCA bifurcations. Distal MCA branches well perfused and symmetric. Posterior circulation: Both vertebral arteries widely  patent to the vertebrobasilar junction. Left vertebral artery dominant. Posteroinferior cerebral arteries patent bilaterally. Basilar artery widely patent to its distal aspect without stenosis. Superior cerebral arteries patent bilaterally. Right PCA supplied mainly via the basilar. Hypoplastic left P1 with prominent left posterior communicating artery. PCAs well perfused to their distal aspects. Venous sinuses: Major dural sinuses including the superior sagittal sinus, transverse sinuses, and sigmoid sinuses appear patent as do the visualized internal jugular veins. Right transverse sinus dominant, with hypoplastic left transverse sinus. Straight sinus, vein of Galen, and internal cerebral veins patent. No evidence for dural sinus thrombosis. CT venogram confirms these findings. Anatomic variants: None significant.  No intracranial aneurysm. Delayed phase: Not performed. Left frontal mass lesion does demonstrate some intrinsic enhancement on delayed venogram images. Additionally, note made of a prominent feeder vessel extending from the overlying meninges on arterial phase images (series 7, image 38). No definite underlying AVM or other vascular abnormality. Review of the MIP images confirms the above findings IMPRESSION: CT HEAD IMPRESSION: 1. 5.2 x 5.3 x 4.8 cm homogeneous hyperdense mass centered at the left frontal lobe, most characteristic for a meningioma. Associated regional mass effect with up to 7 mm of left-to-right shift. No hydrocephalus or ventricular trapping. Further assessment with dedicated MRI, with and without contrast, recommended for further characterization. 2. Otherwise negative head CT. 3. ASPECTS = 10 CTA HEAD AND NECK IMPRESSION: 1. Negative  CTA for emergent large vessel occlusion. No hemodynamically significant stenosis or other acute vascular abnormality identified. 2. Prominent and tortuous vessel within the central aspect of the left frontal lobe mass, most characteristic for an  enlarged hypertrophied feeder vessel. No definite underlying AVM or other vascular abnormality. CT VENOGRAM IMPRESSION: Negative CT venogram. No evidence for dural sinus thrombosis or other abnormality. Results were discussed by telephone at the time of interpretation on 07/13/2018 at 10:30 pm with Dr. Samara Snide. Electronically Signed   By: Jeannine Boga M.D.   On: 07/13/2018 23:20   Ct Angio Neck W Or Wo Contrast  Result Date: 07/13/2018 CLINICAL DATA:  Initial evaluation for acute altered mental status. EXAM: CT HEAD WITHOUT CONTRAST CT ANGIOGRAPHY HEAD AND NECK CT HEAD VENOGRAM TECHNIQUE: Multidetector CT imaging of the head and neck was performed using the standard protocol during bolus administration of intravenous contrast. Multiplanar CT image reconstructions and MIPs were obtained to evaluate the vascular anatomy. Carotid stenosis measurements (when applicable) are obtained utilizing NASCET criteria, using the distal internal carotid diameter as the denominator. CONTRAST:  9mL OMNIPAQUE IOHEXOL 350 MG/ML SOLN COMPARISON:  None available. FINDINGS: CT HEAD FINDINGS Brain: There is a large homogeneous Lea hyperdense lesion positioned at the left frontal lobe that measures 5.2 x 5.3 x 4.8 cm (transverse by AP by craniocaudad), felt to be most consistent with an underlying mass lesion, likely meningioma. Associated mild localized vasogenic edema. Regional mass effect with up to 7 mm of left-to-right shift. Left lateral ventricle is partially effaced. No hydrocephalus or ventricular trapping at this time. Basilar cisterns remain patent. No other mass lesion. No definite acute intracranial hemorrhage. No acute large vessel territory infarct. No extra-axial fluid collection. Vascular: No hyperdense vessel. Skull: Scalp soft tissues demonstrate no acute abnormality. Subtle permeated of change noted at the left frontal calvarium at the level of the left frontal mass (series 4, image 69). Few scattered  prominent dural calcifications noted. Sinuses/Orbits: Globes and orbital soft tissues demonstrate no acute finding. Mild scattered mucosal thickening throughout the ethmoidal air cells and maxillary sinuses. Paranasal sinuses are otherwise clear. No mastoid effusion. Middle ear cavities clear. Other: None. ASPECTS ([place] Stroke Program Early CT Score) - Ganglionic level infarction (caudate, lentiform nuclei, internal capsule, insula, M1-M3 cortex): 7 - Supraganglionic infarction (M4-M6 cortex): 3 Total score (0-10 with 10 being normal): 10 CTA NECK FINDINGS Aortic arch: Visualized aortic arch of normal caliber with normal branch pattern. No flow-limiting stenosis about the origin of the great vessels. Visualized subclavian arteries widely patent. Right carotid system: Right common and internal carotid arteries are widely patent without stenosis, dissection, or occlusion. No atheromatous narrowing about the right carotid bifurcation. Left carotid system: Left common and internal carotid arteries are widely patent without stenosis, dissection or occlusion. No atheromatous narrowing about the left carotid bifurcation. Vertebral arteries: Both vertebral arteries arise from the subclavian arteries. Vertebral arteries widely patent within the neck without stenosis, dissection or occlusion. Skeleton: No acute osseous abnormality. No discrete lytic or blastic osseous lesions. Other neck: No other acute soft tissue abnormality within the neck. Upper chest: Visualized upper chest demonstrates no acute finding. Review of the MIP images confirms the above findings CTA HEAD FINDINGS Anterior circulation: Internal carotid arteries widely patent to the termini without stenosis. A1 segments widely patent. Normal anterior communicating artery complex. Anterior cerebral arteries deviated to the right but widely patent to their distal aspects without stenosis. No M1 stenosis or occlusion. Normal MCA bifurcations. Distal MCA branches  well perfused and symmetric. Posterior circulation: Both vertebral arteries widely patent to the vertebrobasilar junction. Left vertebral artery dominant. Posteroinferior cerebral arteries patent bilaterally. Basilar artery widely patent to its distal aspect without stenosis. Superior cerebral arteries patent bilaterally. Right PCA supplied mainly via the basilar. Hypoplastic left P1 with prominent left posterior communicating artery. PCAs well perfused to their distal aspects. Venous sinuses: Major dural sinuses including the superior sagittal sinus, transverse sinuses, and sigmoid sinuses appear patent as do the visualized internal jugular veins. Right transverse sinus dominant, with hypoplastic left transverse sinus. Straight sinus, vein of Galen, and internal cerebral veins patent. No evidence for dural sinus thrombosis. CT venogram confirms these findings. Anatomic variants: None significant.  No intracranial aneurysm. Delayed phase: Not performed. Left frontal mass lesion does demonstrate some intrinsic enhancement on delayed venogram images. Additionally, note made of a prominent feeder vessel extending from the overlying meninges on arterial phase images (series 7, image 38). No definite underlying AVM or other vascular abnormality. Review of the MIP images confirms the above findings IMPRESSION: CT HEAD IMPRESSION: 1. 5.2 x 5.3 x 4.8 cm homogeneous hyperdense mass centered at the left frontal lobe, most characteristic for a meningioma. Associated regional mass effect with up to 7 mm of left-to-right shift. No hydrocephalus or ventricular trapping. Further assessment with dedicated MRI, with and without contrast, recommended for further characterization. 2. Otherwise negative head CT. 3. ASPECTS = 10 CTA HEAD AND NECK IMPRESSION: 1. Negative CTA for emergent large vessel occlusion. No hemodynamically significant stenosis or other acute vascular abnormality identified. 2. Prominent and tortuous vessel within  the central aspect of the left frontal lobe mass, most characteristic for an enlarged hypertrophied feeder vessel. No definite underlying AVM or other vascular abnormality. CT VENOGRAM IMPRESSION: Negative CT venogram. No evidence for dural sinus thrombosis or other abnormality. Results were discussed by telephone at the time of interpretation on 07/13/2018 at 10:30 pm with Dr. Samara Snide. Electronically Signed   By: Jeannine Boga M.D.   On: 07/13/2018 23:20   Ct Venogram Head  Result Date: 07/13/2018 CLINICAL DATA:  Initial evaluation for acute altered mental status. EXAM: CT HEAD WITHOUT CONTRAST CT ANGIOGRAPHY HEAD AND NECK CT HEAD VENOGRAM TECHNIQUE: Multidetector CT imaging of the head and neck was performed using the standard protocol during bolus administration of intravenous contrast. Multiplanar CT image reconstructions and MIPs were obtained to evaluate the vascular anatomy. Carotid stenosis measurements (when applicable) are obtained utilizing NASCET criteria, using the distal internal carotid diameter as the denominator. CONTRAST:  51mL OMNIPAQUE IOHEXOL 350 MG/ML SOLN COMPARISON:  None available. FINDINGS: CT HEAD FINDINGS Brain: There is a large homogeneous Lea hyperdense lesion positioned at the left frontal lobe that measures 5.2 x 5.3 x 4.8 cm (transverse by AP by craniocaudad), felt to be most consistent with an underlying mass lesion, likely meningioma. Associated mild localized vasogenic edema. Regional mass effect with up to 7 mm of left-to-right shift. Left lateral ventricle is partially effaced. No hydrocephalus or ventricular trapping at this time. Basilar cisterns remain patent. No other mass lesion. No definite acute intracranial hemorrhage. No acute large vessel territory infarct. No extra-axial fluid collection. Vascular: No hyperdense vessel. Skull: Scalp soft tissues demonstrate no acute abnormality. Subtle permeated of change noted at the left frontal calvarium at the level  of the left frontal mass (series 4, image 69). Few scattered prominent dural calcifications noted. Sinuses/Orbits: Globes and orbital soft tissues demonstrate no acute finding. Mild scattered mucosal thickening throughout the ethmoidal air cells  and maxillary sinuses. Paranasal sinuses are otherwise clear. No mastoid effusion. Middle ear cavities clear. Other: None. ASPECTS ([place] Stroke Program Early CT Score) - Ganglionic level infarction (caudate, lentiform nuclei, internal capsule, insula, M1-M3 cortex): 7 - Supraganglionic infarction (M4-M6 cortex): 3 Total score (0-10 with 10 being normal): 10 CTA NECK FINDINGS Aortic arch: Visualized aortic arch of normal caliber with normal branch pattern. No flow-limiting stenosis about the origin of the great vessels. Visualized subclavian arteries widely patent. Right carotid system: Right common and internal carotid arteries are widely patent without stenosis, dissection, or occlusion. No atheromatous narrowing about the right carotid bifurcation. Left carotid system: Left common and internal carotid arteries are widely patent without stenosis, dissection or occlusion. No atheromatous narrowing about the left carotid bifurcation. Vertebral arteries: Both vertebral arteries arise from the subclavian arteries. Vertebral arteries widely patent within the neck without stenosis, dissection or occlusion. Skeleton: No acute osseous abnormality. No discrete lytic or blastic osseous lesions. Other neck: No other acute soft tissue abnormality within the neck. Upper chest: Visualized upper chest demonstrates no acute finding. Review of the MIP images confirms the above findings CTA HEAD FINDINGS Anterior circulation: Internal carotid arteries widely patent to the termini without stenosis. A1 segments widely patent. Normal anterior communicating artery complex. Anterior cerebral arteries deviated to the right but widely patent to their distal aspects without stenosis. No M1  stenosis or occlusion. Normal MCA bifurcations. Distal MCA branches well perfused and symmetric. Posterior circulation: Both vertebral arteries widely patent to the vertebrobasilar junction. Left vertebral artery dominant. Posteroinferior cerebral arteries patent bilaterally. Basilar artery widely patent to its distal aspect without stenosis. Superior cerebral arteries patent bilaterally. Right PCA supplied mainly via the basilar. Hypoplastic left P1 with prominent left posterior communicating artery. PCAs well perfused to their distal aspects. Venous sinuses: Major dural sinuses including the superior sagittal sinus, transverse sinuses, and sigmoid sinuses appear patent as do the visualized internal jugular veins. Right transverse sinus dominant, with hypoplastic left transverse sinus. Straight sinus, vein of Galen, and internal cerebral veins patent. No evidence for dural sinus thrombosis. CT venogram confirms these findings. Anatomic variants: None significant.  No intracranial aneurysm. Delayed phase: Not performed. Left frontal mass lesion does demonstrate some intrinsic enhancement on delayed venogram images. Additionally, note made of a prominent feeder vessel extending from the overlying meninges on arterial phase images (series 7, image 38). No definite underlying AVM or other vascular abnormality. Review of the MIP images confirms the above findings IMPRESSION: CT HEAD IMPRESSION: 1. 5.2 x 5.3 x 4.8 cm homogeneous hyperdense mass centered at the left frontal lobe, most characteristic for a meningioma. Associated regional mass effect with up to 7 mm of left-to-right shift. No hydrocephalus or ventricular trapping. Further assessment with dedicated MRI, with and without contrast, recommended for further characterization. 2. Otherwise negative head CT. 3. ASPECTS = 10 CTA HEAD AND NECK IMPRESSION: 1. Negative CTA for emergent large vessel occlusion. No hemodynamically significant stenosis or other acute  vascular abnormality identified. 2. Prominent and tortuous vessel within the central aspect of the left frontal lobe mass, most characteristic for an enlarged hypertrophied feeder vessel. No definite underlying AVM or other vascular abnormality. CT VENOGRAM IMPRESSION: Negative CT venogram. No evidence for dural sinus thrombosis or other abnormality. Results were discussed by telephone at the time of interpretation on 07/13/2018 at 10:30 pm with Dr. Samara Snide. Electronically Signed   By: Jeannine Boga M.D.   On: 07/13/2018 23:20   Ct Head Code Stroke Wo Contrast  Result Date: 07/13/2018 CLINICAL DATA:  Initial evaluation for acute altered mental status. EXAM: CT HEAD WITHOUT CONTRAST CT ANGIOGRAPHY HEAD AND NECK CT HEAD VENOGRAM TECHNIQUE: Multidetector CT imaging of the head and neck was performed using the standard protocol during bolus administration of intravenous contrast. Multiplanar CT image reconstructions and MIPs were obtained to evaluate the vascular anatomy. Carotid stenosis measurements (when applicable) are obtained utilizing NASCET criteria, using the distal internal carotid diameter as the denominator. CONTRAST:  47mL OMNIPAQUE IOHEXOL 350 MG/ML SOLN COMPARISON:  None available. FINDINGS: CT HEAD FINDINGS Brain: There is a large homogeneous Lea hyperdense lesion positioned at the left frontal lobe that measures 5.2 x 5.3 x 4.8 cm (transverse by AP by craniocaudad), felt to be most consistent with an underlying mass lesion, likely meningioma. Associated mild localized vasogenic edema. Regional mass effect with up to 7 mm of left-to-right shift. Left lateral ventricle is partially effaced. No hydrocephalus or ventricular trapping at this time. Basilar cisterns remain patent. No other mass lesion. No definite acute intracranial hemorrhage. No acute large vessel territory infarct. No extra-axial fluid collection. Vascular: No hyperdense vessel. Skull: Scalp soft tissues demonstrate no acute  abnormality. Subtle permeated of change noted at the left frontal calvarium at the level of the left frontal mass (series 4, image 69). Few scattered prominent dural calcifications noted. Sinuses/Orbits: Globes and orbital soft tissues demonstrate no acute finding. Mild scattered mucosal thickening throughout the ethmoidal air cells and maxillary sinuses. Paranasal sinuses are otherwise clear. No mastoid effusion. Middle ear cavities clear. Other: None. ASPECTS ([place] Stroke Program Early CT Score) - Ganglionic level infarction (caudate, lentiform nuclei, internal capsule, insula, M1-M3 cortex): 7 - Supraganglionic infarction (M4-M6 cortex): 3 Total score (0-10 with 10 being normal): 10 CTA NECK FINDINGS Aortic arch: Visualized aortic arch of normal caliber with normal branch pattern. No flow-limiting stenosis about the origin of the great vessels. Visualized subclavian arteries widely patent. Right carotid system: Right common and internal carotid arteries are widely patent without stenosis, dissection, or occlusion. No atheromatous narrowing about the right carotid bifurcation. Left carotid system: Left common and internal carotid arteries are widely patent without stenosis, dissection or occlusion. No atheromatous narrowing about the left carotid bifurcation. Vertebral arteries: Both vertebral arteries arise from the subclavian arteries. Vertebral arteries widely patent within the neck without stenosis, dissection or occlusion. Skeleton: No acute osseous abnormality. No discrete lytic or blastic osseous lesions. Other neck: No other acute soft tissue abnormality within the neck. Upper chest: Visualized upper chest demonstrates no acute finding. Review of the MIP images confirms the above findings CTA HEAD FINDINGS Anterior circulation: Internal carotid arteries widely patent to the termini without stenosis. A1 segments widely patent. Normal anterior communicating artery complex. Anterior cerebral arteries  deviated to the right but widely patent to their distal aspects without stenosis. No M1 stenosis or occlusion. Normal MCA bifurcations. Distal MCA branches well perfused and symmetric. Posterior circulation: Both vertebral arteries widely patent to the vertebrobasilar junction. Left vertebral artery dominant. Posteroinferior cerebral arteries patent bilaterally. Basilar artery widely patent to its distal aspect without stenosis. Superior cerebral arteries patent bilaterally. Right PCA supplied mainly via the basilar. Hypoplastic left P1 with prominent left posterior communicating artery. PCAs well perfused to their distal aspects. Venous sinuses: Major dural sinuses including the superior sagittal sinus, transverse sinuses, and sigmoid sinuses appear patent as do the visualized internal jugular veins. Right transverse sinus dominant, with hypoplastic left transverse sinus. Straight sinus, vein of Galen, and internal cerebral veins patent. No  evidence for dural sinus thrombosis. CT venogram confirms these findings. Anatomic variants: None significant.  No intracranial aneurysm. Delayed phase: Not performed. Left frontal mass lesion does demonstrate some intrinsic enhancement on delayed venogram images. Additionally, note made of a prominent feeder vessel extending from the overlying meninges on arterial phase images (series 7, image 38). No definite underlying AVM or other vascular abnormality. Review of the MIP images confirms the above findings IMPRESSION: CT HEAD IMPRESSION: 1. 5.2 x 5.3 x 4.8 cm homogeneous hyperdense mass centered at the left frontal lobe, most characteristic for a meningioma. Associated regional mass effect with up to 7 mm of left-to-right shift. No hydrocephalus or ventricular trapping. Further assessment with dedicated MRI, with and without contrast, recommended for further characterization. 2. Otherwise negative head CT. 3. ASPECTS = 10 CTA HEAD AND NECK IMPRESSION: 1. Negative CTA for  emergent large vessel occlusion. No hemodynamically significant stenosis or other acute vascular abnormality identified. 2. Prominent and tortuous vessel within the central aspect of the left frontal lobe mass, most characteristic for an enlarged hypertrophied feeder vessel. No definite underlying AVM or other vascular abnormality. CT VENOGRAM IMPRESSION: Negative CT venogram. No evidence for dural sinus thrombosis or other abnormality. Results were discussed by telephone at the time of interpretation on 07/13/2018 at 10:30 pm with Dr. Samara Snide. Electronically Signed   By: Jeannine Boga M.D.   On: 07/13/2018 23:20     I have reviewed the above imaging : CT head CTA and CT venogram MRI brain   ASSESSMENT AND PLAN  38 year old female 10 days postpartum with her second child presents with aphasia and mild right hemiparesis.  CT head shows large extra-axial mass, likely meningioma causing significant midline shift.  Recommendations MRI brain with and without contrast Neurosurgery consult  Routine EEG Continue Keppra 500mg   BID   Jelani Vreeland [facility] Pager Number [phone]

## 2018-07-14 NOTE — Plan of Care (Signed)

## 2018-07-14 NOTE — ED Notes (Signed)
ED TO INPATIENT HANDOFF REPORT  ED Nurse Name and Phone #: Annie Main [phone]  S Name/Age/Gender April Peterson 38 y.o. female Room/Bed: 031C/031C  Code Status   Code Status: Prior  Home/SNF/Other Home Patient oriented to: self, place, time and situation Is this baseline? Yes   Triage Complete: Triage complete  Chief Complaint trouble speaking headache diarrhea  Triage Note Pt arrives confused, unable to get words out, unknown LNW, husband dropped off pt. Right sided facial droop noted. Pt alert.    Allergies No Known Allergies  Level of Care/Admitting Diagnosis ED Disposition    ED Disposition Condition [facility]: [place] [[id number]]  Level of Care: Med-Surg [16]  Covid Evaluation: Asymptomatic Screening Protocol (No Symptoms)  Diagnosis: Brain mass [[id number]]  Admitting Physician: Hans Eden  Attending Physician: Eustace Moore [[id number]]  Estimated length of stay: 5 - 7 days  Certification:: I certify this patient will need inpatient services for at least 2 midnights  Bed request comments: 4np  PT Class (Do Not Modify): Inpatient [101]  PT Acc Code (Do Not Modify): Private [1]       B Medical/Surgery History Past Medical History:  Diagnosis Date  . Medical history non-contributory    Past Surgical History:  Procedure Laterality Date  . WISDOM TOOTH EXTRACTION       A IV Location/Drains/Wounds Patient Lines/Drains/Airways Status   Active Line/Drains/Airways    Name:   Placement date:   Placement time:   Site:   Days:   Peripheral IV 07/14/18 Left Antecubital   07/14/18    0245    Antecubital   less than 1          Intake/Output Last 24 hours No intake or output data in the 24 hours ending 07/14/18 0247  Labs/Imaging Results for orders placed or performed during the hospital encounter of 07/13/18 (from the past 48 hour(s))  Protime-INR     Status: None   Collection Time: 07/13/18  9:46 PM  Result  Value Ref Range   Prothrombin Time 14.0 11.4 - 15.2 seconds   INR 1.1 0.8 - 1.2    Comment: (NOTE) INR goal varies based on device and disease states. Performed at [facility], [place] [address] [place], [place] [address]   APTT     Status: Abnormal   Collection Time: 07/13/18  9:46 PM  Result Value Ref Range   aPTT 37 (H) 24 - 36 seconds    Comment:        IF BASELINE aPTT IS ELEVATED, SUGGEST PATIENT RISK ASSESSMENT BE USED TO DETERMINE APPROPRIATE ANTICOAGULANT THERAPY. Performed at [facility], [place] [address], [place], [place] [address]   CBC     Status: Abnormal   Collection Time: 07/13/18  9:46 PM  Result Value Ref Range   WBC 12.4 (H) 4.0 - 10.5 K/uL   RBC 4.65 3.87 - 5.11 MIL/uL   Hemoglobin 14.0 12.0 - 15.0 g/dL   HCT 42.9 36.0 - 46.0 %   MCV 92.3 80.0 - 100.0 fL   MCH 30.1 26.0 - 34.0 pg   MCHC 32.6 30.0 - 36.0 g/dL   RDW 12.8 11.5 - 15.5 %   Platelets 286 150 - 400 K/uL   nRBC 0.0 0.0 - 0.2 %    Comment: Performed at [facility], [place] [address] [place],  [address]  Differential     Status: Abnormal   Collection Time:  07/13/18  9:46 PM  Result Value Ref Range   Neutrophils Relative % 65 %   Neutro Abs 8.0 (H) 1.7 - 7.7 K/uL   Lymphocytes Relative 26 %   Lymphs Abs 3.2 0.7 - 4.0 K/uL   Monocytes Relative 8 %   Monocytes Absolute 1.0 0.1 - 1.0 K/uL   Eosinophils Relative 1 %   Eosinophils Absolute 0.1 0.0 - 0.5 K/uL   Basophils Relative 0 %   Basophils Absolute 0.0 0.0 - 0.1 K/uL   Immature Granulocytes 0 %   Abs Immature Granulocytes 0.04 0.00 - 0.07 K/uL    Comment: Performed at [place] [address], [place], [place] [address]  Comprehensive metabolic panel     Status: Abnormal   Collection Time: 07/13/18  9:46 PM  Result Value Ref Range   Sodium 140 135 - 145 mmol/L   Potassium 4.2 3.5 - 5.1 mmol/L   Chloride 109 98 - 111 mmol/L   CO2 22 22 - 32 mmol/L   Glucose, Bld 100 (H) 70 - 99 mg/dL   BUN 17 6 - 20  mg/dL   Creatinine, Ser 0.73 0.44 - 1.00 mg/dL   Calcium 9.4 8.9 - 10.3 mg/dL   Total Protein 6.9 6.5 - 8.1 g/dL   Albumin 3.4 (L) 3.5 - 5.0 g/dL   AST 23 15 - 41 U/L   ALT 27 0 - 44 U/L   Alkaline Phosphatase 89 38 - 126 U/L   Total Bilirubin 0.8 0.3 - 1.2 mg/dL   GFR calc non Af Amer >60 >60 mL/min   GFR calc Af Amer >60 >60 mL/min   Anion gap 9 5 - 15    Comment: Performed at [facility] [address] [place], [place] [address]  CBG monitoring, ED     Status: Abnormal   Collection Time: 07/13/18  9:46 PM  Result Value Ref Range   Glucose-Capillary 185 (H) 70 - 99 mg/dL  I-Stat beta hCG blood, ED     Status: Abnormal   Collection Time: 07/13/18  9:52 PM  Result Value Ref Range   I-stat hCG, quantitative 5.3 (H) <5 mIU/mL   Comment 3            Comment:   GEST. AGE      CONC.  (mIU/mL)   <=1 WEEK        5 - 50     2 WEEKS       50 - 500     3 WEEKS       100 - 10,000     4 WEEKS     1,000 - 30,000        FEMALE AND NON-PREGNANT FEMALE:     LESS THAN 5 mIU/mL   I-stat chem 8, ED     Status: None   Collection Time: 07/13/18  9:54 PM  Result Value Ref Range   Sodium 141 135 - 145 mmol/L   Potassium 4.0 3.5 - 5.1 mmol/L   Chloride 109 98 - 111 mmol/L   BUN 17 6 - 20 mg/dL   Creatinine, Ser 0.70 0.44 - 1.00 mg/dL   Glucose, Bld 99 70 - 99 mg/dL   Calcium, Ion 1.18 1.15 - 1.40 mmol/L   TCO2 24 22 - 32 mmol/L   Hemoglobin 14.6 12.0 - 15.0 g/dL   HCT 43.0 36.0 - 46.0 %   Ct Angio Head W Or Wo Contrast  Result Date: 07/13/2018 CLINICAL DATA:  Initial evaluation for acute altered mental status. EXAM: CT HEAD WITHOUT CONTRAST CT ANGIOGRAPHY HEAD AND NECK CT HEAD VENOGRAM TECHNIQUE: Multidetector CT imaging of the head and neck was performed using the standard protocol during bolus administration of intravenous contrast. Multiplanar CT image reconstructions and MIPs were obtained to evaluate the vascular anatomy. Carotid stenosis measurements (when applicable) are obtained  utilizing NASCET criteria, using the distal internal carotid diameter as the denominator. CONTRAST:  17mL OMNIPAQUE IOHEXOL 350 MG/ML SOLN COMPARISON:  None available. FINDINGS: CT HEAD FINDINGS Brain: There is a large homogeneous Lea hyperdense lesion positioned at the left frontal lobe that measures 5.2 x 5.3 x 4.8 cm (transverse by AP by craniocaudad), felt to be most consistent with an underlying mass lesion, likely meningioma. Associated mild localized vasogenic edema. Regional mass effect with up to 7 mm of left-to-right shift. Left lateral ventricle is partially effaced. No hydrocephalus or ventricular trapping at this time. Basilar cisterns remain patent. No other mass lesion. No definite acute intracranial hemorrhage. No acute large vessel territory infarct. No extra-axial fluid collection. Vascular: No hyperdense vessel. Skull: Scalp soft tissues demonstrate no acute abnormality. Subtle permeated of change noted at the left frontal calvarium at the level of the left frontal mass (series 4, image 69). Few scattered prominent dural calcifications noted. Sinuses/Orbits: Globes and orbital soft tissues demonstrate no acute finding. Mild scattered mucosal thickening throughout the ethmoidal air cells and maxillary sinuses. Paranasal sinuses are otherwise clear. No mastoid effusion. Middle ear cavities clear. Other: None. ASPECTS ([place] Stroke Program Early CT Score) - Ganglionic level infarction (caudate, lentiform nuclei, internal capsule, insula, M1-M3 cortex): 7 - Supraganglionic infarction (M4-M6 cortex): 3 Total score (0-10 with 10 being normal): 10 CTA NECK FINDINGS Aortic arch: Visualized aortic arch of normal caliber with normal branch pattern. No flow-limiting stenosis about the origin of the great vessels. Visualized subclavian arteries widely patent. Right carotid system: Right common and internal carotid arteries are widely patent without stenosis, dissection, or occlusion. No atheromatous  narrowing about the right carotid bifurcation. Left carotid system: Left common and internal carotid arteries are widely patent without stenosis, dissection or occlusion. No atheromatous narrowing about the left carotid bifurcation. Vertebral arteries: Both vertebral arteries arise from the subclavian arteries. Vertebral arteries widely patent within the neck without stenosis, dissection or occlusion. Skeleton: No acute osseous abnormality. No discrete lytic or blastic osseous lesions. Other neck: No other acute soft tissue abnormality within the neck. Upper chest: Visualized upper chest demonstrates no acute finding. Review of the MIP images confirms the above findings CTA HEAD FINDINGS Anterior circulation: Internal carotid arteries widely patent to the termini without stenosis. A1 segments widely patent. Normal anterior communicating artery complex. Anterior cerebral arteries deviated to the right but widely patent to their distal aspects without stenosis. No M1 stenosis or occlusion. Normal MCA bifurcations. Distal MCA branches well perfused and symmetric. Posterior circulation: Both vertebral arteries widely patent to the vertebrobasilar junction. Left vertebral artery dominant. Posteroinferior cerebral arteries patent bilaterally. Basilar artery widely patent to its distal aspect without stenosis. Superior cerebral arteries patent bilaterally. Right PCA supplied mainly via the basilar. Hypoplastic left P1 with prominent left posterior communicating artery. PCAs well perfused to their distal aspects. Venous sinuses: Major dural sinuses including the superior sagittal sinus, transverse sinuses, and sigmoid sinuses appear patent as do the visualized internal jugular veins. Right transverse sinus dominant, with hypoplastic left transverse sinus. Straight sinus, vein of Galen, and internal cerebral veins patent. No evidence for dural sinus thrombosis. CT  venogram confirms these findings. Anatomic variants: None  significant.  No intracranial aneurysm. Delayed phase: Not performed. Left frontal mass lesion does demonstrate some intrinsic enhancement on delayed venogram images. Additionally, note made of a prominent feeder vessel extending from the overlying meninges on arterial phase images (series 7, image 38). No definite underlying AVM or other vascular abnormality. Review of the MIP images confirms the above findings IMPRESSION: CT HEAD IMPRESSION: 1. 5.2 x 5.3 x 4.8 cm homogeneous hyperdense mass centered at the left frontal lobe, most characteristic for a meningioma. Associated regional mass effect with up to 7 mm of left-to-right shift. No hydrocephalus or ventricular trapping. Further assessment with dedicated MRI, with and without contrast, recommended for further characterization. 2. Otherwise negative head CT. 3. ASPECTS = 10 CTA HEAD AND NECK IMPRESSION: 1. Negative CTA for emergent large vessel occlusion. No hemodynamically significant stenosis or other acute vascular abnormality identified. 2. Prominent and tortuous vessel within the central aspect of the left frontal lobe mass, most characteristic for an enlarged hypertrophied feeder vessel. No definite underlying AVM or other vascular abnormality. CT VENOGRAM IMPRESSION: Negative CT venogram. No evidence for dural sinus thrombosis or other abnormality. Results were discussed by telephone at the time of interpretation on 07/13/2018 at 10:30 pm with Dr. Samara Snide. Electronically Signed   By: Jeannine Boga M.D.   On: 07/13/2018 23:20   Ct Angio Neck W Or Wo Contrast  Result Date: 07/13/2018 CLINICAL DATA:  Initial evaluation for acute altered mental status. EXAM: CT HEAD WITHOUT CONTRAST CT ANGIOGRAPHY HEAD AND NECK CT HEAD VENOGRAM TECHNIQUE: Multidetector CT imaging of the head and neck was performed using the standard protocol during bolus administration of intravenous contrast. Multiplanar CT image reconstructions and MIPs were obtained to  evaluate the vascular anatomy. Carotid stenosis measurements (when applicable) are obtained utilizing NASCET criteria, using the distal internal carotid diameter as the denominator. CONTRAST:  73mL OMNIPAQUE IOHEXOL 350 MG/ML SOLN COMPARISON:  None available. FINDINGS: CT HEAD FINDINGS Brain: There is a large homogeneous Lea hyperdense lesion positioned at the left frontal lobe that measures 5.2 x 5.3 x 4.8 cm (transverse by AP by craniocaudad), felt to be most consistent with an underlying mass lesion, likely meningioma. Associated mild localized vasogenic edema. Regional mass effect with up to 7 mm of left-to-right shift. Left lateral ventricle is partially effaced. No hydrocephalus or ventricular trapping at this time. Basilar cisterns remain patent. No other mass lesion. No definite acute intracranial hemorrhage. No acute large vessel territory infarct. No extra-axial fluid collection. Vascular: No hyperdense vessel. Skull: Scalp soft tissues demonstrate no acute abnormality. Subtle permeated of change noted at the left frontal calvarium at the level of the left frontal mass (series 4, image 69). Few scattered prominent dural calcifications noted. Sinuses/Orbits: Globes and orbital soft tissues demonstrate no acute finding. Mild scattered mucosal thickening throughout the ethmoidal air cells and maxillary sinuses. Paranasal sinuses are otherwise clear. No mastoid effusion. Middle ear cavities clear. Other: None. ASPECTS ([place] Stroke Program Early CT Score) - Ganglionic level infarction (caudate, lentiform nuclei, internal capsule, insula, M1-M3 cortex): 7 - Supraganglionic infarction (M4-M6 cortex): 3 Total score (0-10 with 10 being normal): 10 CTA NECK FINDINGS Aortic arch: Visualized aortic arch of normal caliber with normal branch pattern. No flow-limiting stenosis about the origin of the great vessels. Visualized subclavian arteries widely patent. Right carotid system: Right common and internal carotid  arteries are widely patent without stenosis, dissection, or occlusion. No atheromatous narrowing about the right carotid bifurcation. Left  carotid system: Left common and internal carotid arteries are widely patent without stenosis, dissection or occlusion. No atheromatous narrowing about the left carotid bifurcation. Vertebral arteries: Both vertebral arteries arise from the subclavian arteries. Vertebral arteries widely patent within the neck without stenosis, dissection or occlusion. Skeleton: No acute osseous abnormality. No discrete lytic or blastic osseous lesions. Other neck: No other acute soft tissue abnormality within the neck. Upper chest: Visualized upper chest demonstrates no acute finding. Review of the MIP images confirms the above findings CTA HEAD FINDINGS Anterior circulation: Internal carotid arteries widely patent to the termini without stenosis. A1 segments widely patent. Normal anterior communicating artery complex. Anterior cerebral arteries deviated to the right but widely patent to their distal aspects without stenosis. No M1 stenosis or occlusion. Normal MCA bifurcations. Distal MCA branches well perfused and symmetric. Posterior circulation: Both vertebral arteries widely patent to the vertebrobasilar junction. Left vertebral artery dominant. Posteroinferior cerebral arteries patent bilaterally. Basilar artery widely patent to its distal aspect without stenosis. Superior cerebral arteries patent bilaterally. Right PCA supplied mainly via the basilar. Hypoplastic left P1 with prominent left posterior communicating artery. PCAs well perfused to their distal aspects. Venous sinuses: Major dural sinuses including the superior sagittal sinus, transverse sinuses, and sigmoid sinuses appear patent as do the visualized internal jugular veins. Right transverse sinus dominant, with hypoplastic left transverse sinus. Straight sinus, vein of Galen, and internal cerebral veins patent. No evidence for  dural sinus thrombosis. CT venogram confirms these findings. Anatomic variants: None significant.  No intracranial aneurysm. Delayed phase: Not performed. Left frontal mass lesion does demonstrate some intrinsic enhancement on delayed venogram images. Additionally, note made of a prominent feeder vessel extending from the overlying meninges on arterial phase images (series 7, image 38). No definite underlying AVM or other vascular abnormality. Review of the MIP images confirms the above findings IMPRESSION: CT HEAD IMPRESSION: 1. 5.2 x 5.3 x 4.8 cm homogeneous hyperdense mass centered at the left frontal lobe, most characteristic for a meningioma. Associated regional mass effect with up to 7 mm of left-to-right shift. No hydrocephalus or ventricular trapping. Further assessment with dedicated MRI, with and without contrast, recommended for further characterization. 2. Otherwise negative head CT. 3. ASPECTS = 10 CTA HEAD AND NECK IMPRESSION: 1. Negative CTA for emergent large vessel occlusion. No hemodynamically significant stenosis or other acute vascular abnormality identified. 2. Prominent and tortuous vessel within the central aspect of the left frontal lobe mass, most characteristic for an enlarged hypertrophied feeder vessel. No definite underlying AVM or other vascular abnormality. CT VENOGRAM IMPRESSION: Negative CT venogram. No evidence for dural sinus thrombosis or other abnormality. Results were discussed by telephone at the time of interpretation on 07/13/2018 at 10:30 pm with Dr. Samara Snide. Electronically Signed   By: Jeannine Boga M.D.   On: 07/13/2018 23:20   Mr Jeri Cos ES Contrast  Result Date: 07/14/2018 CLINICAL DATA:  Follow-up examination for acute stroke, mass on prior CT. EXAM: MRI HEAD WITHOUT AND WITH CONTRAST TECHNIQUE: Multiplanar, multiecho pulse sequences of the brain and surrounding structures were obtained without and with intravenous contrast. CONTRAST:  9 cc of Gadavist.  COMPARISON:  Prior CT and CTA from 07/13/2018. FINDINGS: Brain: Again seen is a large well-circumscribed mass centered at the left frontal convexity. Lesion measures 5.3 x 5.6 x 5.2 cm in maximal dimensions (AP by transverse by craniocaudad). Thin rim/cleft of CSF seen surrounding the lesion on T2 weighted sequence, consistent with an extra-axial mass. Lesion demonstrates hypointense precontrast T1  signal intensity with homogeneous solid postcontrast enhancement. Finding most consistent with a meningioma. Prominent flow void seen coursing from the overlying meninges to the central size deep aspect of the lesion, consistent with a large arterial feeder vessel (series 10, image 22). Overlying dural thickening and enhancement. Scattered areas of internal susceptibility artifact likely reflect blood products. Small amount of surrounding vasogenic edema within the adjacent frontal lobe. Regional mass effect with associated 7 mm of left-to-right shift. Left lateral ventricle partially compressed. No hydrocephalus or ventricular trapping. Basilar cisterns remain patent. Remainder the brain is normal in appearance. No evidence for acute or subacute infarct. Gray-white matter differentiation otherwise maintained. No areas of remote cortical infarction. No other evidence for acute or chronic intracranial hemorrhage. No other mass lesion or abnormal enhancement. Pituitary gland suprasellar region within normal limits. Midline structures intact. Vascular: Major intracranial vascular flow voids are maintained. Skull and upper cervical spine: Craniocervical junction within normal limits. Upper cervical spine normal. Bone marrow signal intensity within normal limits. No scalp soft tissue abnormality. Few scattered dural calcifications again noted. Sinuses/Orbits: Globes and orbital soft tissues within normal limits. Paranasal sinuses are clear. No mastoid effusion. Inner ear structures grossly normal. Other: None. IMPRESSION: 1.  5.3 x 5.6 x 5.2 cm extra-axial mass overlying the left frontal convexity, most consistent with meningioma. Associated regional mass effect with up to 7 mm of left-to-right shift. 2. Otherwise normal brain MRI. Electronically Signed   By: Jeannine Boga M.D.   On: 07/14/2018 01:27   Ct Venogram Head  Result Date: 07/13/2018 CLINICAL DATA:  Initial evaluation for acute altered mental status. EXAM: CT HEAD WITHOUT CONTRAST CT ANGIOGRAPHY HEAD AND NECK CT HEAD VENOGRAM TECHNIQUE: Multidetector CT imaging of the head and neck was performed using the standard protocol during bolus administration of intravenous contrast. Multiplanar CT image reconstructions and MIPs were obtained to evaluate the vascular anatomy. Carotid stenosis measurements (when applicable) are obtained utilizing NASCET criteria, using the distal internal carotid diameter as the denominator. CONTRAST:  63mL OMNIPAQUE IOHEXOL 350 MG/ML SOLN COMPARISON:  None available. FINDINGS: CT HEAD FINDINGS Brain: There is a large homogeneous Lea hyperdense lesion positioned at the left frontal lobe that measures 5.2 x 5.3 x 4.8 cm (transverse by AP by craniocaudad), felt to be most consistent with an underlying mass lesion, likely meningioma. Associated mild localized vasogenic edema. Regional mass effect with up to 7 mm of left-to-right shift. Left lateral ventricle is partially effaced. No hydrocephalus or ventricular trapping at this time. Basilar cisterns remain patent. No other mass lesion. No definite acute intracranial hemorrhage. No acute large vessel territory infarct. No extra-axial fluid collection. Vascular: No hyperdense vessel. Skull: Scalp soft tissues demonstrate no acute abnormality. Subtle permeated of change noted at the left frontal calvarium at the level of the left frontal mass (series 4, image 69). Few scattered prominent dural calcifications noted. Sinuses/Orbits: Globes and orbital soft tissues demonstrate no acute finding. Mild  scattered mucosal thickening throughout the ethmoidal air cells and maxillary sinuses. Paranasal sinuses are otherwise clear. No mastoid effusion. Middle ear cavities clear. Other: None. ASPECTS ([place] Stroke Program Early CT Score) - Ganglionic level infarction (caudate, lentiform nuclei, internal capsule, insula, M1-M3 cortex): 7 - Supraganglionic infarction (M4-M6 cortex): 3 Total score (0-10 with 10 being normal): 10 CTA NECK FINDINGS Aortic arch: Visualized aortic arch of normal caliber with normal branch pattern. No flow-limiting stenosis about the origin of the great vessels. Visualized subclavian arteries widely patent. Right carotid system: Right common and internal carotid  arteries are widely patent without stenosis, dissection, or occlusion. No atheromatous narrowing about the right carotid bifurcation. Left carotid system: Left common and internal carotid arteries are widely patent without stenosis, dissection or occlusion. No atheromatous narrowing about the left carotid bifurcation. Vertebral arteries: Both vertebral arteries arise from the subclavian arteries. Vertebral arteries widely patent within the neck without stenosis, dissection or occlusion. Skeleton: No acute osseous abnormality. No discrete lytic or blastic osseous lesions. Other neck: No other acute soft tissue abnormality within the neck. Upper chest: Visualized upper chest demonstrates no acute finding. Review of the MIP images confirms the above findings CTA HEAD FINDINGS Anterior circulation: Internal carotid arteries widely patent to the termini without stenosis. A1 segments widely patent. Normal anterior communicating artery complex. Anterior cerebral arteries deviated to the right but widely patent to their distal aspects without stenosis. No M1 stenosis or occlusion. Normal MCA bifurcations. Distal MCA branches well perfused and symmetric. Posterior circulation: Both vertebral arteries widely patent to the vertebrobasilar  junction. Left vertebral artery dominant. Posteroinferior cerebral arteries patent bilaterally. Basilar artery widely patent to its distal aspect without stenosis. Superior cerebral arteries patent bilaterally. Right PCA supplied mainly via the basilar. Hypoplastic left P1 with prominent left posterior communicating artery. PCAs well perfused to their distal aspects. Venous sinuses: Major dural sinuses including the superior sagittal sinus, transverse sinuses, and sigmoid sinuses appear patent as do the visualized internal jugular veins. Right transverse sinus dominant, with hypoplastic left transverse sinus. Straight sinus, vein of Galen, and internal cerebral veins patent. No evidence for dural sinus thrombosis. CT venogram confirms these findings. Anatomic variants: None significant.  No intracranial aneurysm. Delayed phase: Not performed. Left frontal mass lesion does demonstrate some intrinsic enhancement on delayed venogram images. Additionally, note made of a prominent feeder vessel extending from the overlying meninges on arterial phase images (series 7, image 38). No definite underlying AVM or other vascular abnormality. Review of the MIP images confirms the above findings IMPRESSION: CT HEAD IMPRESSION: 1. 5.2 x 5.3 x 4.8 cm homogeneous hyperdense mass centered at the left frontal lobe, most characteristic for a meningioma. Associated regional mass effect with up to 7 mm of left-to-right shift. No hydrocephalus or ventricular trapping. Further assessment with dedicated MRI, with and without contrast, recommended for further characterization. 2. Otherwise negative head CT. 3. ASPECTS = 10 CTA HEAD AND NECK IMPRESSION: 1. Negative CTA for emergent large vessel occlusion. No hemodynamically significant stenosis or other acute vascular abnormality identified. 2. Prominent and tortuous vessel within the central aspect of the left frontal lobe mass, most characteristic for an enlarged hypertrophied feeder  vessel. No definite underlying AVM or other vascular abnormality. CT VENOGRAM IMPRESSION: Negative CT venogram. No evidence for dural sinus thrombosis or other abnormality. Results were discussed by telephone at the time of interpretation on 07/13/2018 at 10:30 pm with Dr. Samara Snide. Electronically Signed   By: Jeannine Boga M.D.   On: 07/13/2018 23:20   Ct Head Code Stroke Wo Contrast  Result Date: 07/13/2018 CLINICAL DATA:  Initial evaluation for acute altered mental status. EXAM: CT HEAD WITHOUT CONTRAST CT ANGIOGRAPHY HEAD AND NECK CT HEAD VENOGRAM TECHNIQUE: Multidetector CT imaging of the head and neck was performed using the standard protocol during bolus administration of intravenous contrast. Multiplanar CT image reconstructions and MIPs were obtained to evaluate the vascular anatomy. Carotid stenosis measurements (when applicable) are obtained utilizing NASCET criteria, using the distal internal carotid diameter as the denominator. CONTRAST:  46mL OMNIPAQUE IOHEXOL 350 MG/ML SOLN COMPARISON:  None available. FINDINGS: CT HEAD FINDINGS Brain: There is a large homogeneous Lea hyperdense lesion positioned at the left frontal lobe that measures 5.2 x 5.3 x 4.8 cm (transverse by AP by craniocaudad), felt to be most consistent with an underlying mass lesion, likely meningioma. Associated mild localized vasogenic edema. Regional mass effect with up to 7 mm of left-to-right shift. Left lateral ventricle is partially effaced. No hydrocephalus or ventricular trapping at this time. Basilar cisterns remain patent. No other mass lesion. No definite acute intracranial hemorrhage. No acute large vessel territory infarct. No extra-axial fluid collection. Vascular: No hyperdense vessel. Skull: Scalp soft tissues demonstrate no acute abnormality. Subtle permeated of change noted at the left frontal calvarium at the level of the left frontal mass (series 4, image 69). Few scattered prominent dural calcifications  noted. Sinuses/Orbits: Globes and orbital soft tissues demonstrate no acute finding. Mild scattered mucosal thickening throughout the ethmoidal air cells and maxillary sinuses. Paranasal sinuses are otherwise clear. No mastoid effusion. Middle ear cavities clear. Other: None. ASPECTS ([place] Stroke Program Early CT Score) - Ganglionic level infarction (caudate, lentiform nuclei, internal capsule, insula, M1-M3 cortex): 7 - Supraganglionic infarction (M4-M6 cortex): 3 Total score (0-10 with 10 being normal): 10 CTA NECK FINDINGS Aortic arch: Visualized aortic arch of normal caliber with normal branch pattern. No flow-limiting stenosis about the origin of the great vessels. Visualized subclavian arteries widely patent. Right carotid system: Right common and internal carotid arteries are widely patent without stenosis, dissection, or occlusion. No atheromatous narrowing about the right carotid bifurcation. Left carotid system: Left common and internal carotid arteries are widely patent without stenosis, dissection or occlusion. No atheromatous narrowing about the left carotid bifurcation. Vertebral arteries: Both vertebral arteries arise from the subclavian arteries. Vertebral arteries widely patent within the neck without stenosis, dissection or occlusion. Skeleton: No acute osseous abnormality. No discrete lytic or blastic osseous lesions. Other neck: No other acute soft tissue abnormality within the neck. Upper chest: Visualized upper chest demonstrates no acute finding. Review of the MIP images confirms the above findings CTA HEAD FINDINGS Anterior circulation: Internal carotid arteries widely patent to the termini without stenosis. A1 segments widely patent. Normal anterior communicating artery complex. Anterior cerebral arteries deviated to the right but widely patent to their distal aspects without stenosis. No M1 stenosis or occlusion. Normal MCA bifurcations. Distal MCA branches well perfused and symmetric.  Posterior circulation: Both vertebral arteries widely patent to the vertebrobasilar junction. Left vertebral artery dominant. Posteroinferior cerebral arteries patent bilaterally. Basilar artery widely patent to its distal aspect without stenosis. Superior cerebral arteries patent bilaterally. Right PCA supplied mainly via the basilar. Hypoplastic left P1 with prominent left posterior communicating artery. PCAs well perfused to their distal aspects. Venous sinuses: Major dural sinuses including the superior sagittal sinus, transverse sinuses, and sigmoid sinuses appear patent as do the visualized internal jugular veins. Right transverse sinus dominant, with hypoplastic left transverse sinus. Straight sinus, vein of Galen, and internal cerebral veins patent. No evidence for dural sinus thrombosis. CT venogram confirms these findings. Anatomic variants: None significant.  No intracranial aneurysm. Delayed phase: Not performed. Left frontal mass lesion does demonstrate some intrinsic enhancement on delayed venogram images. Additionally, note made of a prominent feeder vessel extending from the overlying meninges on arterial phase images (series 7, image 38). No definite underlying AVM or other vascular abnormality. Review of the MIP images confirms the above findings IMPRESSION: CT HEAD IMPRESSION: 1. 5.2 x 5.3 x 4.8 cm homogeneous hyperdense mass centered at  the left frontal lobe, most characteristic for a meningioma. Associated regional mass effect with up to 7 mm of left-to-right shift. No hydrocephalus or ventricular trapping. Further assessment with dedicated MRI, with and without contrast, recommended for further characterization. 2. Otherwise negative head CT. 3. ASPECTS = 10 CTA HEAD AND NECK IMPRESSION: 1. Negative CTA for emergent large vessel occlusion. No hemodynamically significant stenosis or other acute vascular abnormality identified. 2. Prominent and tortuous vessel within the central aspect of the  left frontal lobe mass, most characteristic for an enlarged hypertrophied feeder vessel. No definite underlying AVM or other vascular abnormality. CT VENOGRAM IMPRESSION: Negative CT venogram. No evidence for dural sinus thrombosis or other abnormality. Results were discussed by telephone at the time of interpretation on 07/13/2018 at 10:30 pm with Dr. Samara Snide. Electronically Signed   By: Jeannine Boga M.D.   On: 07/13/2018 23:20    Pending Labs Unresulted Labs (From admission, onward)    Start     Ordered   07/14/18 0146  SARS Coronavirus 2 (CEPHEID - Performed in [facility] hospital lab), Hosp Order  (Asymptomatic Patients Labs)  Once,   STAT    Question:  Rule Out  Answer:  Yes   07/14/18 0146          Vitals/Pain Today's Vitals   07/13/18 2315 07/13/18 2330 07/14/18 0130 07/14/18 0145  BP: (!) 135/93 125/76 121/83 129/83  Pulse: 93  85 94  Resp: 13 17    Temp:      TempSrc:      SpO2: 97%  97% 98%  PainSc:        Isolation Precautions No active isolations  Medications Medications  sodium chloride flush (NS) 0.9 % injection 3 mL (has no administration in time range)  levETIRAcetam (KEPPRA) IVPB 500 mg/100 mL premix (500 mg Intravenous New Bag/Given 07/14/18 0246)  iohexol (OMNIPAQUE) 350 MG/ML injection 75 mL (75 mLs Intravenous Contrast Given 07/13/18 2211)  LORazepam (ATIVAN) injection 1 mg (1 mg Intravenous Given 07/13/18 2330)  gadobutrol (GADAVIST) 1 MMOL/ML injection 9 mL (9 mLs Intravenous Contrast Given 07/14/18 0033)  dexamethasone (DECADRON) injection 10 mg (10 mg Intravenous Given 07/14/18 0225)    Mobility walks Moderate fall risk   Focused Assessments Neuro Assessment Handoff:  Swallow screen pass? Yes    NIH Stroke Scale ( + Modified Stroke Scale Criteria)  Interval: Initial Level of Consciousness (1a.)   : Alert, keenly responsive LOC Questions (1b. )   +: Answers one question correctly LOC Commands (1c. )   + : Performs both tasks  correctly Best Gaze (2. )  +: Normal Visual (3. )  +: No visual loss Facial Palsy (4. )    : Minor paralysis Motor Arm, Left (5a. )   +: No drift Motor Arm, Right (5b. )   +: No drift Motor Leg, Left (6a. )   +: No drift Motor Leg, Right (6b. )   +: Drift Limb Ataxia (7. ): Absent Sensory (8. )   +: Normal, no sensory loss Best Language (9. )   +: Severe aphasia Dysarthria (10. ): Normal Extinction/Inattention (11.)   +: No Abnormality Modified SS Total  +: 4 Complete NIHSS TOTAL: 5 Last date known well: 07/13/18 Last time known well: 0800 Neuro Assessment: Exceptions to WDL Neuro Checks:   Initial (07/13/18 2148)  Last Documented NIHSS Modified Score: 4 (07/13/18 2306) Has TPA been given? No If patient is a Neuro Trauma and patient is going to OR before  floor call report to [place] nurse: [phone] or [phone]     R Recommendations: See Admitting Provider Note  Report given to:   Additional Notes:

## 2018-07-14 NOTE — Progress Notes (Signed)
  NEUROSURGERY PROGRESS NOTE   Pt seen with husband at bedside. Admission history reviewed. Pt presenting 10d post-partum with speech difficulty. Stroke workup demonstrated large left meningioma. This will require resection, planned on Monday. I was asked by my partner, Dr. Ronnald Ramp, to evaluate for possible preoperative angiogram and embolization.  EXAM:  BP 113/69 (BP Location: Right Arm)   Pulse 79   Temp 97.6 F (36.4 C) (Oral)   Resp 15   SpO2 94%   Awake, alert,  Speech broken, difficulty with naming and repetition CN grossly intact  5/5 BUE/BLE   IMAGING: MRI brain reviewed demonstrating large left frontal convexity meningioma which appears distinct from the SSS. There is significant associated local mass effect and some MLS. No HCP.  IMPRESSION:  38 y.o. female 10d postpartum with acute onset aphasia related to large left frontal convexity meningioma. Planning on resection on Monday. I think preoperative angiogram for possible embolization would be reasonable.  PLAN: - NPO p MN - Will plan on angiogram, possible tumor embolization tomorrow  I reviewed the situation with the patient and her husband. The rationale for preop angiogram with embolization was discussed. Risks of the procedure including risk of stroke, arterial dissection, contrast reaction, and nephropathy was reviewed. Pt seemed to indicate understanding as did her husband. All questions were answered and they both indicated consent to proceed tomorrow as above.

## 2018-07-14 NOTE — Progress Notes (Signed)
EEG completed, results pending. 

## 2018-07-14 NOTE — Progress Notes (Signed)
Patient ID: April Peterson, female   DOB: [birth year], 38 y.o.   MRN: [id number] Subjective: Patient reports no real headache at this time.  Speech much better.  Objective: Vital signs in last 24 hours: Temp:  [97.6 F (36.4 C)-100.3 F (37.9 C)] 97.6 F (36.4 C) (07/09 1224) Pulse Rate:  [79-100] 79 (07/09 1224) Resp:  [13-19] 15 (07/09 1224) BP: (113-170)/(69-97) 113/69 (07/09 1224) SpO2:  [94 %-100 %] 94 % (07/09 1224)  Intake/Output from previous day: 07/08 0701 - 07/09 0700 In: 100.6 [IV Piggyback:100.6] Out: -  Intake/Output this shift: No intake/output data recorded.  Neurologic: Grossly normal  except  for mild word finding difficulties at times  Lab Results: Lab Results  Component Value Date   WBC 12.4 (H) 07/13/2018   HGB 14.6 07/13/2018   HCT 43.0 07/13/2018   MCV 92.3 07/13/2018   PLT 286 07/13/2018   Lab Results  Component Value Date   INR 1.1 07/13/2018   BMET Lab Results  Component Value Date   NA 141 07/13/2018   K 4.0 07/13/2018   CL 109 07/13/2018   CO2 22 07/13/2018   GLUCOSE 99 07/13/2018   BUN 17 07/13/2018   CREATININE 0.70 07/13/2018   CALCIUM 9.4 07/13/2018    Studies/Results: Ct Angio Head W Or Wo Contrast  Result Date: 07/13/2018 CLINICAL DATA:  Initial evaluation for acute altered mental status. EXAM: CT HEAD WITHOUT CONTRAST CT ANGIOGRAPHY HEAD AND NECK CT HEAD VENOGRAM TECHNIQUE: Multidetector CT imaging of the head and neck was performed using the standard protocol during bolus administration of intravenous contrast. Multiplanar CT image reconstructions and MIPs were obtained to evaluate the vascular anatomy. Carotid stenosis measurements (when applicable) are obtained utilizing NASCET criteria, using the distal internal carotid diameter as the denominator. CONTRAST:  35mL OMNIPAQUE IOHEXOL 350 MG/ML SOLN COMPARISON:  None available. FINDINGS: CT HEAD FINDINGS Brain: There is a large homogeneous Lea hyperdense lesion positioned at the  left frontal lobe that measures 5.2 x 5.3 x 4.8 cm (transverse by AP by craniocaudad), felt to be most consistent with an underlying mass lesion, likely meningioma. Associated mild localized vasogenic edema. Regional mass effect with up to 7 mm of left-to-right shift. Left lateral ventricle is partially effaced. No hydrocephalus or ventricular trapping at this time. Basilar cisterns remain patent. No other mass lesion. No definite acute intracranial hemorrhage. No acute large vessel territory infarct. No extra-axial fluid collection. Vascular: No hyperdense vessel. Skull: Scalp soft tissues demonstrate no acute abnormality. Subtle permeated of change noted at the left frontal calvarium at the level of the left frontal mass (series 4, image 69). Few scattered prominent dural calcifications noted. Sinuses/Orbits: Globes and orbital soft tissues demonstrate no acute finding. Mild scattered mucosal thickening throughout the ethmoidal air cells and maxillary sinuses. Paranasal sinuses are otherwise clear. No mastoid effusion. Middle ear cavities clear. Other: None. ASPECTS ([place] Stroke Program Early CT Score) - Ganglionic level infarction (caudate, lentiform nuclei, internal capsule, insula, M1-M3 cortex): 7 - Supraganglionic infarction (M4-M6 cortex): 3 Total score (0-10 with 10 being normal): 10 CTA NECK FINDINGS Aortic arch: Visualized aortic arch of normal caliber with normal branch pattern. No flow-limiting stenosis about the origin of the great vessels. Visualized subclavian arteries widely patent. Right carotid system: Right common and internal carotid arteries are widely patent without stenosis, dissection, or occlusion. No atheromatous narrowing about the right carotid bifurcation. Left carotid system: Left common and internal carotid arteries are widely patent without stenosis, dissection or occlusion. No atheromatous narrowing  about the left carotid bifurcation. Vertebral arteries: Both vertebral arteries  arise from the subclavian arteries. Vertebral arteries widely patent within the neck without stenosis, dissection or occlusion. Skeleton: No acute osseous abnormality. No discrete lytic or blastic osseous lesions. Other neck: No other acute soft tissue abnormality within the neck. Upper chest: Visualized upper chest demonstrates no acute finding. Review of the MIP images confirms the above findings CTA HEAD FINDINGS Anterior circulation: Internal carotid arteries widely patent to the termini without stenosis. A1 segments widely patent. Normal anterior communicating artery complex. Anterior cerebral arteries deviated to the right but widely patent to their distal aspects without stenosis. No M1 stenosis or occlusion. Normal MCA bifurcations. Distal MCA branches well perfused and symmetric. Posterior circulation: Both vertebral arteries widely patent to the vertebrobasilar junction. Left vertebral artery dominant. Posteroinferior cerebral arteries patent bilaterally. Basilar artery widely patent to its distal aspect without stenosis. Superior cerebral arteries patent bilaterally. Right PCA supplied mainly via the basilar. Hypoplastic left P1 with prominent left posterior communicating artery. PCAs well perfused to their distal aspects. Venous sinuses: Major dural sinuses including the superior sagittal sinus, transverse sinuses, and sigmoid sinuses appear patent as do the visualized internal jugular veins. Right transverse sinus dominant, with hypoplastic left transverse sinus. Straight sinus, vein of Galen, and internal cerebral veins patent. No evidence for dural sinus thrombosis. CT venogram confirms these findings. Anatomic variants: None significant.  No intracranial aneurysm. Delayed phase: Not performed. Left frontal mass lesion does demonstrate some intrinsic enhancement on delayed venogram images. Additionally, note made of a prominent feeder vessel extending from the overlying meninges on arterial phase  images (series 7, image 38). No definite underlying AVM or other vascular abnormality. Review of the MIP images confirms the above findings IMPRESSION: CT HEAD IMPRESSION: 1. 5.2 x 5.3 x 4.8 cm homogeneous hyperdense mass centered at the left frontal lobe, most characteristic for a meningioma. Associated regional mass effect with up to 7 mm of left-to-right shift. No hydrocephalus or ventricular trapping. Further assessment with dedicated MRI, with and without contrast, recommended for further characterization. 2. Otherwise negative head CT. 3. ASPECTS = 10 CTA HEAD AND NECK IMPRESSION: 1. Negative CTA for emergent large vessel occlusion. No hemodynamically significant stenosis or other acute vascular abnormality identified. 2. Prominent and tortuous vessel within the central aspect of the left frontal lobe mass, most characteristic for an enlarged hypertrophied feeder vessel. No definite underlying AVM or other vascular abnormality. CT VENOGRAM IMPRESSION: Negative CT venogram. No evidence for dural sinus thrombosis or other abnormality. Results were discussed by telephone at the time of interpretation on 07/13/2018 at 10:30 pm with Dr. Samara Snide. Electronically Signed   By: Jeannine Boga M.D.   On: 07/13/2018 23:20   Ct Angio Neck W Or Wo Contrast  Result Date: 07/13/2018 CLINICAL DATA:  Initial evaluation for acute altered mental status. EXAM: CT HEAD WITHOUT CONTRAST CT ANGIOGRAPHY HEAD AND NECK CT HEAD VENOGRAM TECHNIQUE: Multidetector CT imaging of the head and neck was performed using the standard protocol during bolus administration of intravenous contrast. Multiplanar CT image reconstructions and MIPs were obtained to evaluate the vascular anatomy. Carotid stenosis measurements (when applicable) are obtained utilizing NASCET criteria, using the distal internal carotid diameter as the denominator. CONTRAST:  70mL OMNIPAQUE IOHEXOL 350 MG/ML SOLN COMPARISON:  None available. FINDINGS: CT HEAD  FINDINGS Brain: There is a large homogeneous Lea hyperdense lesion positioned at the left frontal lobe that measures 5.2 x 5.3 x 4.8 cm (transverse by AP by craniocaudad),  felt to be most consistent with an underlying mass lesion, likely meningioma. Associated mild localized vasogenic edema. Regional mass effect with up to 7 mm of left-to-right shift. Left lateral ventricle is partially effaced. No hydrocephalus or ventricular trapping at this time. Basilar cisterns remain patent. No other mass lesion. No definite acute intracranial hemorrhage. No acute large vessel territory infarct. No extra-axial fluid collection. Vascular: No hyperdense vessel. Skull: Scalp soft tissues demonstrate no acute abnormality. Subtle permeated of change noted at the left frontal calvarium at the level of the left frontal mass (series 4, image 69). Few scattered prominent dural calcifications noted. Sinuses/Orbits: Globes and orbital soft tissues demonstrate no acute finding. Mild scattered mucosal thickening throughout the ethmoidal air cells and maxillary sinuses. Paranasal sinuses are otherwise clear. No mastoid effusion. Middle ear cavities clear. Other: None. ASPECTS ([place] Stroke Program Early CT Score) - Ganglionic level infarction (caudate, lentiform nuclei, internal capsule, insula, M1-M3 cortex): 7 - Supraganglionic infarction (M4-M6 cortex): 3 Total score (0-10 with 10 being normal): 10 CTA NECK FINDINGS Aortic arch: Visualized aortic arch of normal caliber with normal branch pattern. No flow-limiting stenosis about the origin of the great vessels. Visualized subclavian arteries widely patent. Right carotid system: Right common and internal carotid arteries are widely patent without stenosis, dissection, or occlusion. No atheromatous narrowing about the right carotid bifurcation. Left carotid system: Left common and internal carotid arteries are widely patent without stenosis, dissection or occlusion. No atheromatous  narrowing about the left carotid bifurcation. Vertebral arteries: Both vertebral arteries arise from the subclavian arteries. Vertebral arteries widely patent within the neck without stenosis, dissection or occlusion. Skeleton: No acute osseous abnormality. No discrete lytic or blastic osseous lesions. Other neck: No other acute soft tissue abnormality within the neck. Upper chest: Visualized upper chest demonstrates no acute finding. Review of the MIP images confirms the above findings CTA HEAD FINDINGS Anterior circulation: Internal carotid arteries widely patent to the termini without stenosis. A1 segments widely patent. Normal anterior communicating artery complex. Anterior cerebral arteries deviated to the right but widely patent to their distal aspects without stenosis. No M1 stenosis or occlusion. Normal MCA bifurcations. Distal MCA branches well perfused and symmetric. Posterior circulation: Both vertebral arteries widely patent to the vertebrobasilar junction. Left vertebral artery dominant. Posteroinferior cerebral arteries patent bilaterally. Basilar artery widely patent to its distal aspect without stenosis. Superior cerebral arteries patent bilaterally. Right PCA supplied mainly via the basilar. Hypoplastic left P1 with prominent left posterior communicating artery. PCAs well perfused to their distal aspects. Venous sinuses: Major dural sinuses including the superior sagittal sinus, transverse sinuses, and sigmoid sinuses appear patent as do the visualized internal jugular veins. Right transverse sinus dominant, with hypoplastic left transverse sinus. Straight sinus, vein of Galen, and internal cerebral veins patent. No evidence for dural sinus thrombosis. CT venogram confirms these findings. Anatomic variants: None significant.  No intracranial aneurysm. Delayed phase: Not performed. Left frontal mass lesion does demonstrate some intrinsic enhancement on delayed venogram images. Additionally, note made  of a prominent feeder vessel extending from the overlying meninges on arterial phase images (series 7, image 38). No definite underlying AVM or other vascular abnormality. Review of the MIP images confirms the above findings IMPRESSION: CT HEAD IMPRESSION: 1. 5.2 x 5.3 x 4.8 cm homogeneous hyperdense mass centered at the left frontal lobe, most characteristic for a meningioma. Associated regional mass effect with up to 7 mm of left-to-right shift. No hydrocephalus or ventricular trapping. Further assessment with dedicated MRI, with and without  contrast, recommended for further characterization. 2. Otherwise negative head CT. 3. ASPECTS = 10 CTA HEAD AND NECK IMPRESSION: 1. Negative CTA for emergent large vessel occlusion. No hemodynamically significant stenosis or other acute vascular abnormality identified. 2. Prominent and tortuous vessel within the central aspect of the left frontal lobe mass, most characteristic for an enlarged hypertrophied feeder vessel. No definite underlying AVM or other vascular abnormality. CT VENOGRAM IMPRESSION: Negative CT venogram. No evidence for dural sinus thrombosis or other abnormality. Results were discussed by telephone at the time of interpretation on 07/13/2018 at 10:30 pm with Dr. Samara Snide. Electronically Signed   By: Jeannine Boga M.D.   On: 07/13/2018 23:20   Mr Jeri Cos NW Contrast  Result Date: 07/14/2018 CLINICAL DATA:  Preoperative planning for left-sided meningioma. Brain lab study. Ten days postpartum. EXAM: MRI HEAD WITHOUT AND WITH CONTRAST TECHNIQUE: Multiplanar, multiecho pulse sequences of the brain and surrounding structures were obtained without and with intravenous contrast. CONTRAST:  9 cc Gadavist COMPARISON:  Earlier same day FINDINGS: Brain: BrainLAB protocol performed for operative guidance. The examination is obviously stable compared earlier today. 5.3 x 5.6 x 5.2 cm meningioma at the left frontal vertex indenting the brain. Left-to-right  midline brain shift of 7 mm. Mild adjacent vasogenic edema. See earlier study for full report. IMPRESSION: [place] study for operative guidance. No change since earlier today. Electronically Signed   By: Nelson Chimes M.D.   On: 07/14/2018 17:36   Mr Jeri Cos GN Contrast  Result Date: 07/14/2018 CLINICAL DATA:  Follow-up examination for acute stroke, mass on prior CT. EXAM: MRI HEAD WITHOUT AND WITH CONTRAST TECHNIQUE: Multiplanar, multiecho pulse sequences of the brain and surrounding structures were obtained without and with intravenous contrast. CONTRAST:  9 cc of Gadavist. COMPARISON:  Prior CT and CTA from 07/13/2018. FINDINGS: Brain: Again seen is a large well-circumscribed mass centered at the left frontal convexity. Lesion measures 5.3 x 5.6 x 5.2 cm in maximal dimensions (AP by transverse by craniocaudad). Thin rim/cleft of CSF seen surrounding the lesion on T2 weighted sequence, consistent with an extra-axial mass. Lesion demonstrates hypointense precontrast T1 signal intensity with homogeneous solid postcontrast enhancement. Finding most consistent with a meningioma. Prominent flow void seen coursing from the overlying meninges to the central size deep aspect of the lesion, consistent with a large arterial feeder vessel (series 10, image 22). Overlying dural thickening and enhancement. Scattered areas of internal susceptibility artifact likely reflect blood products. Small amount of surrounding vasogenic edema within the adjacent frontal lobe. Regional mass effect with associated 7 mm of left-to-right shift. Left lateral ventricle partially compressed. No hydrocephalus or ventricular trapping. Basilar cisterns remain patent. Remainder the brain is normal in appearance. No evidence for acute or subacute infarct. Gray-white matter differentiation otherwise maintained. No areas of remote cortical infarction. No other evidence for acute or chronic intracranial hemorrhage. No other mass lesion or abnormal  enhancement. Pituitary gland suprasellar region within normal limits. Midline structures intact. Vascular: Major intracranial vascular flow voids are maintained. Skull and upper cervical spine: Craniocervical junction within normal limits. Upper cervical spine normal. Bone marrow signal intensity within normal limits. No scalp soft tissue abnormality. Few scattered dural calcifications again noted. Sinuses/Orbits: Globes and orbital soft tissues within normal limits. Paranasal sinuses are clear. No mastoid effusion. Inner ear structures grossly normal. Other: None. IMPRESSION: 1. 5.3 x 5.6 x 5.2 cm extra-axial mass overlying the left frontal convexity, most consistent with meningioma. Associated regional mass effect with up to 7 mm  of left-to-right shift. 2. Otherwise normal brain MRI. Electronically Signed   By: Jeannine Boga M.D.   On: 07/14/2018 01:27   Ct Venogram Head  Result Date: 07/13/2018 CLINICAL DATA:  Initial evaluation for acute altered mental status. EXAM: CT HEAD WITHOUT CONTRAST CT ANGIOGRAPHY HEAD AND NECK CT HEAD VENOGRAM TECHNIQUE: Multidetector CT imaging of the head and neck was performed using the standard protocol during bolus administration of intravenous contrast. Multiplanar CT image reconstructions and MIPs were obtained to evaluate the vascular anatomy. Carotid stenosis measurements (when applicable) are obtained utilizing NASCET criteria, using the distal internal carotid diameter as the denominator. CONTRAST:  4mL OMNIPAQUE IOHEXOL 350 MG/ML SOLN COMPARISON:  None available. FINDINGS: CT HEAD FINDINGS Brain: There is a large homogeneous Lea hyperdense lesion positioned at the left frontal lobe that measures 5.2 x 5.3 x 4.8 cm (transverse by AP by craniocaudad), felt to be most consistent with an underlying mass lesion, likely meningioma. Associated mild localized vasogenic edema. Regional mass effect with up to 7 mm of left-to-right shift. Left lateral ventricle is partially  effaced. No hydrocephalus or ventricular trapping at this time. Basilar cisterns remain patent. No other mass lesion. No definite acute intracranial hemorrhage. No acute large vessel territory infarct. No extra-axial fluid collection. Vascular: No hyperdense vessel. Skull: Scalp soft tissues demonstrate no acute abnormality. Subtle permeated of change noted at the left frontal calvarium at the level of the left frontal mass (series 4, image 69). Few scattered prominent dural calcifications noted. Sinuses/Orbits: Globes and orbital soft tissues demonstrate no acute finding. Mild scattered mucosal thickening throughout the ethmoidal air cells and maxillary sinuses. Paranasal sinuses are otherwise clear. No mastoid effusion. Middle ear cavities clear. Other: None. ASPECTS ([place] Stroke Program Early CT Score) - Ganglionic level infarction (caudate, lentiform nuclei, internal capsule, insula, M1-M3 cortex): 7 - Supraganglionic infarction (M4-M6 cortex): 3 Total score (0-10 with 10 being normal): 10 CTA NECK FINDINGS Aortic arch: Visualized aortic arch of normal caliber with normal branch pattern. No flow-limiting stenosis about the origin of the great vessels. Visualized subclavian arteries widely patent. Right carotid system: Right common and internal carotid arteries are widely patent without stenosis, dissection, or occlusion. No atheromatous narrowing about the right carotid bifurcation. Left carotid system: Left common and internal carotid arteries are widely patent without stenosis, dissection or occlusion. No atheromatous narrowing about the left carotid bifurcation. Vertebral arteries: Both vertebral arteries arise from the subclavian arteries. Vertebral arteries widely patent within the neck without stenosis, dissection or occlusion. Skeleton: No acute osseous abnormality. No discrete lytic or blastic osseous lesions. Other neck: No other acute soft tissue abnormality within the neck. Upper chest: Visualized  upper chest demonstrates no acute finding. Review of the MIP images confirms the above findings CTA HEAD FINDINGS Anterior circulation: Internal carotid arteries widely patent to the termini without stenosis. A1 segments widely patent. Normal anterior communicating artery complex. Anterior cerebral arteries deviated to the right but widely patent to their distal aspects without stenosis. No M1 stenosis or occlusion. Normal MCA bifurcations. Distal MCA branches well perfused and symmetric. Posterior circulation: Both vertebral arteries widely patent to the vertebrobasilar junction. Left vertebral artery dominant. Posteroinferior cerebral arteries patent bilaterally. Basilar artery widely patent to its distal aspect without stenosis. Superior cerebral arteries patent bilaterally. Right PCA supplied mainly via the basilar. Hypoplastic left P1 with prominent left posterior communicating artery. PCAs well perfused to their distal aspects. Venous sinuses: Major dural sinuses including the superior sagittal sinus, transverse sinuses, and sigmoid sinuses appear  patent as do the visualized internal jugular veins. Right transverse sinus dominant, with hypoplastic left transverse sinus. Straight sinus, vein of Galen, and internal cerebral veins patent. No evidence for dural sinus thrombosis. CT venogram confirms these findings. Anatomic variants: None significant.  No intracranial aneurysm. Delayed phase: Not performed. Left frontal mass lesion does demonstrate some intrinsic enhancement on delayed venogram images. Additionally, note made of a prominent feeder vessel extending from the overlying meninges on arterial phase images (series 7, image 38). No definite underlying AVM or other vascular abnormality. Review of the MIP images confirms the above findings IMPRESSION: CT HEAD IMPRESSION: 1. 5.2 x 5.3 x 4.8 cm homogeneous hyperdense mass centered at the left frontal lobe, most characteristic for a meningioma. Associated  regional mass effect with up to 7 mm of left-to-right shift. No hydrocephalus or ventricular trapping. Further assessment with dedicated MRI, with and without contrast, recommended for further characterization. 2. Otherwise negative head CT. 3. ASPECTS = 10 CTA HEAD AND NECK IMPRESSION: 1. Negative CTA for emergent large vessel occlusion. No hemodynamically significant stenosis or other acute vascular abnormality identified. 2. Prominent and tortuous vessel within the central aspect of the left frontal lobe mass, most characteristic for an enlarged hypertrophied feeder vessel. No definite underlying AVM or other vascular abnormality. CT VENOGRAM IMPRESSION: Negative CT venogram. No evidence for dural sinus thrombosis or other abnormality. Results were discussed by telephone at the time of interpretation on 07/13/2018 at 10:30 pm with Dr. Samara Snide. Electronically Signed   By: Jeannine Boga M.D.   On: 07/13/2018 23:20   Ct Head Code Stroke Wo Contrast  Result Date: 07/13/2018 CLINICAL DATA:  Initial evaluation for acute altered mental status. EXAM: CT HEAD WITHOUT CONTRAST CT ANGIOGRAPHY HEAD AND NECK CT HEAD VENOGRAM TECHNIQUE: Multidetector CT imaging of the head and neck was performed using the standard protocol during bolus administration of intravenous contrast. Multiplanar CT image reconstructions and MIPs were obtained to evaluate the vascular anatomy. Carotid stenosis measurements (when applicable) are obtained utilizing NASCET criteria, using the distal internal carotid diameter as the denominator. CONTRAST:  85mL OMNIPAQUE IOHEXOL 350 MG/ML SOLN COMPARISON:  None available. FINDINGS: CT HEAD FINDINGS Brain: There is a large homogeneous Lea hyperdense lesion positioned at the left frontal lobe that measures 5.2 x 5.3 x 4.8 cm (transverse by AP by craniocaudad), felt to be most consistent with an underlying mass lesion, likely meningioma. Associated mild localized vasogenic edema. Regional mass  effect with up to 7 mm of left-to-right shift. Left lateral ventricle is partially effaced. No hydrocephalus or ventricular trapping at this time. Basilar cisterns remain patent. No other mass lesion. No definite acute intracranial hemorrhage. No acute large vessel territory infarct. No extra-axial fluid collection. Vascular: No hyperdense vessel. Skull: Scalp soft tissues demonstrate no acute abnormality. Subtle permeated of change noted at the left frontal calvarium at the level of the left frontal mass (series 4, image 69). Few scattered prominent dural calcifications noted. Sinuses/Orbits: Globes and orbital soft tissues demonstrate no acute finding. Mild scattered mucosal thickening throughout the ethmoidal air cells and maxillary sinuses. Paranasal sinuses are otherwise clear. No mastoid effusion. Middle ear cavities clear. Other: None. ASPECTS ([place] Stroke Program Early CT Score) - Ganglionic level infarction (caudate, lentiform nuclei, internal capsule, insula, M1-M3 cortex): 7 - Supraganglionic infarction (M4-M6 cortex): 3 Total score (0-10 with 10 being normal): 10 CTA NECK FINDINGS Aortic arch: Visualized aortic arch of normal caliber with normal branch pattern. No flow-limiting stenosis about the origin of the great  vessels. Visualized subclavian arteries widely patent. Right carotid system: Right common and internal carotid arteries are widely patent without stenosis, dissection, or occlusion. No atheromatous narrowing about the right carotid bifurcation. Left carotid system: Left common and internal carotid arteries are widely patent without stenosis, dissection or occlusion. No atheromatous narrowing about the left carotid bifurcation. Vertebral arteries: Both vertebral arteries arise from the subclavian arteries. Vertebral arteries widely patent within the neck without stenosis, dissection or occlusion. Skeleton: No acute osseous abnormality. No discrete lytic or blastic osseous lesions. Other  neck: No other acute soft tissue abnormality within the neck. Upper chest: Visualized upper chest demonstrates no acute finding. Review of the MIP images confirms the above findings CTA HEAD FINDINGS Anterior circulation: Internal carotid arteries widely patent to the termini without stenosis. A1 segments widely patent. Normal anterior communicating artery complex. Anterior cerebral arteries deviated to the right but widely patent to their distal aspects without stenosis. No M1 stenosis or occlusion. Normal MCA bifurcations. Distal MCA branches well perfused and symmetric. Posterior circulation: Both vertebral arteries widely patent to the vertebrobasilar junction. Left vertebral artery dominant. Posteroinferior cerebral arteries patent bilaterally. Basilar artery widely patent to its distal aspect without stenosis. Superior cerebral arteries patent bilaterally. Right PCA supplied mainly via the basilar. Hypoplastic left P1 with prominent left posterior communicating artery. PCAs well perfused to their distal aspects. Venous sinuses: Major dural sinuses including the superior sagittal sinus, transverse sinuses, and sigmoid sinuses appear patent as do the visualized internal jugular veins. Right transverse sinus dominant, with hypoplastic left transverse sinus. Straight sinus, vein of [place], and internal cerebral veins patent. No evidence for dural sinus thrombosis. CT venogram confirms these findings. Anatomic variants: None significant.  No intracranial aneurysm. Delayed phase: Not performed. Left frontal mass lesion does demonstrate some intrinsic enhancement on delayed venogram images. Additionally, note made of a prominent feeder vessel extending from the overlying meninges on arterial phase images (series 7, image 38). No definite underlying AVM or other vascular abnormality. Review of the MIP images confirms the above findings IMPRESSION: CT HEAD IMPRESSION: 1. 5.2 x 5.3 x 4.8 cm homogeneous hyperdense mass  centered at the left frontal lobe, most characteristic for a meningioma. Associated regional mass effect with up to 7 mm of left-to-right shift. No hydrocephalus or ventricular trapping. Further assessment with dedicated MRI, with and without contrast, recommended for further characterization. 2. Otherwise negative head CT. 3. ASPECTS = 10 CTA HEAD AND NECK IMPRESSION: 1. Negative CTA for emergent large vessel occlusion. No hemodynamically significant stenosis or other acute vascular abnormality identified. 2. Prominent and tortuous vessel within the central aspect of the left frontal lobe mass, most characteristic for an enlarged hypertrophied feeder vessel. No definite underlying AVM or other vascular abnormality. CT VENOGRAM IMPRESSION: Negative CT venogram. No evidence for dural sinus thrombosis or other abnormality. Results were discussed by telephone at the time of interpretation on 07/13/2018 at 10:30 pm with Dr. Samara Snide. Electronically Signed   By: Jeannine Boga M.D.   On: 07/13/2018 23:20    Assessment/Plan: Doing much better.  Much more fluent and has naming.  Still some word finding issues with longer sentences.  No right-sided weakness.  Minimal headache.  Plan for surgery Monday.  Plan for embolization of tumor tomorrow.  Estimated body mass index is 28.7 kg/m as calculated from the following:   Height as of 07/04/18: 5\' 10"  (1.778 m).   Weight as of 07/04/18: 90.7 kg.    LOS: 0 days    Shanon Brow  S Jahne Krukowski 07/14/2018, 6:18 PM

## 2018-07-14 NOTE — H&P (Signed)
Reason for Consult:brain mass Referring Physician: EDP  SEBRINA KESSNER is an 38 y.o. female.   HPI:  38 year old white female seen in neurosurgical consultation regarding a left-sided brain mass consistent with meningioma.  She is 10 days postpartum.  Symptoms started earlier today with some difficulty with making full sentences.  She has had some headaches recently.  The patient is a phasic and has trouble giving history and so much of the history is taken from the husband.  They felt she was dehydrated and had her lay down for a while but then brought her to the emergency department where she had more trouble with word finding.  She was evaluated by neurology.  Code stroke was done and so she had a CT scan and CT angiogram of the head which showed extra-axial mass on the left and MRI confirmed a left frontal meningioma.  She denies headache at this time.  She answer simple yes/no questions.  Past Medical History:  Diagnosis Date  . Medical history non-contributory     Past Surgical History:  Procedure Laterality Date  . WISDOM TOOTH EXTRACTION      No Known Allergies  Social History   Tobacco Use  . Smoking status: Never Smoker  . Smokeless tobacco: Never Used  Substance Use Topics  . Alcohol use: No    Family History  Problem Relation Age of Onset  . Diabetes Father      Review of Systems  Positive ROS: Recent headaches  All other systems have been reviewed and were otherwise negative with the exception of those mentioned in the HPI and as above.  Objective: Vital signs in last 24 hours: Temp:  [98.3 F (36.8 C)] 98.3 F (36.8 C) (07/08 2136) Pulse Rate:  [79-100] 93 (07/08 2315) Resp:  [13-19] 17 (07/08 2330) BP: (124-170)/(76-97) 125/76 (07/08 2330) SpO2:  [97 %-100 %] 97 % (07/08 2315)  General Appearance: Alert, cooperative, no distress, appears stated age Head: Normocephalic, without obvious abnormality, atraumatic Eyes: PERRL, conjunctiva/corneas clear,  EOM's intact    Neck: Supple, symmetrical, trachea midline Lungs:  respirations unlabored Heart: Regular rate and rhythm Abdomen: Soft Extremities: Extremities normal, atraumatic, no cyanosis or edema    NEUROLOGIC:   Mental status: Awake and alert, answers simple yes/no questions, follows commands though she does have some difficulty at times or hesitation, poor repetition and poor naming and poor fluency Motor Exam - grossly normal, normal tone and bulk with may be some mild right sided weakness or slowness of movement Sensory Exam - grossly normal Reflexes: symmetric, no pathologic reflexes, No Hoffman's, No clonus Coordination - grossly normal Gait -not tested Balance -not tested Cranial Nerves: I: smell Not tested  II: visual acuity  OS: na    OD: na  II: visual fields   II: pupils Equal, round, reactive to light  III,VII: ptosis None  III,IV,VI: extraocular muscles  Full ROM  V: mastication Normal  V: facial light touch sensation  Normal  V,VII: corneal reflex  Present  VII: facial muscle function - upper  Normal  VII: facial muscle function - lower Normal  VIII: hearing Not tested  IX: soft palate elevation  Normal  IX,X: gag reflex Present  XI: trapezius strength  5/5  XI: sternocleidomastoid strength 5/5  XI: neck flexion strength  5/5  XII: tongue strength  Normal    Data Review Lab Results  Component Value Date   WBC 12.4 (H) 07/13/2018   HGB 14.6 07/13/2018   HCT 43.0 07/13/2018  MCV 92.3 07/13/2018   PLT 286 07/13/2018   Lab Results  Component Value Date   NA 141 07/13/2018   K 4.0 07/13/2018   CL 109 07/13/2018   CO2 22 07/13/2018   BUN 17 07/13/2018   CREATININE 0.70 07/13/2018   GLUCOSE 99 07/13/2018   Lab Results  Component Value Date   INR 1.1 07/13/2018    Radiology: Ct Angio Head W Or Wo Contrast  Result Date: 07/13/2018 CLINICAL DATA:  Initial evaluation for acute altered mental status. EXAM: CT HEAD WITHOUT CONTRAST CT ANGIOGRAPHY  HEAD AND NECK CT HEAD VENOGRAM TECHNIQUE: Multidetector CT imaging of the head and neck was performed using the standard protocol during bolus administration of intravenous contrast. Multiplanar CT image reconstructions and MIPs were obtained to evaluate the vascular anatomy. Carotid stenosis measurements (when applicable) are obtained utilizing NASCET criteria, using the distal internal carotid diameter as the denominator. CONTRAST:  58mL OMNIPAQUE IOHEXOL 350 MG/ML SOLN COMPARISON:  None available. FINDINGS: CT HEAD FINDINGS Brain: There is a large homogeneous Lea hyperdense lesion positioned at the left frontal lobe that measures 5.2 x 5.3 x 4.8 cm (transverse by AP by craniocaudad), felt to be most consistent with an underlying mass lesion, likely meningioma. Associated mild localized vasogenic edema. Regional mass effect with up to 7 mm of left-to-right shift. Left lateral ventricle is partially effaced. No hydrocephalus or ventricular trapping at this time. Basilar cisterns remain patent. No other mass lesion. No definite acute intracranial hemorrhage. No acute large vessel territory infarct. No extra-axial fluid collection. Vascular: No hyperdense vessel. Skull: Scalp soft tissues demonstrate no acute abnormality. Subtle permeated of change noted at the left frontal calvarium at the level of the left frontal mass (series 4, image 69). Few scattered prominent dural calcifications noted. Sinuses/Orbits: Globes and orbital soft tissues demonstrate no acute finding. Mild scattered mucosal thickening throughout the ethmoidal air cells and maxillary sinuses. Paranasal sinuses are otherwise clear. No mastoid effusion. Middle ear cavities clear. Other: None. ASPECTS ([place] Stroke Program Early CT Score) - Ganglionic level infarction (caudate, lentiform nuclei, internal capsule, insula, M1-M3 cortex): 7 - Supraganglionic infarction (M4-M6 cortex): 3 Total score (0-10 with 10 being normal): 10 CTA NECK FINDINGS  Aortic arch: Visualized aortic arch of normal caliber with normal branch pattern. No flow-limiting stenosis about the origin of the great vessels. Visualized subclavian arteries widely patent. Right carotid system: Right common and internal carotid arteries are widely patent without stenosis, dissection, or occlusion. No atheromatous narrowing about the right carotid bifurcation. Left carotid system: Left common and internal carotid arteries are widely patent without stenosis, dissection or occlusion. No atheromatous narrowing about the left carotid bifurcation. Vertebral arteries: Both vertebral arteries arise from the subclavian arteries. Vertebral arteries widely patent within the neck without stenosis, dissection or occlusion. Skeleton: No acute osseous abnormality. No discrete lytic or blastic osseous lesions. Other neck: No other acute soft tissue abnormality within the neck. Upper chest: Visualized upper chest demonstrates no acute finding. Review of the MIP images confirms the above findings CTA HEAD FINDINGS Anterior circulation: Internal carotid arteries widely patent to the termini without stenosis. A1 segments widely patent. Normal anterior communicating artery complex. Anterior cerebral arteries deviated to the right but widely patent to their distal aspects without stenosis. No M1 stenosis or occlusion. Normal MCA bifurcations. Distal MCA branches well perfused and symmetric. Posterior circulation: Both vertebral arteries widely patent to the vertebrobasilar junction. Left vertebral artery dominant. Posteroinferior cerebral arteries patent bilaterally. Basilar artery widely patent to its  distal aspect without stenosis. Superior cerebral arteries patent bilaterally. Right PCA supplied mainly via the basilar. Hypoplastic left P1 with prominent left posterior communicating artery. PCAs well perfused to their distal aspects. Venous sinuses: Major dural sinuses including the superior sagittal sinus,  transverse sinuses, and sigmoid sinuses appear patent as do the visualized internal jugular veins. Right transverse sinus dominant, with hypoplastic left transverse sinus. Straight sinus, vein of Galen, and internal cerebral veins patent. No evidence for dural sinus thrombosis. CT venogram confirms these findings. Anatomic variants: None significant.  No intracranial aneurysm. Delayed phase: Not performed. Left frontal mass lesion does demonstrate some intrinsic enhancement on delayed venogram images. Additionally, note made of a prominent feeder vessel extending from the overlying meninges on arterial phase images (series 7, image 38). No definite underlying AVM or other vascular abnormality. Review of the MIP images confirms the above findings IMPRESSION: CT HEAD IMPRESSION: 1. 5.2 x 5.3 x 4.8 cm homogeneous hyperdense mass centered at the left frontal lobe, most characteristic for a meningioma. Associated regional mass effect with up to 7 mm of left-to-right shift. No hydrocephalus or ventricular trapping. Further assessment with dedicated MRI, with and without contrast, recommended for further characterization. 2. Otherwise negative head CT. 3. ASPECTS = 10 CTA HEAD AND NECK IMPRESSION: 1. Negative CTA for emergent large vessel occlusion. No hemodynamically significant stenosis or other acute vascular abnormality identified. 2. Prominent and tortuous vessel within the central aspect of the left frontal lobe mass, most characteristic for an enlarged hypertrophied feeder vessel. No definite underlying AVM or other vascular abnormality. CT VENOGRAM IMPRESSION: Negative CT venogram. No evidence for dural sinus thrombosis or other abnormality. Results were discussed by telephone at the time of interpretation on 07/13/2018 at 10:30 pm with Dr. Samara Snide. Electronically Signed   By: Jeannine Boga M.D.   On: 07/13/2018 23:20   Ct Angio Neck W Or Wo Contrast  Result Date: 07/13/2018 CLINICAL DATA:  Initial  evaluation for acute altered mental status. EXAM: CT HEAD WITHOUT CONTRAST CT ANGIOGRAPHY HEAD AND NECK CT HEAD VENOGRAM TECHNIQUE: Multidetector CT imaging of the head and neck was performed using the standard protocol during bolus administration of intravenous contrast. Multiplanar CT image reconstructions and MIPs were obtained to evaluate the vascular anatomy. Carotid stenosis measurements (when applicable) are obtained utilizing NASCET criteria, using the distal internal carotid diameter as the denominator. CONTRAST:  58mL OMNIPAQUE IOHEXOL 350 MG/ML SOLN COMPARISON:  None available. FINDINGS: CT HEAD FINDINGS Brain: There is a large homogeneous Lea hyperdense lesion positioned at the left frontal lobe that measures 5.2 x 5.3 x 4.8 cm (transverse by AP by craniocaudad), felt to be most consistent with an underlying mass lesion, likely meningioma. Associated mild localized vasogenic edema. Regional mass effect with up to 7 mm of left-to-right shift. Left lateral ventricle is partially effaced. No hydrocephalus or ventricular trapping at this time. Basilar cisterns remain patent. No other mass lesion. No definite acute intracranial hemorrhage. No acute large vessel territory infarct. No extra-axial fluid collection. Vascular: No hyperdense vessel. Skull: Scalp soft tissues demonstrate no acute abnormality. Subtle permeated of change noted at the left frontal calvarium at the level of the left frontal mass (series 4, image 69). Few scattered prominent dural calcifications noted. Sinuses/Orbits: Globes and orbital soft tissues demonstrate no acute finding. Mild scattered mucosal thickening throughout the ethmoidal air cells and maxillary sinuses. Paranasal sinuses are otherwise clear. No mastoid effusion. Middle ear cavities clear. Other: None. ASPECTS [facility] Stroke Program Early CT Score) - Ganglionic  level infarction (caudate, lentiform nuclei, internal capsule, insula, M1-M3 cortex): 7 - Supraganglionic  infarction (M4-M6 cortex): 3 Total score (0-10 with 10 being normal): 10 CTA NECK FINDINGS Aortic arch: Visualized aortic arch of normal caliber with normal branch pattern. No flow-limiting stenosis about the origin of the great vessels. Visualized subclavian arteries widely patent. Right carotid system: Right common and internal carotid arteries are widely patent without stenosis, dissection, or occlusion. No atheromatous narrowing about the right carotid bifurcation. Left carotid system: Left common and internal carotid arteries are widely patent without stenosis, dissection or occlusion. No atheromatous narrowing about the left carotid bifurcation. Vertebral arteries: Both vertebral arteries arise from the subclavian arteries. Vertebral arteries widely patent within the neck without stenosis, dissection or occlusion. Skeleton: No acute osseous abnormality. No discrete lytic or blastic osseous lesions. Other neck: No other acute soft tissue abnormality within the neck. Upper chest: Visualized upper chest demonstrates no acute finding. Review of the MIP images confirms the above findings CTA HEAD FINDINGS Anterior circulation: Internal carotid arteries widely patent to the termini without stenosis. A1 segments widely patent. Normal anterior communicating artery complex. Anterior cerebral arteries deviated to the right but widely patent to their distal aspects without stenosis. No M1 stenosis or occlusion. Normal MCA bifurcations. Distal MCA branches well perfused and symmetric. Posterior circulation: Both vertebral arteries widely patent to the vertebrobasilar junction. Left vertebral artery dominant. Posteroinferior cerebral arteries patent bilaterally. Basilar artery widely patent to its distal aspect without stenosis. Superior cerebral arteries patent bilaterally. Right PCA supplied mainly via the basilar. Hypoplastic left P1 with prominent left posterior communicating artery. PCAs well perfused to their distal  aspects. Venous sinuses: Major dural sinuses including the superior sagittal sinus, transverse sinuses, and sigmoid sinuses appear patent as do the visualized internal jugular veins. Right transverse sinus dominant, with hypoplastic left transverse sinus. Straight sinus, vein of Galen, and internal cerebral veins patent. No evidence for dural sinus thrombosis. CT venogram confirms these findings. Anatomic variants: None significant.  No intracranial aneurysm. Delayed phase: Not performed. Left frontal mass lesion does demonstrate some intrinsic enhancement on delayed venogram images. Additionally, note made of a prominent feeder vessel extending from the overlying meninges on arterial phase images (series 7, image 38). No definite underlying AVM or other vascular abnormality. Review of the MIP images confirms the above findings IMPRESSION: CT HEAD IMPRESSION: 1. 5.2 x 5.3 x 4.8 cm homogeneous hyperdense mass centered at the left frontal lobe, most characteristic for a meningioma. Associated regional mass effect with up to 7 mm of left-to-right shift. No hydrocephalus or ventricular trapping. Further assessment with dedicated MRI, with and without contrast, recommended for further characterization. 2. Otherwise negative head CT. 3. ASPECTS = 10 CTA HEAD AND NECK IMPRESSION: 1. Negative CTA for emergent large vessel occlusion. No hemodynamically significant stenosis or other acute vascular abnormality identified. 2. Prominent and tortuous vessel within the central aspect of the left frontal lobe mass, most characteristic for an enlarged hypertrophied feeder vessel. No definite underlying AVM or other vascular abnormality. CT VENOGRAM IMPRESSION: Negative CT venogram. No evidence for dural sinus thrombosis or other abnormality. Results were discussed by telephone at the time of interpretation on 07/13/2018 at 10:30 pm with Dr. Samara Snide. Electronically Signed   By: Jeannine Boga M.D.   On: 07/13/2018 23:20    Mr Jeri Cos [employer] Contrast  Result Date: 07/14/2018 CLINICAL DATA:  Follow-up examination for acute stroke, mass on prior CT. EXAM: MRI HEAD WITHOUT AND WITH CONTRAST TECHNIQUE: Multiplanar, multiecho  pulse sequences of the brain and surrounding structures were obtained without and with intravenous contrast. CONTRAST:  9 cc of Gadavist. COMPARISON:  Prior CT and CTA from 07/13/2018. FINDINGS: Brain: Again seen is a large well-circumscribed mass centered at the left frontal convexity. Lesion measures 5.3 x 5.6 x 5.2 cm in maximal dimensions (AP by transverse by craniocaudad). Thin rim/cleft of CSF seen surrounding the lesion on T2 weighted sequence, consistent with an extra-axial mass. Lesion demonstrates hypointense precontrast T1 signal intensity with homogeneous solid postcontrast enhancement. Finding most consistent with a meningioma. Prominent flow void seen coursing from the overlying meninges to the central size deep aspect of the lesion, consistent with a large arterial feeder vessel (series 10, image 22). Overlying dural thickening and enhancement. Scattered areas of internal susceptibility artifact likely reflect blood products. Small amount of surrounding vasogenic edema within the adjacent frontal lobe. Regional mass effect with associated 7 mm of left-to-right shift. Left lateral ventricle partially compressed. No hydrocephalus or ventricular trapping. Basilar cisterns remain patent. Remainder the brain is normal in appearance. No evidence for acute or subacute infarct. Gray-white matter differentiation otherwise maintained. No areas of remote cortical infarction. No other evidence for acute or chronic intracranial hemorrhage. No other mass lesion or abnormal enhancement. Pituitary gland suprasellar region within normal limits. Midline structures intact. Vascular: Major intracranial vascular flow voids are maintained. Skull and upper cervical spine: Craniocervical junction within normal limits. Upper  cervical spine normal. Bone marrow signal intensity within normal limits. No scalp soft tissue abnormality. Few scattered dural calcifications again noted. Sinuses/Orbits: Globes and orbital soft tissues within normal limits. Paranasal sinuses are clear. No mastoid effusion. Inner ear structures grossly normal. Other: None. IMPRESSION: 1. 5.3 x 5.6 x 5.2 cm extra-axial mass overlying the left frontal convexity, most consistent with meningioma. Associated regional mass effect with up to 7 mm of left-to-right shift. 2. Otherwise normal brain MRI. Electronically Signed   By: Jeannine Boga M.D.   On: 07/14/2018 01:27   Ct Venogram Head  Result Date: 07/13/2018 CLINICAL DATA:  Initial evaluation for acute altered mental status. EXAM: CT HEAD WITHOUT CONTRAST CT ANGIOGRAPHY HEAD AND NECK CT HEAD VENOGRAM TECHNIQUE: Multidetector CT imaging of the head and neck was performed using the standard protocol during bolus administration of intravenous contrast. Multiplanar CT image reconstructions and MIPs were obtained to evaluate the vascular anatomy. Carotid stenosis measurements (when applicable) are obtained utilizing NASCET criteria, using the distal internal carotid diameter as the denominator. CONTRAST:  8mL OMNIPAQUE IOHEXOL 350 MG/ML SOLN COMPARISON:  None available. FINDINGS: CT HEAD FINDINGS Brain: There is a large homogeneous Lea hyperdense lesion positioned at the left frontal lobe that measures 5.2 x 5.3 x 4.8 cm (transverse by AP by craniocaudad), felt to be most consistent with an underlying mass lesion, likely meningioma. Associated mild localized vasogenic edema. Regional mass effect with up to 7 mm of left-to-right shift. Left lateral ventricle is partially effaced. No hydrocephalus or ventricular trapping at this time. Basilar cisterns remain patent. No other mass lesion. No definite acute intracranial hemorrhage. No acute large vessel territory infarct. No extra-axial fluid collection. Vascular:  No hyperdense vessel. Skull: Scalp soft tissues demonstrate no acute abnormality. Subtle permeated of change noted at the left frontal calvarium at the level of the left frontal mass (series 4, image 69). Few scattered prominent dural calcifications noted. Sinuses/Orbits: Globes and orbital soft tissues demonstrate no acute finding. Mild scattered mucosal thickening throughout the ethmoidal air cells and maxillary sinuses. Paranasal sinuses are otherwise  clear. No mastoid effusion. Middle ear cavities clear. Other: None. ASPECTS ([place] Stroke Program Early CT Score) - Ganglionic level infarction (caudate, lentiform nuclei, internal capsule, insula, M1-M3 cortex): 7 - Supraganglionic infarction (M4-M6 cortex): 3 Total score (0-10 with 10 being normal): 10 CTA NECK FINDINGS Aortic arch: Visualized aortic arch of normal caliber with normal branch pattern. No flow-limiting stenosis about the origin of the great vessels. Visualized subclavian arteries widely patent. Right carotid system: Right common and internal carotid arteries are widely patent without stenosis, dissection, or occlusion. No atheromatous narrowing about the right carotid bifurcation. Left carotid system: Left common and internal carotid arteries are widely patent without stenosis, dissection or occlusion. No atheromatous narrowing about the left carotid bifurcation. Vertebral arteries: Both vertebral arteries arise from the subclavian arteries. Vertebral arteries widely patent within the neck without stenosis, dissection or occlusion. Skeleton: No acute osseous abnormality. No discrete lytic or blastic osseous lesions. Other neck: No other acute soft tissue abnormality within the neck. Upper chest: Visualized upper chest demonstrates no acute finding. Review of the MIP images confirms the above findings CTA HEAD FINDINGS Anterior circulation: Internal carotid arteries widely patent to the termini without stenosis. A1 segments widely patent. Normal  anterior communicating artery complex. Anterior cerebral arteries deviated to the right but widely patent to their distal aspects without stenosis. No M1 stenosis or occlusion. Normal MCA bifurcations. Distal MCA branches well perfused and symmetric. Posterior circulation: Both vertebral arteries widely patent to the vertebrobasilar junction. Left vertebral artery dominant. Posteroinferior cerebral arteries patent bilaterally. Basilar artery widely patent to its distal aspect without stenosis. Superior cerebral arteries patent bilaterally. Right PCA supplied mainly via the basilar. Hypoplastic left P1 with prominent left posterior communicating artery. PCAs well perfused to their distal aspects. Venous sinuses: Major dural sinuses including the superior sagittal sinus, transverse sinuses, and sigmoid sinuses appear patent as do the visualized internal jugular veins. Right transverse sinus dominant, with hypoplastic left transverse sinus. Straight sinus, vein of Galen, and internal cerebral veins patent. No evidence for dural sinus thrombosis. CT venogram confirms these findings. Anatomic variants: None significant.  No intracranial aneurysm. Delayed phase: Not performed. Left frontal mass lesion does demonstrate some intrinsic enhancement on delayed venogram images. Additionally, note made of a prominent feeder vessel extending from the overlying meninges on arterial phase images (series 7, image 38). No definite underlying AVM or other vascular abnormality. Review of the MIP images confirms the above findings IMPRESSION: CT HEAD IMPRESSION: 1. 5.2 x 5.3 x 4.8 cm homogeneous hyperdense mass centered at the left frontal lobe, most characteristic for a meningioma. Associated regional mass effect with up to 7 mm of left-to-right shift. No hydrocephalus or ventricular trapping. Further assessment with dedicated MRI, with and without contrast, recommended for further characterization. 2. Otherwise negative head CT. 3.  ASPECTS = 10 CTA HEAD AND NECK IMPRESSION: 1. Negative CTA for emergent large vessel occlusion. No hemodynamically significant stenosis or other acute vascular abnormality identified. 2. Prominent and tortuous vessel within the central aspect of the left frontal lobe mass, most characteristic for an enlarged hypertrophied feeder vessel. No definite underlying AVM or other vascular abnormality. CT VENOGRAM IMPRESSION: Negative CT venogram. No evidence for dural sinus thrombosis or other abnormality. Results were discussed by telephone at the time of interpretation on 07/13/2018 at 10:30 pm with Dr. Samara Snide. Electronically Signed   By: Jeannine Boga M.D.   On: 07/13/2018 23:20   Ct Head Code Stroke Wo Contrast  Result Date: 07/13/2018 CLINICAL DATA:  Initial evaluation for acute altered mental status. EXAM: CT HEAD WITHOUT CONTRAST CT ANGIOGRAPHY HEAD AND NECK CT HEAD VENOGRAM TECHNIQUE: Multidetector CT imaging of the head and neck was performed using the standard protocol during bolus administration of intravenous contrast. Multiplanar CT image reconstructions and MIPs were obtained to evaluate the vascular anatomy. Carotid stenosis measurements (when applicable) are obtained utilizing NASCET criteria, using the distal internal carotid diameter as the denominator. CONTRAST:  93mL OMNIPAQUE IOHEXOL 350 MG/ML SOLN COMPARISON:  None available. FINDINGS: CT HEAD FINDINGS Brain: There is a large homogeneous Lea hyperdense lesion positioned at the left frontal lobe that measures 5.2 x 5.3 x 4.8 cm (transverse by AP by craniocaudad), felt to be most consistent with an underlying mass lesion, likely meningioma. Associated mild localized vasogenic edema. Regional mass effect with up to 7 mm of left-to-right shift. Left lateral ventricle is partially effaced. No hydrocephalus or ventricular trapping at this time. Basilar cisterns remain patent. No other mass lesion. No definite acute intracranial hemorrhage. No  acute large vessel territory infarct. No extra-axial fluid collection. Vascular: No hyperdense vessel. Skull: Scalp soft tissues demonstrate no acute abnormality. Subtle permeated of change noted at the left frontal calvarium at the level of the left frontal mass (series 4, image 69). Few scattered prominent dural calcifications noted. Sinuses/Orbits: Globes and orbital soft tissues demonstrate no acute finding. Mild scattered mucosal thickening throughout the ethmoidal air cells and maxillary sinuses. Paranasal sinuses are otherwise clear. No mastoid effusion. Middle ear cavities clear. Other: None. ASPECTS ([place] Stroke Program Early CT Score) - Ganglionic level infarction (caudate, lentiform nuclei, internal capsule, insula, M1-M3 cortex): 7 - Supraganglionic infarction (M4-M6 cortex): 3 Total score (0-10 with 10 being normal): 10 CTA NECK FINDINGS Aortic arch: Visualized aortic arch of normal caliber with normal branch pattern. No flow-limiting stenosis about the origin of the great vessels. Visualized subclavian arteries widely patent. Right carotid system: Right common and internal carotid arteries are widely patent without stenosis, dissection, or occlusion. No atheromatous narrowing about the right carotid bifurcation. Left carotid system: Left common and internal carotid arteries are widely patent without stenosis, dissection or occlusion. No atheromatous narrowing about the left carotid bifurcation. Vertebral arteries: Both vertebral arteries arise from the subclavian arteries. Vertebral arteries widely patent within the neck without stenosis, dissection or occlusion. Skeleton: No acute osseous abnormality. No discrete lytic or blastic osseous lesions. Other neck: No other acute soft tissue abnormality within the neck. Upper chest: Visualized upper chest demonstrates no acute finding. Review of the MIP images confirms the above findings CTA HEAD FINDINGS Anterior circulation: Internal carotid arteries  widely patent to the termini without stenosis. A1 segments widely patent. Normal anterior communicating artery complex. Anterior cerebral arteries deviated to the right but widely patent to their distal aspects without stenosis. No M1 stenosis or occlusion. Normal MCA bifurcations. Distal MCA branches well perfused and symmetric. Posterior circulation: Both vertebral arteries widely patent to the vertebrobasilar junction. Left vertebral artery dominant. Posteroinferior cerebral arteries patent bilaterally. Basilar artery widely patent to its distal aspect without stenosis. Superior cerebral arteries patent bilaterally. Right PCA supplied mainly via the basilar. Hypoplastic left P1 with prominent left posterior communicating artery. PCAs well perfused to their distal aspects. Venous sinuses: Major dural sinuses including the superior sagittal sinus, transverse sinuses, and sigmoid sinuses appear patent as do the visualized internal jugular veins. Right transverse sinus dominant, with hypoplastic left transverse sinus. Straight sinus, vein of Galen, and internal cerebral veins patent. No evidence for dural sinus thrombosis. CT  venogram confirms these findings. Anatomic variants: None significant.  No intracranial aneurysm. Delayed phase: Not performed. Left frontal mass lesion does demonstrate some intrinsic enhancement on delayed venogram images. Additionally, note made of a prominent feeder vessel extending from the overlying meninges on arterial phase images (series 7, image 38). No definite underlying AVM or other vascular abnormality. Review of the MIP images confirms the above findings IMPRESSION: CT HEAD IMPRESSION: 1. 5.2 x 5.3 x 4.8 cm homogeneous hyperdense mass centered at the left frontal lobe, most characteristic for a meningioma. Associated regional mass effect with up to 7 mm of left-to-right shift. No hydrocephalus or ventricular trapping. Further assessment with dedicated MRI, with and without  contrast, recommended for further characterization. 2. Otherwise negative head CT. 3. ASPECTS = 10 CTA HEAD AND NECK IMPRESSION: 1. Negative CTA for emergent large vessel occlusion. No hemodynamically significant stenosis or other acute vascular abnormality identified. 2. Prominent and tortuous vessel within the central aspect of the left frontal lobe mass, most characteristic for an enlarged hypertrophied feeder vessel. No definite underlying AVM or other vascular abnormality. CT VENOGRAM IMPRESSION: Negative CT venogram. No evidence for dural sinus thrombosis or other abnormality. Results were discussed by telephone at the time of interpretation on 07/13/2018 at 10:30 pm with Dr. Samara Snide. Electronically Signed   By: Jeannine Boga M.D.   On: 07/13/2018 23:20     Assessment/Plan: Estimated body mass index is 28.7 kg/m as calculated from the following:   Height as of 07/04/18: 5\' 10"  (1.778 m).   Weight as of 07/04/18: 60.28 kg.   38 year old female with a large left-sided meningioma with speech difficulties, likely secondary to cortical irritation.  Symptoms secondary to local mass-effect seem less likely since this lesion has been growing slowly for quite some time.  We can admit her and put her on IV Decadron and Keppra and obtain an MRI with BrainLab protocol.  If her symptoms clear she may be discharged to home and planned surgery as an outpatient.  If they do not clear we will consider earlier surgery such as the end of this week or early next week.  I spent considerable time explaining the situation to her husband and to the patient.  Her husband was able to view the images with me.  We have talked at length about the tumor itself and plans for resection.  We have talked about preoperative embolization.  I will discuss this with Dr. Kathyrn Sheriff.   Eustace Moore 07/14/2018 2:28 AM

## 2018-07-15 ENCOUNTER — Inpatient Hospital Stay (HOSPITAL_COMMUNITY): Payer: BC Managed Care – PPO

## 2018-07-15 ENCOUNTER — Inpatient Hospital Stay (HOSPITAL_COMMUNITY): Payer: BC Managed Care – PPO | Admitting: Certified Registered Nurse Anesthetist

## 2018-07-15 ENCOUNTER — Encounter (HOSPITAL_COMMUNITY): Payer: Self-pay | Admitting: Orthopedic Surgery

## 2018-07-15 ENCOUNTER — Encounter (HOSPITAL_COMMUNITY)
Admission: EM | Disposition: A | Discharge status: Home / Self Care | Payer: Self-pay | Source: Home / Self Care | Attending: Neurological Surgery

## 2018-07-15 DIAGNOSIS — Z86018 Personal history of other benign neoplasm: Secondary | ICD-10-CM

## 2018-07-15 DIAGNOSIS — G9389 Other specified disorders of brain: Secondary | ICD-10-CM

## 2018-07-15 DIAGNOSIS — Z9889 Other specified postprocedural states: Secondary | ICD-10-CM

## 2018-07-15 HISTORY — PX: RADIOLOGY WITH ANESTHESIA: SHX6223

## 2018-07-15 HISTORY — PX: IR NEURO EACH ADD'L AFTER BASIC UNI LEFT (MS): IMG5373

## 2018-07-15 HISTORY — PX: IR ANGIO EXTERNAL CAROTID SEL EXT CAROTID UNI L MOD SED: IMG5370

## 2018-07-15 HISTORY — PX: IR ANGIO INTRA EXTRACRAN SEL INTERNAL CAROTID UNI L MOD SED: IMG5361

## 2018-07-15 HISTORY — PX: IR ANGIOGRAM FOLLOW UP STUDY: IMG697

## 2018-07-15 HISTORY — PX: IR TRANSCATH/EMBOLIZ: IMG695

## 2018-07-15 LAB — SURGICAL PCR SCREEN
MRSA, PCR: NEGATIVE
Staphylococcus aureus: NEGATIVE

## 2018-07-15 SURGERY — IR WITH ANESTHESIA
Anesthesia: General

## 2018-07-15 MED ORDER — PROPOFOL 10 MG/ML IV BOLUS
INTRAVENOUS | Status: DC | PRN
  Administered 2018-07-15: 200 mg via INTRAVENOUS

## 2018-07-15 MED ORDER — DEXAMETHASONE SODIUM PHOSPHATE 10 MG/ML IJ SOLN
5.0000 mg | Freq: Four times a day (QID) | INTRAMUSCULAR | Status: DC
  Administered 2018-07-15 – 2018-07-18 (×11): 5 mg via INTRAVENOUS
  Filled 2018-07-15 (×11): qty 1

## 2018-07-15 MED ORDER — CEFAZOLIN SODIUM-DEXTROSE 2-3 GM-%(50ML) IV SOLR
INTRAVENOUS | Status: DC | PRN
  Administered 2018-07-15: 2 g via INTRAVENOUS

## 2018-07-15 MED ORDER — ROCURONIUM BROMIDE 10 MG/ML (PF) SYRINGE
PREFILLED_SYRINGE | INTRAVENOUS | Status: DC | PRN
  Administered 2018-07-15: 30 mg via INTRAVENOUS

## 2018-07-15 MED ORDER — FENTANYL CITRATE (PF) 100 MCG/2ML IJ SOLN: 25.0000 ug | INTRAMUSCULAR | Status: DC | PRN

## 2018-07-15 MED ORDER — HEPARIN SODIUM (PORCINE) 1000 UNIT/ML IJ SOLN
INTRAMUSCULAR | Status: DC | PRN
  Administered 2018-07-15: 1000 [IU] via INTRAVENOUS
  Administered 2018-07-15: 2000 [IU] via INTRAVENOUS

## 2018-07-15 MED ORDER — DEXAMETHASONE SODIUM PHOSPHATE 10 MG/ML IJ SOLN
INTRAMUSCULAR | Status: DC | PRN
  Administered 2018-07-15: 10 mg via INTRAVENOUS

## 2018-07-15 MED ORDER — LIDOCAINE HCL 1 % IJ SOLN
INTRAMUSCULAR | Status: AC
  Filled 2018-07-15: qty 20

## 2018-07-15 MED ORDER — LACTATED RINGERS IV SOLN
INTRAVENOUS | Status: DC
  Administered 2018-07-15 – 2018-07-18 (×2): via INTRAVENOUS

## 2018-07-15 MED ORDER — PROPOFOL 500 MG/50ML IV EMUL
INTRAVENOUS | Status: DC | PRN
  Administered 2018-07-15: 50 ug/kg/min via INTRAVENOUS

## 2018-07-15 MED ORDER — IOHEXOL 300 MG/ML  SOLN
100.0000 mL | Freq: Once | INTRAMUSCULAR | Status: AC | PRN
  Administered 2018-07-15: 60 mL via INTRA_ARTERIAL

## 2018-07-15 MED ORDER — ONDANSETRON HCL 4 MG/2ML IJ SOLN
INTRAMUSCULAR | Status: DC | PRN
  Administered 2018-07-15: 4 mg via INTRAVENOUS

## 2018-07-15 MED ORDER — PHENYLEPHRINE 40 MCG/ML (10ML) SYRINGE FOR IV PUSH (FOR BLOOD PRESSURE SUPPORT)
PREFILLED_SYRINGE | INTRAVENOUS | Status: DC | PRN
  Administered 2018-07-15: 80 ug via INTRAVENOUS

## 2018-07-15 MED ORDER — SODIUM CHLORIDE 0.9 % IV SOLN
INTRAVENOUS | Status: DC | PRN
  Administered 2018-07-15: 14:00:00 via INTRAVENOUS

## 2018-07-15 MED ORDER — FENTANYL CITRATE (PF) 250 MCG/5ML IJ SOLN
INTRAMUSCULAR | Status: DC | PRN
  Administered 2018-07-15 (×2): 50 ug via INTRAVENOUS

## 2018-07-15 MED ORDER — LIDOCAINE 2% (20 MG/ML) 5 ML SYRINGE
INTRAMUSCULAR | Status: DC | PRN
  Administered 2018-07-15: 60 mg via INTRAVENOUS

## 2018-07-15 MED ORDER — CEFAZOLIN SODIUM-DEXTROSE 2-4 GM/100ML-% IV SOLN
INTRAVENOUS | Status: AC
  Filled 2018-07-15: qty 100

## 2018-07-15 MED ORDER — SUGAMMADEX SODIUM 200 MG/2ML IV SOLN
INTRAVENOUS | Status: DC | PRN
  Administered 2018-07-15: 181.4 mg via INTRAVENOUS

## 2018-07-15 MED ORDER — LIDOCAINE HCL 1 % IJ SOLN
INTRAMUSCULAR | Status: AC | PRN
  Administered 2018-07-15: 10 mL

## 2018-07-15 MED ORDER — SUCCINYLCHOLINE CHLORIDE 200 MG/10ML IV SOSY
PREFILLED_SYRINGE | INTRAVENOUS | Status: DC | PRN
  Administered 2018-07-15: 120 mg via INTRAVENOUS

## 2018-07-15 MED ORDER — MIDAZOLAM HCL 2 MG/2ML IJ SOLN
INTRAMUSCULAR | Status: DC | PRN
  Administered 2018-07-15: 2 mg via INTRAVENOUS

## 2018-07-15 NOTE — Anesthesia Procedure Notes (Signed)
Procedure Name: MAC Date/Time: 07/15/2018 12:45 PM Performed by: Alain Marion, CRNA Pre-anesthesia Checklist: Patient identified, Emergency Drugs available, Suction available and Patient being monitored Patient Re-evaluated:Patient Re-evaluated prior to induction Oxygen Delivery Method: Nasal cannula Placement Confirmation: positive ETCO2

## 2018-07-15 NOTE — Sedation Documentation (Signed)
No antibiotics and no urinary catheter for this case, per Dr. Kathyrn Sheriff

## 2018-07-15 NOTE — Plan of Care (Signed)

## 2018-07-15 NOTE — Anesthesia Procedure Notes (Signed)
Procedure Name: Intubation Date/Time: 07/15/2018 1:37 PM Performed by: Alain Marion, CRNA Pre-anesthesia Checklist: Patient identified, Emergency Drugs available, Suction available and Patient being monitored Patient Re-evaluated:Patient Re-evaluated prior to induction Oxygen Delivery Method: Circle System Utilized Preoxygenation: Pre-oxygenation with 100% oxygen Induction Type: IV induction and Rapid sequence Laryngoscope Size: Miller and 2 Grade View: Grade I Tube type: Oral Tube size: 7.0 mm Number of attempts: 1 Airway Equipment and Method: Stylet and Oral airway Placement Confirmation: ETT inserted through vocal cords under direct vision,  positive ETCO2 and breath sounds checked- equal and bilateral Secured at: 22 cm Tube secured with: Tape Dental Injury: Teeth and Oropharynx as per pre-operative assessment

## 2018-07-15 NOTE — Progress Notes (Signed)
Patient gone to embolectomy. Speech continued to improve per nursing, no concerns for seizures.   Would continue keppra, please call with further questions or concerns.   Roland Rack, MD [facility] [phone]  If 7pm- 7am, please page neurology on call as listed in [place].

## 2018-07-15 NOTE — Anesthesia Procedure Notes (Signed)
Arterial Line Insertion Performed by: Nolon Nations, MD, anesthesiologist  Patient location: Pre-op. Preanesthetic checklist: patient identified, IV checked, site marked, risks and benefits discussed, surgical consent, monitors and equipment checked, pre-op evaluation, timeout performed and anesthesia consent Lidocaine 1% used for infiltration Left, radial was placed Catheter size: 20 Fr Hand hygiene performed  and maximum sterile barriers used   Attempts: 1 Procedure performed without using ultrasound guided technique. Following insertion, dressing applied and Biopatch. Post procedure assessment: normal and unchanged  Patient tolerated the procedure well with no immediate complications.

## 2018-07-15 NOTE — Progress Notes (Signed)
  NEUROSURGERY PROGRESS NOTE   No issues overnight. Pt has had significant improvement in speech.  EXAM:  BP 114/80 (BP Location: Right Arm)   Pulse 86   Temp 97.9 F (36.6 C) (Oral)   Resp 13   Ht 5\' 10"  (1.778 m)   Wt 90.7 kg   SpO2 97%   BMI 28.70 kg/m   Awake, alert, oriented  Speech largely fluent, appropriate  CN grossly intact  5/5 BUE/BLE   IMPRESSION:  38 y.o. female with large left frontal meningioma, clinically presenting with acute aphasia which has largely improved. Plan on surgical resection Mon.  PLAN: - Proceed with diagnostic angiogram, possible tumor embolization today - ICU postop  I again reviewed the indications for the procedure, the risks, and the expected postop course with the patient and her husband. All questions today were answered and consent was obtained.

## 2018-07-15 NOTE — Sedation Documentation (Signed)
Pt is under the care of Anesthesia

## 2018-07-15 NOTE — Transfer of Care (Signed)
Immediate Anesthesia Transfer of Care Note  Patient: April Peterson  Procedure(s) Performed: IR WITH ANESTHESIA ANGIOGRAM WITH POSSIBLE WITH POSSIBLE TUMOR EMBOLIZATION (N/A )  Patient Location: PACU  Anesthesia Type:General  Level of Consciousness: awake, alert  and oriented  Airway & Oxygen Therapy: Patient Spontanous Breathing and Patient connected to face mask oxygen  Post-op Assessment: Report given to RN and Post -op Vital signs reviewed and stable  Post vital signs: Reviewed and stable  Last Vitals:  Vitals Value Taken Time  BP 117/78 07/15/18 1515  Temp 36.6 C 07/15/18 1515  Pulse 56 07/15/18 1529  Resp 14 07/15/18 1529  SpO2 95 % 07/15/18 1529  Vitals shown include unvalidated device data.  Last Pain:  Vitals:   07/15/18 1515  TempSrc:   PainSc: 0-No pain         Complications: No apparent anesthesia complications

## 2018-07-15 NOTE — Sedation Documentation (Signed)
Pt brought to PACU bay 13.  Right groin and BLE pulses assessed with PACU RN.  Case ended

## 2018-07-15 NOTE — Anesthesia Preprocedure Evaluation (Addendum)
Anesthesia Evaluation  Patient identified by MRN, date of birth, ID band Patient awake    Reviewed: Allergy & Precautions, NPO status , Patient's Chart, lab work & pertinent test results  Airway Mallampati: I  TM Distance: >3 FB Neck ROM: Full    Dental no notable dental hx. (+) Teeth Intact, Dental Advisory Given   Pulmonary neg pulmonary ROS,    Pulmonary exam normal breath sounds clear to auscultation       Cardiovascular negative cardio ROS Normal cardiovascular exam Rhythm:Regular Rate:Normal     Neuro/Psych Left frontal meningioma negative psych ROS   GI/Hepatic negative GI ROS, Neg liver ROS,   Endo/Other  negative endocrine ROS  Renal/GU negative Renal ROS  negative genitourinary   Musculoskeletal negative musculoskeletal ROS (+)   Abdominal   Peds  Hematology negative hematology ROS (+)   Anesthesia Other Findings   Reproductive/Obstetrics 11 days post partum                            Anesthesia Physical Anesthesia Plan  ASA: II  Anesthesia Plan: General   Post-op Pain Management:    Induction: Intravenous  PONV Risk Score and Plan: 3 and Midazolam, Ondansetron and Dexamethasone  Airway Management Planned: Oral ETT  Additional Equipment: Arterial line  Intra-op Plan:   Post-operative Plan: Extubation in OR  Informed Consent: I have reviewed the patients History and Physical, chart, labs and discussed the procedure including the risks, benefits and alternatives for the proposed anesthesia with the patient or authorized representative who has indicated his/her understanding and acceptance.     Dental advisory given  Plan Discussed with: CRNA  Anesthesia Plan Comments:         Anesthesia Quick Evaluation

## 2018-07-16 NOTE — Progress Notes (Signed)
Subjective: Resting comfortably in bed, denies headache.  Objective: Vital signs in last 24 hours: Vitals:   07/16/18 0500 07/16/18 0600 07/16/18 0700 07/16/18 0800  BP: 104/64 109/71 118/75   Pulse: (!) 50 (!) 51 (!) 51   Resp: 11 12 14    Temp:    98.6 F (37 C)  TempSrc:    Oral  SpO2: 95% 96% 95%   Weight:      Height:        Intake/Output from previous day: 07/10 0701 - 07/11 0700 In: 2590.2 [P.O.:240; I.V.:2250.2; IV Piggyback:100] Out: 2550 [Urine:2500; Blood:50] Intake/Output this shift: No intake/output data recorded.  Physical Exam: Awake and alert, oriented to name, hospital, and July 2020.  Following commands.  Speech fluent.  Good naming and repetition.  Moving all 4 extremities well.  CBC Recent Labs    07/13/18 2146 07/13/18 2154  WBC 12.4*  --   HGB 14.0 14.6  HCT 42.9 43.0  PLT 286  --    BMET Recent Labs    07/13/18 2146 07/13/18 2154  NA 140 141  K 4.2 4.0  CL 109 109  CO2 22  --   GLUCOSE 100* 99  BUN 17 17  CREATININE 0.73 0.70  CALCIUM 9.4  --     Studies/Results: Mr Jeri Cos Wo Contrast  Result Date: 07/14/2018 CLINICAL DATA:  Preoperative planning for left-sided meningioma. Brain lab study. Ten days postpartum. EXAM: MRI HEAD WITHOUT AND WITH CONTRAST TECHNIQUE: Multiplanar, multiecho pulse sequences of the brain and surrounding structures were obtained without and with intravenous contrast. CONTRAST:  9 cc Gadavist COMPARISON:  Earlier same day FINDINGS: Brain: BrainLAB protocol performed for operative guidance. The examination is obviously stable compared earlier today. 5.3 x 5.6 x 5.2 cm meningioma at the left frontal vertex indenting the brain. Left-to-right midline brain shift of 7 mm. Mild adjacent vasogenic edema. See earlier study for full report. IMPRESSION: [place]urel study for operative guidance. No change since earlier today. Electronically Signed   By: Nelson Chimes M.D.   On: 07/14/2018 17:36    Assessment/Plan: For surgery on  July 13.  Continue current supportive care.   Hosie Spangle, MD 07/16/2018, 8:43 AM

## 2018-07-16 NOTE — Progress Notes (Signed)
Patient ID: April Peterson, female   DOB: June 14, 1980, 38 y.o.   MRN: [id number] Doing great, no headache, no NTW, no drift, good naming and repetition, still some issues with fluency at times. OR Monday

## 2018-07-17 MED ORDER — CHLORHEXIDINE GLUCONATE CLOTH 2 % EX PADS: 6.0000 | MEDICATED_PAD | Freq: Once | CUTANEOUS | Status: AC

## 2018-07-17 MED ORDER — CHLORHEXIDINE GLUCONATE CLOTH 2 % EX PADS: 6.0000 | MEDICATED_PAD | Freq: Once | CUTANEOUS | Status: DC

## 2018-07-17 NOTE — Brief Op Note (Signed)
  NEUROSURGERY BRIEF OPERATIVE  NOTE   PREOP DX: Left frontal tumor  POSTOP DX: Same  PROCEDURE: Diagnostic cerebral angiogram, Onyx embolization of left frontal tumor  SURGEON: Dr. Consuella Lose, MD  ANESTHESIA: IV Sedation with Local followed by GETA  EBL: Minimal  SPECIMENS: None  COMPLICATIONS: None  CONDITION: Stable to recovery  FINDINGS (Full report in [facility]): 1. Large left frontal tumor supplied primarily by large left middle meningeal artery and to a lesser extent left ACA and MCA 2. Successful onyx embolization of left MMA pedicle with significant reduction in tumor blush post embolization

## 2018-07-17 NOTE — Progress Notes (Signed)
Patient ID: April Peterson, female   DOB: 1980/12/26, 38 y.o.   MRN: [id number] I have had a long discussion with the patient and her husband.  I have described the surgery tomorrow.  Have tried to answer their questions.  We talked about typical outcomes and recovery times.  We talked about risks and benefits.  They understand the risks of the surgery include but are not limited to bleeding, infection, stroke, sinus injury, venous injury, venous infarction, numbness, weakness, loss of vision, loss of speech, lack of relief of symptoms, worsening symptoms, need for further surgery, and anesthesia risk including DVT pneumonia MI and death.  They agree to proceed.

## 2018-07-17 NOTE — Progress Notes (Signed)
Subjective: Patient resting comfortably in bed, for OR tomorrow.  Objective: Vital signs in last 24 hours: Vitals:   07/17/18 0200 07/17/18 0300 07/17/18 0400 07/17/18 0500  BP: 102/72 106/72 106/71 107/61  Pulse: (!) 47 (!) 47 (!) 45 (!) 46  Resp: 12 14 11 12   Temp:  98.6 F (37 C)    TempSrc:  Oral    SpO2: 96% 96% 96% 96%  Weight:      Height:        Intake/Output from previous day: 07/11 0701 - 07/12 0700 In: 1948.8 [P.O.:240; I.V.:1608.8; IV Piggyback:100] Out: -  Intake/Output this shift: Total I/O In: 7989 [P.O.:240; I.V.:954; IV Piggyback:100] Out: -   Physical Exam: Awake and alert, oriented.  Following commands.  Moving all 4 extremities well.  Assessment/Plan: Preop orders written.  Hosie Spangle, MD 07/17/2018, 6:17 AM

## 2018-07-18 ENCOUNTER — Inpatient Hospital Stay (HOSPITAL_COMMUNITY): Payer: BC Managed Care – PPO | Admitting: Anesthesiology

## 2018-07-18 ENCOUNTER — Encounter (HOSPITAL_COMMUNITY): Payer: Self-pay | Admitting: Neurosurgery

## 2018-07-18 ENCOUNTER — Other Ambulatory Visit: Payer: Self-pay | Admitting: Radiation Therapy

## 2018-07-18 ENCOUNTER — Encounter (HOSPITAL_COMMUNITY)
Admission: EM | Disposition: A | Discharge status: Home / Self Care | Payer: Self-pay | Source: Home / Self Care | Attending: Neurological Surgery

## 2018-07-18 DIAGNOSIS — Z9889 Other specified postprocedural states: Secondary | ICD-10-CM

## 2018-07-18 DIAGNOSIS — R569 Unspecified convulsions: Secondary | ICD-10-CM

## 2018-07-18 HISTORY — DX: Unspecified convulsions: R56.9

## 2018-07-18 HISTORY — PX: CRANIOTOMY: SHX93

## 2018-07-18 HISTORY — PX: APPLICATION OF CRANIAL NAVIGATION: SHX6578

## 2018-07-18 LAB — TYPE AND SCREEN
ABO/RH(D): A POS
Antibody Screen: NEGATIVE

## 2018-07-18 SURGERY — CRANIOTOMY TUMOR EXCISION
Anesthesia: General | Site: Head | Laterality: Left

## 2018-07-18 MED ORDER — OXYCODONE HCL 5 MG/5ML PO SOLN: 5.0000 mg | Freq: Once | ORAL | Status: DC | PRN

## 2018-07-18 MED ORDER — LEVETIRACETAM IN NACL 500 MG/100ML IV SOLN
500.0000 mg | Freq: Two times a day (BID) | INTRAVENOUS | Status: DC
  Administered 2018-07-18: 500 mg via INTRAVENOUS
  Filled 2018-07-18 (×2): qty 100

## 2018-07-18 MED ORDER — PHENYLEPHRINE 40 MCG/ML (10ML) SYRINGE FOR IV PUSH (FOR BLOOD PRESSURE SUPPORT)
PREFILLED_SYRINGE | INTRAVENOUS | Status: AC
  Filled 2018-07-18: qty 20

## 2018-07-18 MED ORDER — ACETAMINOPHEN 160 MG/5ML PO SOLN: 1000.0000 mg | Freq: Once | ORAL | Status: DC | PRN

## 2018-07-18 MED ORDER — MIDAZOLAM HCL 5 MG/5ML IJ SOLN
INTRAMUSCULAR | Status: DC | PRN
  Administered 2018-07-18: 2 mg via INTRAVENOUS

## 2018-07-18 MED ORDER — CHLORHEXIDINE GLUCONATE CLOTH 2 % EX PADS: 6.0000 | MEDICATED_PAD | Freq: Every day | CUTANEOUS | Status: DC

## 2018-07-18 MED ORDER — LIDOCAINE-EPINEPHRINE 1 %-1:100000 IJ SOLN
INTRAMUSCULAR | Status: AC
  Filled 2018-07-18: qty 1

## 2018-07-18 MED ORDER — PROPOFOL 10 MG/ML IV BOLUS
INTRAVENOUS | Status: AC
  Filled 2018-07-18: qty 20

## 2018-07-18 MED ORDER — CEFAZOLIN SODIUM-DEXTROSE 1-4 GM/50ML-% IV SOLN
1.0000 g | Freq: Three times a day (TID) | INTRAVENOUS | Status: AC
  Administered 2018-07-18 – 2018-07-19 (×2): 1 g via INTRAVENOUS
  Filled 2018-07-18 (×2): qty 50

## 2018-07-18 MED ORDER — PROMETHAZINE HCL 25 MG PO TABS: 12.5000 mg | ORAL_TABLET | ORAL | Status: DC | PRN

## 2018-07-18 MED ORDER — ONDANSETRON HCL 4 MG PO TABS: 4.0000 mg | ORAL_TABLET | ORAL | Status: DC | PRN

## 2018-07-18 MED ORDER — DEXAMETHASONE SODIUM PHOSPHATE 4 MG/ML IJ SOLN
4.0000 mg | Freq: Four times a day (QID) | INTRAMUSCULAR | Status: DC
  Administered 2018-07-19 – 2018-07-20 (×3): 4 mg via INTRAVENOUS
  Filled 2018-07-18 (×4): qty 1

## 2018-07-18 MED ORDER — CEFAZOLIN SODIUM-DEXTROSE 2-4 GM/100ML-% IV SOLN
2.0000 g | INTRAVENOUS | Status: AC
  Administered 2018-07-18: 17:00:00 2 g via INTRAVENOUS

## 2018-07-18 MED ORDER — ONDANSETRON HCL 4 MG/2ML IJ SOLN
INTRAMUSCULAR | Status: DC | PRN
  Administered 2018-07-18: 4 mg via INTRAVENOUS

## 2018-07-18 MED ORDER — ONDANSETRON HCL 4 MG/2ML IJ SOLN: 4.0000 mg | INTRAMUSCULAR | Status: DC | PRN

## 2018-07-18 MED ORDER — DEXAMETHASONE SODIUM PHOSPHATE 10 MG/ML IJ SOLN
INTRAMUSCULAR | Status: AC
  Filled 2018-07-18: qty 1

## 2018-07-18 MED ORDER — PROPOFOL 10 MG/ML IV BOLUS
INTRAVENOUS | Status: DC | PRN
  Administered 2018-07-18: 140 mg via INTRAVENOUS

## 2018-07-18 MED ORDER — POTASSIUM CHLORIDE IN NACL 20-0.9 MEQ/L-% IV SOLN
INTRAVENOUS | Status: DC
  Administered 2018-07-18 – 2018-07-19 (×2): via INTRAVENOUS
  Filled 2018-07-18: qty 1000

## 2018-07-18 MED ORDER — MORPHINE SULFATE (PF) 2 MG/ML IV SOLN: 1.0000 mg | INTRAVENOUS | Status: DC | PRN

## 2018-07-18 MED ORDER — HEMOSTATIC AGENTS (NO CHARGE) OPTIME
TOPICAL | Status: DC | PRN
  Administered 2018-07-18: 1 via TOPICAL

## 2018-07-18 MED ORDER — BACITRACIN ZINC 500 UNIT/GM EX OINT
TOPICAL_OINTMENT | CUTANEOUS | Status: AC
  Filled 2018-07-18: qty 28.35

## 2018-07-18 MED ORDER — DEXAMETHASONE SODIUM PHOSPHATE 4 MG/ML IJ SOLN: 4.0000 mg | Freq: Three times a day (TID) | INTRAMUSCULAR | Status: DC

## 2018-07-18 MED ORDER — SODIUM CHLORIDE 0.9 % IV SOLN
INTRAVENOUS | Status: DC | PRN
  Administered 2018-07-18: 17:00:00 10 ug/min via INTRAVENOUS

## 2018-07-18 MED ORDER — FENTANYL CITRATE (PF) 100 MCG/2ML IJ SOLN
25.0000 ug | INTRAMUSCULAR | Status: DC | PRN
  Administered 2018-07-18: 50 ug via INTRAVENOUS

## 2018-07-18 MED ORDER — LIDOCAINE HCL (CARDIAC) PF 100 MG/5ML IV SOSY
PREFILLED_SYRINGE | INTRAVENOUS | Status: DC | PRN
  Administered 2018-07-18: 60 mg via INTRAVENOUS

## 2018-07-18 MED ORDER — THROMBIN 5000 UNITS EX SOLR
CUTANEOUS | Status: AC
  Filled 2018-07-18: qty 10000

## 2018-07-18 MED ORDER — SODIUM CHLORIDE 0.9 % IV SOLN
INTRAVENOUS | Status: DC | PRN
  Administered 2018-07-18: 17:00:00 via INTRAVENOUS

## 2018-07-18 MED ORDER — SODIUM CHLORIDE 0.9 % IV SOLN
INTRAVENOUS | Status: DC | PRN
  Administered 2018-07-18: 500 mL

## 2018-07-18 MED ORDER — LIDOCAINE 2% (20 MG/ML) 5 ML SYRINGE
INTRAMUSCULAR | Status: AC
  Filled 2018-07-18: qty 5

## 2018-07-18 MED ORDER — CEFAZOLIN SODIUM-DEXTROSE 2-4 GM/100ML-% IV SOLN
INTRAVENOUS | Status: AC
  Filled 2018-07-18: qty 100

## 2018-07-18 MED ORDER — SODIUM CHLORIDE 0.9 % IV SOLN
INTRAVENOUS | Status: DC
  Administered 2018-07-18 (×2): via INTRAVENOUS

## 2018-07-18 MED ORDER — ACETAMINOPHEN 325 MG PO TABS
650.0000 mg | ORAL_TABLET | ORAL | Status: DC | PRN
  Administered 2018-07-19 – 2018-07-20 (×4): 650 mg via ORAL
  Filled 2018-07-18 (×4): qty 2

## 2018-07-18 MED ORDER — ROCURONIUM BROMIDE 100 MG/10ML IV SOLN
INTRAVENOUS | Status: DC | PRN
  Administered 2018-07-18: 50 mg via INTRAVENOUS
  Administered 2018-07-18: 40 mg via INTRAVENOUS
  Administered 2018-07-18: 60 mg via INTRAVENOUS

## 2018-07-18 MED ORDER — DEXAMETHASONE SODIUM PHOSPHATE 10 MG/ML IJ SOLN
6.0000 mg | Freq: Four times a day (QID) | INTRAMUSCULAR | Status: AC
  Administered 2018-07-18 – 2018-07-19 (×4): 6 mg via INTRAVENOUS
  Filled 2018-07-18 (×4): qty 1

## 2018-07-18 MED ORDER — ACETAMINOPHEN 500 MG PO TABS: 1000.0000 mg | ORAL_TABLET | Freq: Once | ORAL | Status: DC | PRN

## 2018-07-18 MED ORDER — MIDAZOLAM HCL 2 MG/2ML IJ SOLN
INTRAMUSCULAR | Status: AC
  Filled 2018-07-18: qty 2

## 2018-07-18 MED ORDER — ACETAMINOPHEN 650 MG RE SUPP: 650.0000 mg | RECTAL | Status: DC | PRN

## 2018-07-18 MED ORDER — THROMBIN 5000 UNITS EX SOLR
OROMUCOSAL | Status: DC | PRN
  Administered 2018-07-18: 5 mL via TOPICAL

## 2018-07-18 MED ORDER — SUGAMMADEX SODIUM 200 MG/2ML IV SOLN
INTRAVENOUS | Status: DC | PRN
  Administered 2018-07-18: 200 mg via INTRAVENOUS

## 2018-07-18 MED ORDER — THROMBIN 20000 UNITS EX SOLR
CUTANEOUS | Status: DC | PRN
  Administered 2018-07-18: 20 mL via TOPICAL

## 2018-07-18 MED ORDER — SENNA 8.6 MG PO TABS
1.0000 | ORAL_TABLET | Freq: Two times a day (BID) | ORAL | Status: DC
  Administered 2018-07-19 – 2018-07-20 (×2): 8.6 mg via ORAL
  Filled 2018-07-18 (×3): qty 1

## 2018-07-18 MED ORDER — FENTANYL CITRATE (PF) 250 MCG/5ML IJ SOLN
INTRAMUSCULAR | Status: AC
  Filled 2018-07-18: qty 5

## 2018-07-18 MED ORDER — ACETAMINOPHEN 10 MG/ML IV SOLN: 1000.0000 mg | Freq: Once | INTRAVENOUS | Status: DC | PRN

## 2018-07-18 MED ORDER — ONDANSETRON HCL 4 MG/2ML IJ SOLN
INTRAMUSCULAR | Status: AC
  Filled 2018-07-18: qty 2

## 2018-07-18 MED ORDER — LIDOCAINE-EPINEPHRINE 1 %-1:100000 IJ SOLN
INTRAMUSCULAR | Status: DC | PRN
  Administered 2018-07-18: 10 mL

## 2018-07-18 MED ORDER — HYDROCODONE-ACETAMINOPHEN 5-325 MG PO TABS
1.0000 | ORAL_TABLET | ORAL | Status: DC | PRN
  Administered 2018-07-18 – 2018-07-19 (×3): 1 via ORAL
  Filled 2018-07-18 (×3): qty 1

## 2018-07-18 MED ORDER — FENTANYL CITRATE (PF) 100 MCG/2ML IJ SOLN
INTRAMUSCULAR | Status: AC
  Filled 2018-07-18: qty 2

## 2018-07-18 MED ORDER — FENTANYL CITRATE (PF) 100 MCG/2ML IJ SOLN
INTRAMUSCULAR | Status: DC | PRN
  Administered 2018-07-18: 100 ug via INTRAVENOUS
  Administered 2018-07-18: 50 ug via INTRAVENOUS
  Administered 2018-07-18: 100 ug via INTRAVENOUS

## 2018-07-18 MED ORDER — 0.9 % SODIUM CHLORIDE (POUR BTL) OPTIME
TOPICAL | Status: DC | PRN
  Administered 2018-07-18 (×2): 1000 mL

## 2018-07-18 MED ORDER — SUCCINYLCHOLINE CHLORIDE 20 MG/ML IJ SOLN
INTRAMUSCULAR | Status: DC | PRN
  Administered 2018-07-18: 80 mg via INTRAVENOUS

## 2018-07-18 MED ORDER — THROMBIN 20000 UNITS EX KIT
PACK | CUTANEOUS | Status: AC
  Filled 2018-07-18: qty 1

## 2018-07-18 MED ORDER — BACITRACIN ZINC 500 UNIT/GM EX OINT
TOPICAL_OINTMENT | CUTANEOUS | Status: DC | PRN
  Administered 2018-07-18: 1 via TOPICAL

## 2018-07-18 MED ORDER — PANTOPRAZOLE SODIUM 40 MG IV SOLR
40.0000 mg | Freq: Every day | INTRAVENOUS | Status: DC
  Administered 2018-07-18: 40 mg via INTRAVENOUS
  Filled 2018-07-18: qty 40

## 2018-07-18 MED ORDER — LABETALOL HCL 5 MG/ML IV SOLN: 10.0000 mg | INTRAVENOUS | Status: DC | PRN

## 2018-07-18 MED ORDER — OXYCODONE HCL 5 MG PO TABS: 5.0000 mg | ORAL_TABLET | Freq: Once | ORAL | Status: DC | PRN

## 2018-07-18 MED ORDER — DEXAMETHASONE SODIUM PHOSPHATE 10 MG/ML IJ SOLN
10.0000 mg | Freq: Once | INTRAMUSCULAR | Status: AC
  Administered 2018-07-18: 10 mg via INTRAVENOUS

## 2018-07-18 SURGICAL SUPPLY — 73 items
BAG DECANTER FOR FLEXI CONT (MISCELLANEOUS) ×2 IMPLANT
BLADE CLIPPER SPEC (BLADE) ×2 IMPLANT
BUR SPIRAL ROUTER 2.3 (BUR) ×1 IMPLANT
CANISTER SUCT 3000ML PPV (MISCELLANEOUS) ×2 IMPLANT
CARTRIDGE OIL MAESTRO DRILL (MISCELLANEOUS) ×1 IMPLANT
CLIP VESOCCLUDE MED 6/CT (CLIP) IMPLANT
CONT SPEC 4OZ CLIKSEAL STRL BL (MISCELLANEOUS) ×2 IMPLANT
COVER BACK TABLE 60X90IN (DRAPES) IMPLANT
COVER WAND RF STERILE (DRAPES) ×1 IMPLANT
DIFFUSER DRILL AIR PNEUMATIC (MISCELLANEOUS) ×2 IMPLANT
DRAPE MICROSCOPE LEICA (MISCELLANEOUS) ×1 IMPLANT
DRAPE NEUROLOGICAL W/INCISE (DRAPES) ×2 IMPLANT
DRAPE SURG 17X23 STRL (DRAPES) IMPLANT
DRAPE WARM FLUID 44X44 (DRAPES) ×2 IMPLANT
DURAPREP 6ML APPLICATOR 50/CS (WOUND CARE) ×3 IMPLANT
ELECT REM PT RETURN 9FT ADLT (ELECTROSURGICAL) ×2
ELECTRODE REM PT RTRN 9FT ADLT (ELECTROSURGICAL) ×1 IMPLANT
EVACUATOR 1/8 PVC DRAIN (DRAIN) IMPLANT
FORCEPS BIPOLAR SPETZLER 8 1.0 (NEUROSURGERY SUPPLIES) ×1 IMPLANT
GAUZE 4X4 16PLY RFD (DISPOSABLE) IMPLANT
GAUZE SPONGE 4X4 12PLY STRL (GAUZE/BANDAGES/DRESSINGS) ×1 IMPLANT
GLOVE BIO SURGEON STRL SZ 6.5 (GLOVE) ×3 IMPLANT
GLOVE BIO SURGEON STRL SZ7 (GLOVE) ×2 IMPLANT
GLOVE BIO SURGEON STRL SZ8 (GLOVE) ×3 IMPLANT
GLOVE BIOGEL PI IND STRL 6.5 (GLOVE) IMPLANT
GLOVE BIOGEL PI IND STRL 7.0 (GLOVE) IMPLANT
GLOVE BIOGEL PI IND STRL 7.5 (GLOVE) IMPLANT
GLOVE BIOGEL PI INDICATOR 6.5 (GLOVE) ×1
GLOVE BIOGEL PI INDICATOR 7.0 (GLOVE) ×1
GLOVE BIOGEL PI INDICATOR 7.5 (GLOVE) ×1
GOWN STRL REUS W/ TWL LRG LVL3 (GOWN DISPOSABLE) IMPLANT
GOWN STRL REUS W/ TWL XL LVL3 (GOWN DISPOSABLE) IMPLANT
GOWN STRL REUS W/TWL 2XL LVL3 (GOWN DISPOSABLE) ×1 IMPLANT
GOWN STRL REUS W/TWL LRG LVL3 (GOWN DISPOSABLE) ×6
GOWN STRL REUS W/TWL XL LVL3 (GOWN DISPOSABLE) ×2
GRAFT DURAGEN MATRIX 3WX3L (Graft) ×2 IMPLANT
GRAFT DURAGEN MATRIX 3X3 SNGL (Graft) IMPLANT
HEMOSTAT POWDER KIT SURGIFOAM (HEMOSTASIS) ×1 IMPLANT
HEMOSTAT SURGICEL 2X14 (HEMOSTASIS) ×1 IMPLANT
KIT BASIN OR (CUSTOM PROCEDURE TRAY) ×2 IMPLANT
KIT TURNOVER KIT B (KITS) ×2 IMPLANT
MARKER SPHERE PSV REFLC 13MM (MARKER) ×4 IMPLANT
NEEDLE HYPO 22GX1.5 SAFETY (NEEDLE) ×2 IMPLANT
NS IRRIG 1000ML POUR BTL (IV SOLUTION) ×3 IMPLANT
OIL CARTRIDGE MAESTRO DRILL (MISCELLANEOUS) ×2
PACK CRANIOTOMY CUSTOM (CUSTOM PROCEDURE TRAY) ×2 IMPLANT
PAD ARMBOARD 7.5X6 YLW CONV (MISCELLANEOUS) ×4 IMPLANT
PATTIES SURGICAL .25X.25 (GAUZE/BANDAGES/DRESSINGS) IMPLANT
PATTIES SURGICAL .5 X.5 (GAUZE/BANDAGES/DRESSINGS) IMPLANT
PATTIES SURGICAL .5 X3 (DISPOSABLE) ×2 IMPLANT
PATTIES SURGICAL 1X1 (DISPOSABLE) IMPLANT
PERFORATOR LRG  14-11MM (BIT) ×1
PERFORATOR LRG 14-11MM (BIT) ×1 IMPLANT
PIN MAYFIELD SKULL DISP (PIN) ×1 IMPLANT
PLATE 1.5  2HOLE LNG NEURO (Plate) ×2 IMPLANT
PLATE 1.5 2HOLE LNG NEURO (Plate) IMPLANT
PLATE 1.5/0.5 18.5MM BURR HOLE (Plate) ×2 IMPLANT
RUBBERBAND STERILE (MISCELLANEOUS) ×2 IMPLANT
SCREW SELF DRILL HT 1.5/4MM (Screw) ×12 IMPLANT
SPECIMEN JAR SMALL (MISCELLANEOUS) IMPLANT
SPONGE NEURO XRAY DETECT 1X3 (DISPOSABLE) IMPLANT
SPONGE SURGIFOAM ABS GEL 100 (HEMOSTASIS) ×2 IMPLANT
STAPLER VISISTAT 35W (STAPLE) ×1 IMPLANT
SUT ETHILON 3 0 FSL (SUTURE) IMPLANT
SUT NURALON 4 0 TR CR/8 (SUTURE) ×4 IMPLANT
SUT VIC AB 2-0 CP2 18 (SUTURE) ×3 IMPLANT
SYR CONTROL 10ML LL (SYRINGE) ×1 IMPLANT
TAPE CLOTH SURG 4X10 WHT LF (GAUZE/BANDAGES/DRESSINGS) ×1 IMPLANT
TOWEL GREEN STERILE (TOWEL DISPOSABLE) ×2 IMPLANT
TOWEL GREEN STERILE FF (TOWEL DISPOSABLE) ×2 IMPLANT
TRAY FOLEY MTR SLVR 16FR STAT (SET/KITS/TRAYS/PACK) IMPLANT
UNDERPAD 30X30 (UNDERPADS AND DIAPERS) IMPLANT
WATER STERILE IRR 1000ML POUR (IV SOLUTION) ×2 IMPLANT

## 2018-07-18 NOTE — Op Note (Signed)
07/18/2018  7:35 PM  PATIENT:  April Peterson  38 y.o. female  PRE-OPERATIVE DIAGNOSIS: Large left frontal meningioma  POST-OPERATIVE DIAGNOSIS:  same  PROCEDURE: Left frontal craniotomy for resection of left frontal meningioma utilizing frameless stereotactic image guidance  SURGEON:  Sherley Bounds, MD  ASSISTANTS: Glenford Peers, FNP  ANESTHESIA:   General  EBL: 50 ml  Total I/O In: 2000 [I.V.:2000] Out: 350 [Urine:350]  BLOOD ADMINISTERED: none  DRAINS: None  SPECIMEN:  none  INDICATION FOR PROCEDURE: This patient presented with difficulty with speech. Imaging showed large left meningioma.  Recommended craniotomy for resection of the lesion. Patient understood the risks, benefits, and alternatives and potential outcomes and wished to proceed.  PROCEDURE DETAILS: The patient was taken to the operating room and after induction of adequate generalized endotracheal anesthesia, the head was affixed in a 3 point Mayfield head rest, and turned to the right to expose the left frontotemporal parietal region.  We registered the [place] stereotactic image guidance.  We checked for accuracy.  We then used this to plan our incision.  The head was shaved and then cleaned and then prepped with DuraPrep and draped in the usual sterile fashion. 10 cc of local anesthetic was injected, and a curvilinear incision was made on the left of the head. Raney clips were placed to establish hemostasis of the scalp, the muscle was reflected with the scalp flap, to expose the the left frontotemporal region. A burr hole was placed just lateral to the superior sagittal sinus and in the superior temporal line, and a craniotomy flap was turned utilizing the high-speed, air powered drill. The flap was then placed in bacitracin-containing saline solution, and the dura was opened to expose a large left-sided meningioma.  We cut the dura circumferentially around the tumor so that the dura would also be  resected.  We worked in the plane between the tumor and the brain.  We used cottonoids to pack as we went along.  We continued to dissect circumferentially around the tumor.  We put traction on the tumor to help the dissection between the brain and the tumor bed.  We continue to work circumferentially around the tumor into it was removed en bloc.  We checked for any residual tumor.  There was a slight bit just lateral to the sinus and this was removed.  Once the tumor was removed I dried the surgical bed with bipolar cautery and  Surgifoam and cottonoids, and then lined the surgical bed with Surgicel.  We felt the resection bed and the brain surface looked good.  We then placed DuraGen into the dural defect and tacked it up with interrupted 4-0 Nurolon sutures.  This was covered with dry Gelfoam.  the craniotomy flap was replaced with doggie-bone plates. The wound was copiously irrigated.  the galea was then closed with interrupted 2-0 Vicryl suture. The skin was then closed with staples a sterile dressing was applied. The patient was then taken out of the 3-point Mayfield headrest and awakened from general anesthesia, and transported to the recovery room in stable condition. At the end of the procedure all sponge, needle, and instrument counts were correct.     PLAN OF CARE: Admit to inpatient   PATIENT DISPOSITION:  PACU - hemodynamically stable.   Delay start of Pharmacological VTE agent (>24hrs) due to surgical blood loss or risk of bleeding:  yes

## 2018-07-18 NOTE — Anesthesia Postprocedure Evaluation (Signed)
Anesthesia Post Note  Patient: April Peterson  Procedure(s) Performed: IR WITH ANESTHESIA ANGIOGRAM WITH POSSIBLE WITH POSSIBLE TUMOR EMBOLIZATION (N/A )     Patient location during evaluation: PACU Anesthesia Type: General Level of consciousness: sedated and patient cooperative Pain management: pain level controlled Vital Signs Assessment: post-procedure vital signs reviewed and stable Respiratory status: spontaneous breathing Cardiovascular status: stable Anesthetic complications: no    Last Vitals:  Vitals:   07/18/18 0700 07/18/18 0800  BP: 114/77   Pulse: (!) 46   Resp: 12   Temp:  36.8 C  SpO2: 97%     Last Pain:  Vitals:   07/18/18 0901  TempSrc:   PainSc: 0-No pain                 Nolon Nations

## 2018-07-18 NOTE — Anesthesia Preprocedure Evaluation (Signed)
Anesthesia Evaluation  Patient identified by MRN, date of birth, ID band Patient awake    Reviewed: Allergy & Precautions, NPO status , Patient's Chart, lab work & pertinent test results  History of Anesthesia Complications Negative for: history of anesthetic complications  Airway Mallampati: I  TM Distance: >3 FB Neck ROM: Full    Dental  (+) Teeth Intact, Dental Advisory Given   Pulmonary neg pulmonary ROS,    breath sounds clear to auscultation       Cardiovascular negative cardio ROS   Rhythm:Regular Rate:Normal     Neuro/Psych Left frontal meningioma negative psych ROS   GI/Hepatic negative GI ROS, Neg liver ROS,   Endo/Other  negative endocrine ROS  Renal/GU negative Renal ROS     Musculoskeletal negative musculoskeletal ROS (+)   Abdominal   Peds  Hematology negative hematology ROS (+)   Anesthesia Other Findings   Reproductive/Obstetrics 11 days post partum                             Anesthesia Physical Anesthesia Plan  ASA: II  Anesthesia Plan: General   Post-op Pain Management:    Induction: Intravenous  PONV Risk Score and Plan: 3 and Ondansetron and Dexamethasone  Airway Management Planned: Oral ETT  Additional Equipment:   Intra-op Plan:   Post-operative Plan: Extubation in OR  Informed Consent: I have reviewed the patients History and Physical, chart, labs and discussed the procedure including the risks, benefits and alternatives for the proposed anesthesia with the patient or authorized representative who has indicated his/her understanding and acceptance.     Dental advisory given  Plan Discussed with: CRNA and Surgeon  Anesthesia Plan Comments:         Anesthesia Quick Evaluation

## 2018-07-18 NOTE — Anesthesia Procedure Notes (Addendum)
Procedure Name: Intubation Date/Time: 07/18/2018 4:52 PM Performed by: [place] T, CRNA Pre-anesthesia Checklist: Patient identified, Emergency Drugs available, Suction available and Patient being monitored Patient Re-evaluated:Patient Re-evaluated prior to induction Oxygen Delivery Method: Circle system utilized Preoxygenation: Pre-oxygenation with 100% oxygen Induction Type: IV induction and Rapid sequence Laryngoscope Size: Miller and 2 Grade View: Grade I Tube type: Oral Tube size: 7.5 mm Number of attempts: 1 Airway Equipment and Method: Patient positioned with wedge pillow and Stylet Placement Confirmation: ETT inserted through vocal cords under direct vision,  positive ETCO2 and breath sounds checked- equal and bilateral Secured at: 21 cm Tube secured with: Tape Dental Injury: Teeth and Oropharynx as per pre-operative assessment

## 2018-07-18 NOTE — Anesthesia Postprocedure Evaluation (Signed)
Anesthesia Post Note  Patient: April Peterson  Procedure(s) Performed: LEFT CRANIOTOMY TUMOR EXCISION WITH BRAIN LAB (Left Head) APPLICATION OF CRANIAL NAVIGATION (Left Head)     Patient location during evaluation: PACU Anesthesia Type: General Level of consciousness: awake and alert Pain management: pain level controlled Vital Signs Assessment: post-procedure vital signs reviewed and stable Respiratory status: spontaneous breathing, nonlabored ventilation, respiratory function stable and patient connected to nasal cannula oxygen Cardiovascular status: blood pressure returned to baseline and stable Postop Assessment: no apparent nausea or vomiting Anesthetic complications: no    Last Vitals:  Vitals:   07/18/18 2100 07/18/18 2115  BP: 115/82   Pulse: (!) 58 62  Resp: 12 13  Temp:    SpO2: 97% 97%    Last Pain:  Vitals:   07/18/18 2030  TempSrc: Axillary  PainSc:                  Shann Merrick COKER

## 2018-07-18 NOTE — Progress Notes (Signed)
Bilateral contacts removed by patient and thrown away at request of patient.

## 2018-07-18 NOTE — Transfer of Care (Signed)
Immediate Anesthesia Transfer of Care Note  Patient: April Peterson  Procedure(s) Performed: LEFT CRANIOTOMY TUMOR EXCISION WITH BRAIN LAB (Left Head) APPLICATION OF CRANIAL NAVIGATION (Left Head)  Patient Location: PACU  Anesthesia Type:General  Level of Consciousness: awake, alert  and oriented  Airway & Oxygen Therapy: Patient connected to face mask oxygen  Post-op Assessment: Report given to RN, Post -op Vital signs reviewed and stable and Patient moving all extremities X 4  Post vital signs: Reviewed and stable  Last Vitals:  Vitals Value Taken Time  BP    Temp    Pulse    Resp    SpO2      Last Pain:  Vitals:   07/18/18 0901  TempSrc:   PainSc: 0-No pain      Patients Stated Pain Goal: 0 (66/29/47 6546)  Complications: No apparent anesthesia complications

## 2018-07-19 ENCOUNTER — Encounter (HOSPITAL_COMMUNITY): Payer: Self-pay | Admitting: Neurosurgery

## 2018-07-19 MED ORDER — PANTOPRAZOLE SODIUM 40 MG PO TBEC
40.0000 mg | DELAYED_RELEASE_TABLET | Freq: Every day | ORAL | Status: DC
  Administered 2018-07-19: 40 mg via ORAL
  Filled 2018-07-19: qty 1

## 2018-07-19 MED ORDER — LEVETIRACETAM 500 MG PO TABS
500.0000 mg | ORAL_TABLET | Freq: Two times a day (BID) | ORAL | Status: DC
  Administered 2018-07-19 – 2018-07-20 (×3): 500 mg via ORAL
  Filled 2018-07-19 (×3): qty 1

## 2018-07-19 NOTE — Progress Notes (Signed)
PT Cancellation Note  Patient Details Name: April Peterson MRN: [id number] DOB: January 23, 1980   Cancelled Treatment:    Reason Eval/Treat Not Completed: Active bedrest order Will follow.   [name] 07/19/2018, 7:00 AM Wray Kearns, PT, DPT Acute Rehabilitation Services Pager [phone] Office [phone]

## 2018-07-19 NOTE — Progress Notes (Signed)
Subjective: Patient reports doing really well, some mild headaches   Objective: Vital signs in last 24 hours: Temp:  [98 F (36.7 C)-99.6 F (37.6 C)] 98.3 F (36.8 C) (07/14 0400) Pulse Rate:  [43-88] 52 (07/14 0700) Resp:  [10-17] 12 (07/14 0700) BP: (106-136)/(68-90) 117/77 (07/14 0700) SpO2:  [95 %-98 %] 95 % (07/14 0700) Arterial Line BP: (95-157)/(65-87) 131/74 (07/14 0700)  Intake/Output from previous day: 07/13 0701 - 07/14 0700 In: 3790.9 [I.V.:3690.9; IV Piggyback:100] Out: 9643 [Urine:3125; Blood:50] Intake/Output this shift: No intake/output data recorded.  Neurologic: Grossly normal  Lab Results: Lab Results  Component Value Date   WBC 12.4 (H) 07/13/2018   HGB 14.6 07/13/2018   HCT 43.0 07/13/2018   MCV 92.3 07/13/2018   PLT 286 07/13/2018   Lab Results  Component Value Date   INR 1.1 07/13/2018   BMET Lab Results  Component Value Date   NA 141 07/13/2018   K 4.0 07/13/2018   CL 109 07/13/2018   CO2 22 07/13/2018   GLUCOSE 99 07/13/2018   BUN 17 07/13/2018   CREATININE 0.70 07/13/2018   CALCIUM 9.4 07/13/2018    Studies/Results: No results found.  Assessment/Plan: Postop day 1 left crani for tumor resection. Doing very well today. D/c foley and aline. Transfer to floor, ambulate today and advance diet. Likely home tomorrow.    LOS: 5 days    [facility] 07/19/2018, 8:18 AM

## 2018-07-19 NOTE — Evaluation (Signed)
Occupational Therapy Evaluation Patient Details Name: April Peterson MRN: [id number] DOB: [birth year] Today's Date: 07/19/2018    History of Present Illness Patient is a 38 y/o female who presents with speech difficulties and right sided weakness. 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 Pt gave birth 10 days ago. No PMH.   Clinical Impression   Patient is s/p craniotomy with resection 7/13 for meniogioma surgery resulting in functional limitations due to the deficits listed below (see OT problem list). Pt with decr activity tolerance but progressing well. Ot to see one more time for balance with adls and help with any questions regarding self care related to breast milk leaves application.  Patient will benefit from skilled OT acutely to increase independence and safety with ADLS to allow discharge home.     Follow Up Recommendations  No OT follow up    Equipment Recommendations  None recommended by OT    Recommendations for Other Services       Precautions / Restrictions Precautions Precautions: None Precaution Comments: watch HR Restrictions Weight Bearing Restrictions: No Other Position/Activity Restrictions: post mastectomy bra ordered as well as cabbage leaves as pt was breast feeding PTA      Mobility Bed Mobility Overal bed mobility: Modified Independent             General bed mobility comments: HOB elevated, guarded movement to get to EOB, fearful of moving head.  Transfers Overall transfer level: Needs assistance Equipment used: None Transfers: Sit to/from Stand Sit to Stand: Supervision         General transfer comment: Supervision for safety. Stood from [employer], from toilet x1. Transferred to chair post ambulation.    Balance Overall balance assessment: Needs assistance Sitting-balance support: Feet supported;No upper extremity supported Sitting balance-Leahy Scale: Good     Standing balance support: During  functional activity Standing balance-Leahy Scale: Good Standing balance comment: Able to stand at sink and brush teeth without difficulty.                           ADL either performed or assessed with clinical judgement   ADL Overall ADL's : Needs assistance/impaired Eating/Feeding: Modified independent   Grooming: Oral care;Set up;Standing Grooming Details (indicate cue type and reason): pt distracted by incision on skull. OT helping pull hair back to help visually hide the incision Upper Body Bathing: Min guard   Lower Body Bathing: Min guard   Upper Body Dressing : Min guard   Lower Body Dressing: Min guard   Toilet Transfer: Min guard           Functional mobility during ADLs: Min guard General ADL Comments: pt able to sequence all task but with a slower speed than baseline Pt asking for assistance to help dry up breast milk currently. OT ordering a mastectomy bra and cabbage leaves from kitchen. RN notified of pending items. Pt expressing gratitude to therapist     Vision Baseline Vision/History: No visual deficits       Perception     Praxis      Pertinent Vitals/Pain Pain Assessment: 0-10 Pain Score: 3  Pain Location: headache Pain Descriptors / Indicators: Headache;Operative site guarding Pain Intervention(s): Repositioned;Monitored during session;Patient requesting pain meds-RN notified     Hand Dominance Right   Extremity/Trunk Assessment Upper Extremity Assessment Upper Extremity Assessment: Overall WFL for tasks assessed   Lower Extremity Assessment Lower Extremity Assessment: Defer to PT evaluation  Cervical / Trunk Assessment Cervical / Trunk Assessment: Normal   Communication Communication Communication: No difficulties   Cognition Arousal/Alertness: Awake/alert Behavior During Therapy: WFL for tasks assessed/performed Overall Cognitive Status: Within Functional Limits for tasks assessed                                  General Comments: Pt overwhelmed.   General Comments  incision dry .    Exercises     Shoulder Instructions      Home Living Family/patient expects to be discharged to:: Private residence Living Arrangements: Spouse/significant other;Children Available Help at Discharge: Family;Available 24 hours/day(spouse can work from home.) Type of Home: House Home Access: Stairs to enter [employer] of Steps: a few   Home Layout: Two level;Able to live on main level with bedroom/bathroom Alternate Level Stairs-Number of Steps: 1 flight   Bathroom Shower/Tub: Occupational psychologist: Standard     Home Equipment: None   Additional Comments: has 36 yo and a 79 day old baby      Prior Functioning/Environment Level of Independence: Independent        Comments: Has a 78.35 year old and 46 day old newborn. Does not work. Has help from mother and mother inlaw who are local.        OT Problem List: Decreased activity tolerance;Impaired balance (sitting and/or standing);Pain      OT Treatment/Interventions: Self-care/ADL training;Therapeutic exercise;Therapeutic activities;Balance training    OT Goals(Current goals can be found in the care plan section) Acute Rehab OT Goals Patient Stated Goal: to get home to my babies OT Goal Formulation: With patient Time For Goal Achievement: 08/02/18 Potential to Achieve Goals: Good  OT Frequency: Min 2X/week   Barriers to D/C:            Co-evaluation              AM-PAC OT "6 Clicks" Daily Activity     Outcome Measure Help from another person eating meals?: None Help from another person taking care of personal grooming?: A Little Help from another person toileting, which includes using toliet, bedpan, or urinal?: A Little Help from another person bathing (including washing, rinsing, drying)?: A Little Help from another person to put on and taking off regular upper body clothing?: None Help from  another person to put on and taking off regular lower body clothing?: A Little 6 Click Score: 20   End of Session Equipment Utilized During Treatment: Gait belt Nurse Communication: Mobility status;Precautions  Activity Tolerance: Patient tolerated treatment well Patient left: in chair;with call bell/phone within reach  OT Visit Diagnosis: Muscle weakness (generalized) (M62.81)                Time: 5027-7412 OT Time Calculation (min): 23 min Charges:  OT General Charges $OT Visit: 1 Visit OT Evaluation $OT Eval Low Complexity: 1 Low   Jeri Modena, OTR/L  Acute Rehabilitation Services Pager: [phone] Office: [phone] .   Jeri Modena 07/19/2018, 11:27 AM

## 2018-07-19 NOTE — Evaluation (Addendum)
Physical Therapy Evaluation Patient Details Name: April Peterson MRN: [id number] DOB: 11-07-1980 Today's Date: 07/19/2018   History of Present Illness  Patient is a 38 y/o female who presents with speech difficulties and right sided weakness. 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 Pt gave birth 10 days ago. No PMH.  Clinical Impression  Patient presents with pain and post surgical deficits s/p above surgery. Pt independent PTA and has 2 babies (62.61 year old boy and newborn girl, 67 days old). Pt tolerated transfers and gait training with supervision for safety. Reports headache worsened with movement. RN notified for pain meds. HR up to 133 bpm. Pt has supportive spouse, mother and mother in law at home to help with kids. Will plan for stair training and higher level balance challenges next session as tolerated. Pending progress tomorrow may been OPPT. Will follow acutely to maximize independence and mobility prior to return home.     Follow Up Recommendations No PT follow up;Supervision - Intermittent    Equipment Recommendations  None recommended by PT    Recommendations for Other Services       Precautions / Restrictions Precautions Precautions: None Precaution Comments: watch HR Restrictions Weight Bearing Restrictions: No Other Position/Activity Restrictions: post mastectomy bra ordered as well as cabbage leaves as pt was breast feeding PTA      Mobility  Bed Mobility Overal bed mobility: Modified Independent             General bed mobility comments: HOB elevated, guarded movement to get to EOB, fearful of moving head.  Transfers Overall transfer level: Needs assistance Equipment used: None Transfers: Sit to/from Stand Sit to Stand: Supervision         General transfer comment: Supervision for safety. Stood from [employer], from toilet x1. Transferred to chair post ambulation.  Ambulation/Gait Ambulation/Gait assistance:  Supervision Gait Distance (Feet): 500 Feet Assistive device: None Gait Pattern/deviations: Step-through pattern;Decreased stride length   Gait velocity interpretation: 1.31 - 2.62 ft/sec, indicative of limited community ambulator General Gait Details: Slow, guarded gait. No evidence of imbalance. HR up to 133 bpm.  Stairs            Wheelchair Mobility    Modified Rankin (Stroke Patients Only)       Balance Overall balance assessment: Needs assistance Sitting-balance support: Feet supported;No upper extremity supported Sitting balance-Leahy Scale: Good     Standing balance support: During functional activity Standing balance-Leahy Scale: Good Standing balance comment: Able to stand at sink and brush teeth without difficulty.                             Pertinent Vitals/Pain Pain Assessment: 0-10 Pain Score: 3  Pain Location: headache Pain Descriptors / Indicators: Headache;Operative site guarding Pain Intervention(s): Repositioned;Monitored during session;Patient requesting pain meds-RN notified    Home Living Family/patient expects to be discharged to:: Private residence Living Arrangements: Spouse/significant other;Children Available Help at Discharge: Family;Available 24 hours/day(spouse can work from home.) Type of Home: House Home Access: Stairs to enter   [employer] of Steps: a few Home Layout: Two level;Able to live on main level with bedroom/bathroom Home Equipment: None      Prior Function Level of Independence: Independent         Comments: Has a 31.36 year old and 11 day old newborn. Does not work. Has help from mother and mother inlaw who are local.     Hand  Dominance        Extremity/Trunk Assessment   Upper Extremity Assessment Upper Extremity Assessment: Defer to OT evaluation    Lower Extremity Assessment Lower Extremity Assessment: Overall WFL for tasks assessed    Cervical / Trunk Assessment Cervical /  Trunk Assessment: Normal  Communication   Communication: No difficulties  Cognition Arousal/Alertness: Awake/alert Behavior During Therapy: WFL for tasks assessed/performed Overall Cognitive Status: Within Functional Limits for tasks assessed                                 General Comments: Pt overwhelmed.      General Comments General comments (skin integrity, edema, etc.): HR ranged from 100-133 bpm.    Exercises     Assessment/Plan    PT Assessment Patient needs continued PT services  PT Problem List Decreased mobility;Pain;Decreased skin integrity       PT Treatment Interventions Gait training;Stair training;Patient/family education;Functional mobility training;Neuromuscular re-education;Therapeutic activities    PT Goals (Current goals can be found in the Care Plan section)  Acute Rehab PT Goals Patient Stated Goal: to get home to my babies PT Goal Formulation: With patient Time For Goal Achievement: 08/02/18 Potential to Achieve Goals: Good    Frequency Min 4X/week   Barriers to discharge        Co-evaluation               AM-PAC PT "6 Clicks" Mobility  Outcome Measure Help needed turning from your back to your side while in a flat bed without using bedrails?: None Help needed moving from lying on your back to sitting on the side of a flat bed without using bedrails?: A Little Help needed moving to and from a bed to a chair (including a wheelchair)?: None Help needed standing up from a chair using your arms (e.g., wheelchair or bedside chair)?: None Help needed to walk in hospital room?: None Help needed climbing 3-5 steps with a railing? : A Little 6 Click Score: 22    End of Session   Activity Tolerance: Patient tolerated treatment well Patient left: in chair;with call bell/phone within reach;with family/visitor present Nurse Communication: Mobility status PT Visit Diagnosis: Pain;Difficulty in walking, not elsewhere classified  (R26.2) Pain - part of body: (head)    Time: 8938-1017 PT Time Calculation (min) (ACUTE ONLY): 21 min   Charges:   PT Evaluation $PT Eval Moderate Complexity: 1 Mod          Wray Kearns, PT, DPT Acute Rehabilitation Services Pager [phone] Office [phone]      [place] 07/19/2018, 9:34 AM

## 2018-07-20 MED ORDER — LEVETIRACETAM 500 MG PO TABS: 500.0000 mg | ORAL_TABLET | Freq: Two times a day (BID) | ORAL | 0 refills | Status: DC

## 2018-07-20 MED ORDER — METHYLPREDNISOLONE 4 MG PO TBPK: ORAL_TABLET | ORAL | 0 refills | Status: DC

## 2018-07-20 NOTE — Discharge Summary (Signed)
Physician Discharge Summary  Patient ID: April Peterson MRN: [id number] DOB/AGE: 36-Aug-1982 36 y.o.  Admit date: 07/13/2018 Discharge date: 07/20/2018  Admission Diagnoses: L frontal brain mass    Discharge Diagnoses: same   Discharged Condition: good  Hospital Course: The patient was admitted on 07/13/2018 with speech difficulties. She had an MRI showing a large L frontal meningioma. It was embolized in IR by dr Kathyrn Sheriff and she was then taken to the operating room where the patient underwent frontal craniotomy for BT. The patient tolerated the procedure well and was taken to the recovery room and then to the icu in stable condition. The hospital course was routine. There were no complications. The wound remained clean dry and intact. Pt had appropriate head soreness. No complaints of arm pain or new N/T/W. The patient remained afebrile with stable vital signs, and tolerated a regular diet. The patient continued to increase activities, and pain was well controlled with oral pain medications.   Consults: None  Significant Diagnostic Studies:  Results for orders placed or performed during the hospital encounter of 07/13/18  SARS Coronavirus 2 (CEPHEID - Performed in [place] hospital lab), [facility] Order   Specimen: Nasopharyngeal Swab  Result Value Ref Range   SARS Coronavirus 2 NEGATIVE NEGATIVE  Surgical pcr screen   Specimen: Nasal Mucosa; Nasal Swab  Result Value Ref Range   MRSA, PCR NEGATIVE NEGATIVE   Staphylococcus aureus NEGATIVE NEGATIVE  Protime-INR  Result Value Ref Range   Prothrombin Time 14.0 11.4 - 15.2 seconds   INR 1.1 0.8 - 1.2  APTT  Result Value Ref Range   aPTT 37 (H) 24 - 36 seconds  CBC  Result Value Ref Range   WBC 12.4 (H) 4.0 - 10.5 K/uL   RBC 4.65 3.87 - 5.11 MIL/uL   Hemoglobin 14.0 12.0 - 15.0 g/dL   HCT 42.9 36.0 - 46.0 %   MCV 92.3 80.0 - 100.0 fL   MCH 30.1 26.0 - 34.0 pg   MCHC 32.6 30.0 - 36.0 g/dL   RDW 12.8 11.5 - 15.5 %   Platelets  286 150 - 400 K/uL   nRBC 0.0 0.0 - 0.2 %  Differential  Result Value Ref Range   Neutrophils Relative % 65 %   Neutro Abs 8.0 (H) 1.7 - 7.7 K/uL   Lymphocytes Relative 26 %   Lymphs Abs 3.2 0.7 - 4.0 K/uL   Monocytes Relative 8 %   Monocytes Absolute 1.0 0.1 - 1.0 K/uL   Eosinophils Relative 1 %   Eosinophils Absolute 0.1 0.0 - 0.5 K/uL   Basophils Relative 0 %   Basophils Absolute 0.0 0.0 - 0.1 K/uL   Immature Granulocytes 0 %   Abs Immature Granulocytes 0.04 0.00 - 0.07 K/uL  Comprehensive metabolic panel  Result Value Ref Range   Sodium 140 135 - 145 mmol/L   Potassium 4.2 3.5 - 5.1 mmol/L   Chloride 109 98 - 111 mmol/L   CO2 22 22 - 32 mmol/L   Glucose, Bld 100 (H) 70 - 99 mg/dL   BUN 17 6 - 20 mg/dL   Creatinine, Ser 0.73 0.44 - 1.00 mg/dL   Calcium 9.4 8.9 - 10.3 mg/dL   Total Protein 6.9 6.5 - 8.1 g/dL   Albumin 3.4 (L) 3.5 - 5.0 g/dL   AST 23 15 - 41 U/L   ALT 27 0 - 44 U/L   Alkaline Phosphatase 89 38 - 126 U/L   Total Bilirubin 0.8 0.3 - 1.2  mg/dL   GFR calc non Af Amer >60 >60 mL/min   GFR calc Af Amer >60 >60 mL/min   Anion gap 9 5 - 15  I-stat chem 8, ED  Result Value Ref Range   Sodium 141 135 - 145 mmol/L   Potassium 4.0 3.5 - 5.1 mmol/L   Chloride 109 98 - 111 mmol/L   BUN 17 6 - 20 mg/dL   Creatinine, Ser 0.70 0.44 - 1.00 mg/dL   Glucose, Bld 99 70 - 99 mg/dL   Calcium, Ion 1.18 1.15 - 1.40 mmol/L   TCO2 24 22 - 32 mmol/L   Hemoglobin 14.6 12.0 - 15.0 g/dL   HCT 43.0 36.0 - 46.0 %  CBG monitoring, ED  Result Value Ref Range   Glucose-Capillary 185 (H) 70 - 99 mg/dL  I-Stat beta hCG blood, ED  Result Value Ref Range   I-stat hCG, quantitative 5.3 (H) <5 mIU/mL   Comment 3          Type and screen [place]  Result Value Ref Range   ABO/RH(D) A POS    Antibody Screen NEG    Sample Expiration      07/21/2018,2359 Performed at [facility], [address], [place], [place] [address]     Ct Angio Head W Or Wo  Contrast  Result Date: 07/13/2018 CLINICAL DATA:  Initial evaluation for acute altered mental status. EXAM: CT HEAD WITHOUT CONTRAST CT ANGIOGRAPHY HEAD AND NECK CT HEAD VENOGRAM TECHNIQUE: Multidetector CT imaging of the head and neck was performed using the standard protocol during bolus administration of intravenous contrast. Multiplanar CT image reconstructions and MIPs were obtained to evaluate the vascular anatomy. Carotid stenosis measurements (when applicable) are obtained utilizing NASCET criteria, using the distal internal carotid diameter as the denominator. CONTRAST:  87mL OMNIPAQUE IOHEXOL 350 MG/ML SOLN COMPARISON:  None available. FINDINGS: CT HEAD FINDINGS Brain: There is a large homogeneous Lea hyperdense lesion positioned at the left frontal lobe that measures 5.2 x 5.3 x 4.8 cm (transverse by AP by craniocaudad), felt to be most consistent with an underlying mass lesion, likely meningioma. Associated mild localized vasogenic edema. Regional mass effect with up to 7 mm of left-to-right shift. Left lateral ventricle is partially effaced. No hydrocephalus or ventricular trapping at this time. Basilar cisterns remain patent. No other mass lesion. No definite acute intracranial hemorrhage. No acute large vessel territory infarct. No extra-axial fluid collection. Vascular: No hyperdense vessel. Skull: Scalp soft tissues demonstrate no acute abnormality. Subtle permeated of change noted at the left frontal calvarium at the level of the left frontal mass (series 4, image 69). Few scattered prominent dural calcifications noted. Sinuses/Orbits: Globes and orbital soft tissues demonstrate no acute finding. Mild scattered mucosal thickening throughout the ethmoidal air cells and maxillary sinuses. Paranasal sinuses are otherwise clear. No mastoid effusion. Middle ear cavities clear. Other: None. ASPECTS ([place] Stroke Program Early CT Score) - Ganglionic level infarction (caudate, lentiform nuclei, internal  capsule, insula, M1-M3 cortex): 7 - Supraganglionic infarction (M4-M6 cortex): 3 Total score (0-10 with 10 being normal): 10 CTA NECK FINDINGS Aortic arch: Visualized aortic arch of normal caliber with normal branch pattern. No flow-limiting stenosis about the origin of the great vessels. Visualized subclavian arteries widely patent. Right carotid system: Right common and internal carotid arteries are widely patent without stenosis, dissection, or occlusion. No atheromatous narrowing about the right carotid bifurcation. Left carotid system: Left common and internal carotid arteries are widely patent without stenosis,  dissection or occlusion. No atheromatous narrowing about the left carotid bifurcation. Vertebral arteries: Both vertebral arteries arise from the subclavian arteries. Vertebral arteries widely patent within the neck without stenosis, dissection or occlusion. Skeleton: No acute osseous abnormality. No discrete lytic or blastic osseous lesions. Other neck: No other acute soft tissue abnormality within the neck. Upper chest: Visualized upper chest demonstrates no acute finding. Review of the MIP images confirms the above findings CTA HEAD FINDINGS Anterior circulation: Internal carotid arteries widely patent to the termini without stenosis. A1 segments widely patent. Normal anterior communicating artery complex. Anterior cerebral arteries deviated to the right but widely patent to their distal aspects without stenosis. No M1 stenosis or occlusion. Normal MCA bifurcations. Distal MCA branches well perfused and symmetric. Posterior circulation: Both vertebral arteries widely patent to the vertebrobasilar junction. Left vertebral artery dominant. Posteroinferior cerebral arteries patent bilaterally. Basilar artery widely patent to its distal aspect without stenosis. Superior cerebral arteries patent bilaterally. Right PCA supplied mainly via the basilar. Hypoplastic left P1 with prominent left posterior  communicating artery. PCAs well perfused to their distal aspects. Venous sinuses: Major dural sinuses including the superior sagittal sinus, transverse sinuses, and sigmoid sinuses appear patent as do the visualized internal jugular veins. Right transverse sinus dominant, with hypoplastic left transverse sinus. Straight sinus, vein of Galen, and internal cerebral veins patent. No evidence for dural sinus thrombosis. CT venogram confirms these findings. Anatomic variants: None significant.  No intracranial aneurysm. Delayed phase: Not performed. Left frontal mass lesion does demonstrate some intrinsic enhancement on delayed venogram images. Additionally, note made of a prominent feeder vessel extending from the overlying meninges on arterial phase images (series 7, image 38). No definite underlying AVM or other vascular abnormality. Review of the MIP images confirms the above findings IMPRESSION: CT HEAD IMPRESSION: 1. 5.2 x 5.3 x 4.8 cm homogeneous hyperdense mass centered at the left frontal lobe, most characteristic for a meningioma. Associated regional mass effect with up to 7 mm of left-to-right shift. No hydrocephalus or ventricular trapping. Further assessment with dedicated MRI, with and without contrast, recommended for further characterization. 2. Otherwise negative head CT. 3. ASPECTS = 10 CTA HEAD AND NECK IMPRESSION: 1. Negative CTA for emergent large vessel occlusion. No hemodynamically significant stenosis or other acute vascular abnormality identified. 2. Prominent and tortuous vessel within the central aspect of the left frontal lobe mass, most characteristic for an enlarged hypertrophied feeder vessel. No definite underlying AVM or other vascular abnormality. CT VENOGRAM IMPRESSION: Negative CT venogram. No evidence for dural sinus thrombosis or other abnormality. Results were discussed by telephone at the time of interpretation on 07/13/2018 at 10:30 pm with Dr. Samara Snide. Electronically Signed    By: Jeannine Boga M.D.   On: 07/13/2018 23:20   Ct Angio Neck W Or Wo Contrast  Result Date: 07/13/2018 CLINICAL DATA:  Initial evaluation for acute altered mental status. EXAM: CT HEAD WITHOUT CONTRAST CT ANGIOGRAPHY HEAD AND NECK CT HEAD VENOGRAM TECHNIQUE: Multidetector CT imaging of the head and neck was performed using the standard protocol during bolus administration of intravenous contrast. Multiplanar CT image reconstructions and MIPs were obtained to evaluate the vascular anatomy. Carotid stenosis measurements (when applicable) are obtained utilizing NASCET criteria, using the distal internal carotid diameter as the denominator. CONTRAST:  14mL OMNIPAQUE IOHEXOL 350 MG/ML SOLN COMPARISON:  None available. FINDINGS: CT HEAD FINDINGS Brain: There is a large homogeneous Lea hyperdense lesion positioned at the left frontal lobe that measures 5.2 x 5.3 x 4.8 cm (  transverse by AP by craniocaudad), felt to be most consistent with an underlying mass lesion, likely meningioma. Associated mild localized vasogenic edema. Regional mass effect with up to 7 mm of left-to-right shift. Left lateral ventricle is partially effaced. No hydrocephalus or ventricular trapping at this time. Basilar cisterns remain patent. No other mass lesion. No definite acute intracranial hemorrhage. No acute large vessel territory infarct. No extra-axial fluid collection. Vascular: No hyperdense vessel. Skull: Scalp soft tissues demonstrate no acute abnormality. Subtle permeated of change noted at the left frontal calvarium at the level of the left frontal mass (series 4, image 69). Few scattered prominent dural calcifications noted. Sinuses/Orbits: Globes and orbital soft tissues demonstrate no acute finding. Mild scattered mucosal thickening throughout the ethmoidal air cells and maxillary sinuses. Paranasal sinuses are otherwise clear. No mastoid effusion. Middle ear cavities clear. Other: None. ASPECTS ([place] Stroke Program  Early CT Score) - Ganglionic level infarction (caudate, lentiform nuclei, internal capsule, insula, M1-M3 cortex): 7 - Supraganglionic infarction (M4-M6 cortex): 3 Total score (0-10 with 10 being normal): 10 CTA NECK FINDINGS Aortic arch: Visualized aortic arch of normal caliber with normal branch pattern. No flow-limiting stenosis about the origin of the great vessels. Visualized subclavian arteries widely patent. Right carotid system: Right common and internal carotid arteries are widely patent without stenosis, dissection, or occlusion. No atheromatous narrowing about the right carotid bifurcation. Left carotid system: Left common and internal carotid arteries are widely patent without stenosis, dissection or occlusion. No atheromatous narrowing about the left carotid bifurcation. Vertebral arteries: Both vertebral arteries arise from the subclavian arteries. Vertebral arteries widely patent within the neck without stenosis, dissection or occlusion. Skeleton: No acute osseous abnormality. No discrete lytic or blastic osseous lesions. Other neck: No other acute soft tissue abnormality within the neck. Upper chest: Visualized upper chest demonstrates no acute finding. Review of the MIP images confirms the above findings CTA HEAD FINDINGS Anterior circulation: Internal carotid arteries widely patent to the termini without stenosis. A1 segments widely patent. Normal anterior communicating artery complex. Anterior cerebral arteries deviated to the right but widely patent to their distal aspects without stenosis. No M1 stenosis or occlusion. Normal MCA bifurcations. Distal MCA branches well perfused and symmetric. Posterior circulation: Both vertebral arteries widely patent to the vertebrobasilar junction. Left vertebral artery dominant. Posteroinferior cerebral arteries patent bilaterally. Basilar artery widely patent to its distal aspect without stenosis. Superior cerebral arteries patent bilaterally. Right PCA  supplied mainly via the basilar. Hypoplastic left P1 with prominent left posterior communicating artery. PCAs well perfused to their distal aspects. Venous sinuses: Major dural sinuses including the superior sagittal sinus, transverse sinuses, and sigmoid sinuses appear patent as do the visualized internal jugular veins. Right transverse sinus dominant, with hypoplastic left transverse sinus. Straight sinus, vein of Galen, and internal cerebral veins patent. No evidence for dural sinus thrombosis. CT venogram confirms these findings. Anatomic variants: None significant.  No intracranial aneurysm. Delayed phase: Not performed. Left frontal mass lesion does demonstrate some intrinsic enhancement on delayed venogram images. Additionally, note made of a prominent feeder vessel extending from the overlying meninges on arterial phase images (series 7, image 38). No definite underlying AVM or other vascular abnormality. Review of the MIP images confirms the above findings IMPRESSION: CT HEAD IMPRESSION: 1. 5.2 x 5.3 x 4.8 cm homogeneous hyperdense mass centered at the left frontal lobe, most characteristic for a meningioma. Associated regional mass effect with up to 7 mm of left-to-right shift. No hydrocephalus or ventricular trapping. Further assessment with  dedicated MRI, with and without contrast, recommended for further characterization. 2. Otherwise negative head CT. 3. ASPECTS = 10 CTA HEAD AND NECK IMPRESSION: 1. Negative CTA for emergent large vessel occlusion. No hemodynamically significant stenosis or other acute vascular abnormality identified. 2. Prominent and tortuous vessel within the central aspect of the left frontal lobe mass, most characteristic for an enlarged hypertrophied feeder vessel. No definite underlying AVM or other vascular abnormality. CT VENOGRAM IMPRESSION: Negative CT venogram. No evidence for dural sinus thrombosis or other abnormality. Results were discussed by telephone at the time of  interpretation on 07/13/2018 at 10:30 pm with Dr. Samara Snide. Electronically Signed   By: Jeannine Boga M.D.   On: 07/13/2018 23:20   Mr Jeri Cos PY Contrast  Result Date: 07/14/2018 CLINICAL DATA:  Preoperative planning for left-sided meningioma. Brain lab study. Ten days postpartum. EXAM: MRI HEAD WITHOUT AND WITH CONTRAST TECHNIQUE: Multiplanar, multiecho pulse sequences of the brain and surrounding structures were obtained without and with intravenous contrast. CONTRAST:  9 cc Gadavist COMPARISON:  Earlier same day FINDINGS: Brain: BrainLAB protocol performed for operative guidance. The examination is obviously stable compared earlier today. 5.3 x 5.6 x 5.2 cm meningioma at the left frontal vertex indenting the brain. Left-to-right midline brain shift of 7 mm. Mild adjacent vasogenic edema. See earlier study for full report. IMPRESSION: [place] study for operative guidance. No change since earlier today. Electronically Signed   By: Nelson Chimes M.D.   On: 07/14/2018 17:36   Mr Jeri Cos KD Contrast  Result Date: 07/14/2018 CLINICAL DATA:  Follow-up examination for acute stroke, mass on prior CT. EXAM: MRI HEAD WITHOUT AND WITH CONTRAST TECHNIQUE: Multiplanar, multiecho pulse sequences of the brain and surrounding structures were obtained without and with intravenous contrast. CONTRAST:  9 cc of Gadavist. COMPARISON:  Prior CT and CTA from 07/13/2018. FINDINGS: Brain: Again seen is a large well-circumscribed mass centered at the left frontal convexity. Lesion measures 5.3 x 5.6 x 5.2 cm in maximal dimensions (AP by transverse by craniocaudad). Thin rim/cleft of CSF seen surrounding the lesion on T2 weighted sequence, consistent with an extra-axial mass. Lesion demonstrates hypointense precontrast T1 signal intensity with homogeneous solid postcontrast enhancement. Finding most consistent with a meningioma. Prominent flow void seen coursing from the overlying meninges to the central size deep aspect  of the lesion, consistent with a large arterial feeder vessel (series 10, image 22). Overlying dural thickening and enhancement. Scattered areas of internal susceptibility artifact likely reflect blood products. Small amount of surrounding vasogenic edema within the adjacent frontal lobe. Regional mass effect with associated 7 mm of left-to-right shift. Left lateral ventricle partially compressed. No hydrocephalus or ventricular trapping. Basilar cisterns remain patent. Remainder the brain is normal in appearance. No evidence for acute or subacute infarct. Gray-white matter differentiation otherwise maintained. No areas of remote cortical infarction. No other evidence for acute or chronic intracranial hemorrhage. No other mass lesion or abnormal enhancement. Pituitary gland suprasellar region within normal limits. Midline structures intact. Vascular: Major intracranial vascular flow voids are maintained. Skull and upper cervical spine: Craniocervical junction within normal limits. Upper cervical spine normal. Bone marrow signal intensity within normal limits. No scalp soft tissue abnormality. Few scattered dural calcifications again noted. Sinuses/Orbits: Globes and orbital soft tissues within normal limits. Paranasal sinuses are clear. No mastoid effusion. Inner ear structures grossly normal. Other: None. IMPRESSION: 1. 5.3 x 5.6 x 5.2 cm extra-axial mass overlying the left frontal convexity, most consistent with meningioma. Associated regional mass effect  with up to 7 mm of left-to-right shift. 2. Otherwise normal brain MRI. Electronically Signed   By: Jeannine Boga M.D.   On: 07/14/2018 01:27   Ir Transcath/emboliz  Result Date: 07/19/2018 PROCEDURE: DIAGNOSTIC CEREBRAL ANGIOGRAM ONYX EMBOLIZATION OF LEFT FRONTAL TUMOR HISTORY: The patient is a 38 year old woman initially presenting to the emergency department 10 days postpartum with acute onset of aphasia. Workup included CT scan and MRI which  demonstrated a large extra-axial left frontal mass with significant local mass effect and midline shift consistent with meningioma. Given the size and symptomatic nature of this lesion, surgical resection is planned in the next few days. Preoperative angiogram for possible tumor embolization was therefore requested. ACCESS: The technical aspects of the procedure as well as its potential risks and benefits were reviewed with the patient and her husband. These risks included but were not limited to stroke, intracranial hemorrhage, bleeding, infection, allergic reaction, damage to organs or vital structures, stroke, non-diagnostic procedure, and the catastrophic outcomes of heart attack, coma, and death. With an understanding of these risks, informed consent was obtained and witnessed. The patient was placed in the supine position on the angiography table and the skin of right groin prepped in the usual sterile fashion. The procedure was performed under general anesthesia. A 5- French sheath was introduced in the right common femoral artery using Seldinger technique. MEDICATIONS: HEPARIN: 3000 Units total. CONTRAST:  62mL OMNIPAQUE IOHEXOL 300 MG/ML  SOLNcc, Omnipaque 300 FLUOROSCOPY TIME:  FLUOROSCOPY TIME: See IR records TECHNIQUE: CATHETERS AND WIRES 5-French JB-1 catheter 180 cm 0.035" glidewire 5-Fr MPD guide catheter 12mm x 36mm Scepter XC balloon catheter Synchro 2 standard microwire LIQUID EMBOLIC USED Onyx 34 VESSELS CATHETERIZED Left internal carotid Left external carotid Left middle meningeal artery Right common femoral * VESSELS STUDIED Left internal carotid, head Left external carotid, head Left external carotid artery, head (pre-embolization) Left external carotid artery, head (immediate post-embolization) Left external carotid artery, head (final control) Left external carotid artery, head (final control) Right common femoral PROCEDURAL NARRATIVE A 5-Fr JB-1 glide catheter was advanced over a 0.035  glidewire into the aortic arch. The left internal and external carotid arteries were catheterized and angiogram taken. There was significant blood supply to the tumor from the external carotid artery and we therefore elected to proceed with embolization. The JB 1 glide catheter was removed and the MPD glide catheter was introduced over the Glidewire. The left external carotid artery was catheterized. The balloon microcatheter was then advanced under roadmap guidance over the synchro 2 standard microwire into the distal left middle meningeal artery just proximal to the tumor itself. The balloon catheter was then flushed with DMSO. The balloon was inflated to occlude the left middle meningeal artery, and embolization of the tumor with Onyx 34 was done over the course of multiple injections using standard blank road map technique. As we identified liquid embolic reflux into a an anastomotic meningeal vessel, this indicated good distal occlusion and we therefore elected to stop the procedure. The balloon was deflated. Immediate post embolization angiogram was taken demonstrating occlusion of the distal left middle meningeal artery and no further tumor blush from the external carotid supply. The balloon catheter was removed without incident. The guide catheter was then removed, and the JB 1 glide catheter was introduced. Final control internal and external carotid artery angiograms were taken. Glide catheter was then removed without incident. FINDINGS: Left internal carotid: head: Injection reveals the presence of a widely patent ICA, A1, and M1 segments and  their branches. There is shift of the distal segments of the anterior cerebral artery towards the right. There is tumor blush primarily in the inferior and lateral aspects of the tumor which appears to arise from distal branches of both the anterior and middle cerebral arteries. The parenchymal and venous phases are normal. No early venous shunting is seen. The  venous sinuses are widely patent. Left external carotid, head: The cervical branches of the left external carotid artery are unremarkable. The left middle meningeal artery does appear to be significantly hypertrophic, with a single large pedicle supplying a large portion of the left frontal tumor. There does appear to be a mild amount of venous shunting with slightly early opacification of the midportion of the superior sagittal sinus. Left external carotid (pre-embolization): Injection again demonstrates significant supply to the tumor via a large left middle meningeal artery pedicle. The balloon catheter is position within the distal left middle meningeal artery just proximal to the tumor itself. No pial parenchymal perfusion is identified. Left external carotid (immediate post-embolization): On ax cast is seen within the distal portion of the middle meningeal artery and within the tumor itself. The distal portion of the left middle meningeal artery is occluded. The catheter tip remains in stable position within the distal left middle meningeal artery. Left external carotid (final control): The proximal cervical branches of the external carotid artery are unremarkable. The distal portion of the left middle meningeal artery Mains occluded, with Onyx cast in this segment of the vessel and within the tumor. No brain perfusion is seen. Left internal carotid artery, head (final control): Injection reveals the presence of a widely patent ICA that leads to a patent ACA and MCA. No thrombus is visualized. Blood supply to the inferior and lateral portions of the tumor is unchanged. The parenchymal and venous phases are unremarkable. Right femoral: Normal vessel. No significant atherosclerotic disease. Arterial sheath in adequate position. DISPOSITION: Upon completion of the study, the femoral sheath was removed and hemostasis obtained using a 5-Fr ExoSeal closure device. Good proximal and distal lower extremity pulses were  documented upon achievement of hemostasis. The procedure was well tolerated and no early complications were observed. The patient was transferred to the postanesthesia care unit in stable hemodynamic condition. IMPRESSION: 1. Successful Onyx embolization of the left middle meningeal artery supply to a large left frontal tumor. The tumor continues to perfuse from supply via the anterior and middle cerebral arteries. The preliminary results of this procedure were shared with the patient and the patient's family. Electronically Signed   By: Consuella Lose   On: 07/19/2018 10:08   Ir Angiogram Follow Up Study  Result Date: 07/19/2018 PROCEDURE: DIAGNOSTIC CEREBRAL ANGIOGRAM  ONYX EMBOLIZATION OF LEFT FRONTAL TUMOR  HISTORY: The patient is a 38 year old woman initially presenting to the emergency department 10 days postpartum with acute onset of aphasia. Workup included CT scan and MRI which demonstrated a large extra-axial left frontal mass with significant local mass effect and midline shift consistent with meningioma. Given the size and symptomatic nature of this lesion, surgical resection is planned in the next few days. Preoperative angiogram for possible tumor embolization was therefore requested.  ACCESS: The technical aspects of the procedure as well as its potential risks and benefits were reviewed with the patient and her husband. These risks included but were not limited to stroke, intracranial hemorrhage, bleeding, infection, allergic reaction, damage to organs or vital structures, stroke, non-diagnostic procedure, and the catastrophic outcomes of heart attack, coma, and death.  With an understanding of these risks, informed consent was obtained and witnessed. The patient was placed in the supine position on the angiography table and the skin of right groin prepped in the usual sterile fashion. The procedure was performed under general anesthesia. A 5- French sheath was introduced in the right  common femoral artery using Seldinger technique.  MEDICATIONS: HEPARIN: 3000 Units total.  CONTRAST:  71mL OMNIPAQUE IOHEXOL 300 MG/ML  SOLNcc, Omnipaque 300  FLUOROSCOPY TIME:  FLUOROSCOPY TIME: See IR records  TECHNIQUE: CATHETERS AND WIRES 5-French JB-1 catheter  180 cm 0.035" glidewire  5-Fr MPD guide catheter  46mm x 44mm Scepter XC balloon catheter  Synchro 2 standard microwire  LIQUID EMBOLIC USED Onyx 34  VESSELS CATHETERIZED Left internal carotid  Left external carotid  Left middle meningeal artery  Right common femoral  *  VESSELS STUDIED Left internal carotid, head  Left external carotid, head  Left external carotid artery, head (pre-embolization)  Left external carotid artery, head (immediate post-embolization)  Left external carotid artery, head (final control)  Left external carotid artery, head (final control)  Right common femoral  PROCEDURAL NARRATIVE A 5-Fr JB-1 glide catheter was advanced over a 0.035 glidewire into the aortic arch. The left internal and external carotid arteries were catheterized and angiogram taken. There was significant blood supply to the tumor from the external carotid artery and we therefore elected to proceed with embolization. The JB 1 glide catheter was removed and the MPD glide catheter was introduced over the Glidewire. The left external carotid artery was catheterized.  The balloon microcatheter was then advanced under roadmap guidance over the synchro 2 standard microwire into the distal left middle meningeal artery just proximal to the tumor itself. The balloon catheter was then flushed with DMSO. The balloon was inflated to occlude the left middle meningeal artery, and embolization of the tumor with Onyx 34 was done over the course of multiple injections using standard blank road map technique. As we identified liquid embolic reflux into a an anastomotic meningeal vessel, this indicated good distal occlusion and we therefore elected to stop the  procedure. The balloon was deflated. Immediate post embolization angiogram was taken demonstrating occlusion of the distal left middle meningeal artery and no further tumor blush from the external carotid supply. The balloon catheter was removed without incident. The guide catheter was then removed, and the JB 1 glide catheter was introduced. Final control internal and external carotid artery angiograms were taken. Glide catheter was then removed without incident.  FINDINGS: Left internal carotid: head:  Injection reveals the presence of a widely patent ICA, A1, and M1 segments and their branches. There is shift of the distal segments of the anterior cerebral artery towards the right. There is tumor blush primarily in the inferior and lateral aspects of the tumor which appears to arise from distal branches of both the anterior and middle cerebral arteries. The parenchymal and venous phases are normal. No early venous shunting is seen. The venous sinuses are widely patent.  Left external carotid, head:  The cervical branches of the left external carotid artery are unremarkable. The left middle meningeal artery does appear to be significantly hypertrophic, with a single large pedicle supplying a large portion of the left frontal tumor. There does appear to be a mild amount of venous shunting with slightly early opacification of the midportion of the superior sagittal sinus.  Left external carotid (pre-embolization):  Injection again demonstrates significant supply to the tumor via a large left middle meningeal  artery pedicle. The balloon catheter is position within the distal left middle meningeal artery just proximal to the tumor itself. No pial parenchymal perfusion is identified.  Left external carotid (immediate post-embolization):  On ax cast is seen within the distal portion of the middle meningeal artery and within the tumor itself. The distal portion of the left middle meningeal artery is occluded. The  catheter tip remains in stable position within the distal left middle meningeal artery.  Left external carotid (final control):  The proximal cervical branches of the external carotid artery are unremarkable. The distal portion of the left middle meningeal artery Mains occluded, with Onyx cast in this segment of the vessel and within the tumor. No brain perfusion is seen.  Left internal carotid artery, head (final control):  Injection reveals the presence of a widely patent ICA that leads to a patent ACA and MCA. No thrombus is visualized. Blood supply to the inferior and lateral portions of the tumor is unchanged. The parenchymal and venous phases are unremarkable.  Right femoral:  Normal vessel. No significant atherosclerotic disease. Arterial sheath in adequate position.  DISPOSITION: Upon completion of the study, the femoral sheath was removed and hemostasis obtained using a 5-Fr ExoSeal closure device. Good proximal and distal lower extremity pulses were documented upon achievement of hemostasis. The procedure was well tolerated and no early complications were observed.  The patient was transferred to the postanesthesia care unit in stable hemodynamic condition.  IMPRESSION: 1. Successful Onyx embolization of the left middle meningeal artery supply to a large left frontal tumor. The tumor continues to perfuse from supply via the anterior and middle cerebral arteries.  The preliminary results of this procedure were shared with the patient and the patient's family.   Electronically Signed   By: Consuella Lose   On: 07/19/2018 10:08  Ct Venogram Head  Result Date: 07/13/2018 CLINICAL DATA:  Initial evaluation for acute altered mental status. EXAM: CT HEAD WITHOUT CONTRAST CT ANGIOGRAPHY HEAD AND NECK CT HEAD VENOGRAM TECHNIQUE: Multidetector CT imaging of the head and neck was performed using the standard protocol during bolus administration of intravenous contrast. Multiplanar CT image  reconstructions and MIPs were obtained to evaluate the vascular anatomy. Carotid stenosis measurements (when applicable) are obtained utilizing NASCET criteria, using the distal internal carotid diameter as the denominator. CONTRAST:  15mL OMNIPAQUE IOHEXOL 350 MG/ML SOLN COMPARISON:  None available. FINDINGS: CT HEAD FINDINGS Brain: There is a large homogeneous Lea hyperdense lesion positioned at the left frontal lobe that measures 5.2 x 5.3 x 4.8 cm (transverse by AP by craniocaudad), felt to be most consistent with an underlying mass lesion, likely meningioma. Associated mild localized vasogenic edema. Regional mass effect with up to 7 mm of left-to-right shift. Left lateral ventricle is partially effaced. No hydrocephalus or ventricular trapping at this time. Basilar cisterns remain patent. No other mass lesion. No definite acute intracranial hemorrhage. No acute large vessel territory infarct. No extra-axial fluid collection. Vascular: No hyperdense vessel. Skull: Scalp soft tissues demonstrate no acute abnormality. Subtle permeated of change noted at the left frontal calvarium at the level of the left frontal mass (series 4, image 69). Few scattered prominent dural calcifications noted. Sinuses/Orbits: Globes and orbital soft tissues demonstrate no acute finding. Mild scattered mucosal thickening throughout the ethmoidal air cells and maxillary sinuses. Paranasal sinuses are otherwise clear. No mastoid effusion. Middle ear cavities clear. Other: None. ASPECTS [facility] Stroke Program Early CT Score) - Ganglionic level infarction (caudate, lentiform nuclei, internal capsule, insula,  M1-M3 cortex): 7 - Supraganglionic infarction (M4-M6 cortex): 3 Total score (0-10 with 10 being normal): 10 CTA NECK FINDINGS Aortic arch: Visualized aortic arch of normal caliber with normal branch pattern. No flow-limiting stenosis about the origin of the great vessels. Visualized subclavian arteries widely patent. Right carotid  system: Right common and internal carotid arteries are widely patent without stenosis, dissection, or occlusion. No atheromatous narrowing about the right carotid bifurcation. Left carotid system: Left common and internal carotid arteries are widely patent without stenosis, dissection or occlusion. No atheromatous narrowing about the left carotid bifurcation. Vertebral arteries: Both vertebral arteries arise from the subclavian arteries. Vertebral arteries widely patent within the neck without stenosis, dissection or occlusion. Skeleton: No acute osseous abnormality. No discrete lytic or blastic osseous lesions. Other neck: No other acute soft tissue abnormality within the neck. Upper chest: Visualized upper chest demonstrates no acute finding. Review of the MIP images confirms the above findings CTA HEAD FINDINGS Anterior circulation: Internal carotid arteries widely patent to the termini without stenosis. A1 segments widely patent. Normal anterior communicating artery complex. Anterior cerebral arteries deviated to the right but widely patent to their distal aspects without stenosis. No M1 stenosis or occlusion. Normal MCA bifurcations. Distal MCA branches well perfused and symmetric. Posterior circulation: Both vertebral arteries widely patent to the vertebrobasilar junction. Left vertebral artery dominant. Posteroinferior cerebral arteries patent bilaterally. Basilar artery widely patent to its distal aspect without stenosis. Superior cerebral arteries patent bilaterally. Right PCA supplied mainly via the basilar. Hypoplastic left P1 with prominent left posterior communicating artery. PCAs well perfused to their distal aspects. Venous sinuses: Major dural sinuses including the superior sagittal sinus, transverse sinuses, and sigmoid sinuses appear patent as do the visualized internal jugular veins. Right transverse sinus dominant, with hypoplastic left transverse sinus. Straight sinus, vein of Galen, and  internal cerebral veins patent. No evidence for dural sinus thrombosis. CT venogram confirms these findings. Anatomic variants: None significant.  No intracranial aneurysm. Delayed phase: Not performed. Left frontal mass lesion does demonstrate some intrinsic enhancement on delayed venogram images. Additionally, note made of a prominent feeder vessel extending from the overlying meninges on arterial phase images (series 7, image 38). No definite underlying AVM or other vascular abnormality. Review of the MIP images confirms the above findings IMPRESSION: CT HEAD IMPRESSION: 1. 5.2 x 5.3 x 4.8 cm homogeneous hyperdense mass centered at the left frontal lobe, most characteristic for a meningioma. Associated regional mass effect with up to 7 mm of left-to-right shift. No hydrocephalus or ventricular trapping. Further assessment with dedicated MRI, with and without contrast, recommended for further characterization. 2. Otherwise negative head CT. 3. ASPECTS = 10 CTA HEAD AND NECK IMPRESSION: 1. Negative CTA for emergent large vessel occlusion. No hemodynamically significant stenosis or other acute vascular abnormality identified. 2. Prominent and tortuous vessel within the central aspect of the left frontal lobe mass, most characteristic for an enlarged hypertrophied feeder vessel. No definite underlying AVM or other vascular abnormality. CT VENOGRAM IMPRESSION: Negative CT venogram. No evidence for dural sinus thrombosis or other abnormality. Results were discussed by telephone at the time of interpretation on 07/13/2018 at 10:30 pm with Dr. Samara Snide. Electronically Signed   By: Jeannine Boga M.D.   On: 07/13/2018 23:20   Ct Head Code Stroke Wo Contrast  Result Date: 07/13/2018 CLINICAL DATA:  Initial evaluation for acute altered mental status. EXAM: CT HEAD WITHOUT CONTRAST CT ANGIOGRAPHY HEAD AND NECK CT HEAD VENOGRAM TECHNIQUE: Multidetector CT imaging of the head  and neck was performed using the  standard protocol during bolus administration of intravenous contrast. Multiplanar CT image reconstructions and MIPs were obtained to evaluate the vascular anatomy. Carotid stenosis measurements (when applicable) are obtained utilizing NASCET criteria, using the distal internal carotid diameter as the denominator. CONTRAST:  61mL OMNIPAQUE IOHEXOL 350 MG/ML SOLN COMPARISON:  None available. FINDINGS: CT HEAD FINDINGS Brain: There is a large homogeneous Lea hyperdense lesion positioned at the left frontal lobe that measures 5.2 x 5.3 x 4.8 cm (transverse by AP by craniocaudad), felt to be most consistent with an underlying mass lesion, likely meningioma. Associated mild localized vasogenic edema. Regional mass effect with up to 7 mm of left-to-right shift. Left lateral ventricle is partially effaced. No hydrocephalus or ventricular trapping at this time. Basilar cisterns remain patent. No other mass lesion. No definite acute intracranial hemorrhage. No acute large vessel territory infarct. No extra-axial fluid collection. Vascular: No hyperdense vessel. Skull: Scalp soft tissues demonstrate no acute abnormality. Subtle permeated of change noted at the left frontal calvarium at the level of the left frontal mass (series 4, image 69). Few scattered prominent dural calcifications noted. Sinuses/Orbits: Globes and orbital soft tissues demonstrate no acute finding. Mild scattered mucosal thickening throughout the ethmoidal air cells and maxillary sinuses. Paranasal sinuses are otherwise clear. No mastoid effusion. Middle ear cavities clear. Other: None. ASPECTS ([place] Stroke Program Early CT Score) - Ganglionic level infarction (caudate, lentiform nuclei, internal capsule, insula, M1-M3 cortex): 7 - Supraganglionic infarction (M4-M6 cortex): 3 Total score (0-10 with 10 being normal): 10 CTA NECK FINDINGS Aortic arch: Visualized aortic arch of normal caliber with normal branch pattern. No flow-limiting stenosis about the  origin of the great vessels. Visualized subclavian arteries widely patent. Right carotid system: Right common and internal carotid arteries are widely patent without stenosis, dissection, or occlusion. No atheromatous narrowing about the right carotid bifurcation. Left carotid system: Left common and internal carotid arteries are widely patent without stenosis, dissection or occlusion. No atheromatous narrowing about the left carotid bifurcation. Vertebral arteries: Both vertebral arteries arise from the subclavian arteries. Vertebral arteries widely patent within the neck without stenosis, dissection or occlusion. Skeleton: No acute osseous abnormality. No discrete lytic or blastic osseous lesions. Other neck: No other acute soft tissue abnormality within the neck. Upper chest: Visualized upper chest demonstrates no acute finding. Review of the MIP images confirms the above findings CTA HEAD FINDINGS Anterior circulation: Internal carotid arteries widely patent to the termini without stenosis. A1 segments widely patent. Normal anterior communicating artery complex. Anterior cerebral arteries deviated to the right but widely patent to their distal aspects without stenosis. No M1 stenosis or occlusion. Normal MCA bifurcations. Distal MCA branches well perfused and symmetric. Posterior circulation: Both vertebral arteries widely patent to the vertebrobasilar junction. Left vertebral artery dominant. Posteroinferior cerebral arteries patent bilaterally. Basilar artery widely patent to its distal aspect without stenosis. Superior cerebral arteries patent bilaterally. Right PCA supplied mainly via the basilar. Hypoplastic left P1 with prominent left posterior communicating artery. PCAs well perfused to their distal aspects. Venous sinuses: Major dural sinuses including the superior sagittal sinus, transverse sinuses, and sigmoid sinuses appear patent as do the visualized internal jugular veins. Right transverse sinus  dominant, with hypoplastic left transverse sinus. Straight sinus, vein of Galen, and internal cerebral veins patent. No evidence for dural sinus thrombosis. CT venogram confirms these findings. Anatomic variants: None significant.  No intracranial aneurysm. Delayed phase: Not performed. Left frontal mass lesion does demonstrate some intrinsic enhancement on  delayed venogram images. Additionally, note made of a prominent feeder vessel extending from the overlying meninges on arterial phase images (series 7, image 38). No definite underlying AVM or other vascular abnormality. Review of the MIP images confirms the above findings IMPRESSION: CT HEAD IMPRESSION: 1. 5.2 x 5.3 x 4.8 cm homogeneous hyperdense mass centered at the left frontal lobe, most characteristic for a meningioma. Associated regional mass effect with up to 7 mm of left-to-right shift. No hydrocephalus or ventricular trapping. Further assessment with dedicated MRI, with and without contrast, recommended for further characterization. 2. Otherwise negative head CT. 3. ASPECTS = 10 CTA HEAD AND NECK IMPRESSION: 1. Negative CTA for emergent large vessel occlusion. No hemodynamically significant stenosis or other acute vascular abnormality identified. 2. Prominent and tortuous vessel within the central aspect of the left frontal lobe mass, most characteristic for an enlarged hypertrophied feeder vessel. No definite underlying AVM or other vascular abnormality. CT VENOGRAM IMPRESSION: Negative CT venogram. No evidence for dural sinus thrombosis or other abnormality. Results were discussed by telephone at the time of interpretation on 07/13/2018 at 10:30 pm with Dr. Samara Snide. Electronically Signed   By: Jeannine Boga M.D.   On: 07/13/2018 23:20   Ir Angio Intra Extracran Sel Internal Carotid Uni L Mod Sed  Result Date: 07/19/2018 PROCEDURE: DIAGNOSTIC CEREBRAL ANGIOGRAM ONYX EMBOLIZATION OF LEFT FRONTAL TUMOR HISTORY: The patient is a  38 year old woman initially presenting to the emergency department 10 days postpartum with acute onset of aphasia. Workup included CT scan and MRI which demonstrated a large extra-axial left frontal mass with significant local mass effect and midline shift consistent with meningioma. Given the size and symptomatic nature of this lesion, surgical resection is planned in the next few days. Preoperative angiogram for possible tumor embolization was therefore requested. ACCESS: The technical aspects of the procedure as well as its potential risks and benefits were reviewed with the patient and her husband. These risks included but were not limited to stroke, intracranial hemorrhage, bleeding, infection, allergic reaction, damage to organs or vital structures, stroke, non-diagnostic procedure, and the catastrophic outcomes of heart attack, coma, and death. With an understanding of these risks, informed consent was obtained and witnessed. The patient was placed in the supine position on the angiography table and the skin of right groin prepped in the usual sterile fashion. The procedure was performed under general anesthesia. A 5- French sheath was introduced in the right common femoral artery using Seldinger technique. MEDICATIONS: HEPARIN: 3000 Units total. CONTRAST:  8mL OMNIPAQUE IOHEXOL 300 MG/ML  SOLNcc, Omnipaque 300 FLUOROSCOPY TIME:  FLUOROSCOPY TIME: See IR records TECHNIQUE: CATHETERS AND WIRES 5-French JB-1 catheter 180 cm 0.035" glidewire 5-Fr MPD guide catheter 31mm x 25mm Scepter XC balloon catheter Synchro 2 standard microwire LIQUID EMBOLIC USED Onyx 34 VESSELS CATHETERIZED Left internal carotid Left external carotid Left middle meningeal artery Right common femoral * VESSELS STUDIED Left internal carotid, head Left external carotid, head Left external carotid artery, head (pre-embolization) Left external carotid artery, head (immediate post-embolization) Left external carotid artery, head (final control)  Left external carotid artery, head (final control) Right common femoral PROCEDURAL NARRATIVE A 5-Fr JB-1 glide catheter was advanced over a 0.035 glidewire into the aortic arch. The left internal and external carotid arteries were catheterized and angiogram taken. There was significant blood supply to the tumor from the external carotid artery and we therefore elected to proceed with embolization. The JB 1 glide catheter was removed and the MPD glide catheter was introduced over  the Glidewire. The left external carotid artery was catheterized. The balloon microcatheter was then advanced under roadmap guidance over the synchro 2 standard microwire into the distal left middle meningeal artery just proximal to the tumor itself. The balloon catheter was then flushed with DMSO. The balloon was inflated to occlude the left middle meningeal artery, and embolization of the tumor with Onyx 34 was done over the course of multiple injections using standard blank road map technique. As we identified liquid embolic reflux into a an anastomotic meningeal vessel, this indicated good distal occlusion and we therefore elected to stop the procedure. The balloon was deflated. Immediate post embolization angiogram was taken demonstrating occlusion of the distal left middle meningeal artery and no further tumor blush from the external carotid supply. The balloon catheter was removed without incident. The guide catheter was then removed, and the JB 1 glide catheter was introduced. Final control internal and external carotid artery angiograms were taken. Glide catheter was then removed without incident. FINDINGS: Left internal carotid: head: Injection reveals the presence of a widely patent ICA, A1, and M1 segments and their branches. There is shift of the distal segments of the anterior cerebral artery towards the right. There is tumor blush primarily in the inferior and lateral aspects of the tumor which appears to arise from distal  branches of both the anterior and middle cerebral arteries. The parenchymal and venous phases are normal. No early venous shunting is seen. The venous sinuses are widely patent. Left external carotid, head: The cervical branches of the left external carotid artery are unremarkable. The left middle meningeal artery does appear to be significantly hypertrophic, with a single large pedicle supplying a large portion of the left frontal tumor. There does appear to be a mild amount of venous shunting with slightly early opacification of the midportion of the superior sagittal sinus. Left external carotid (pre-embolization): Injection again demonstrates significant supply to the tumor via a large left middle meningeal artery pedicle. The balloon catheter is position within the distal left middle meningeal artery just proximal to the tumor itself. No pial parenchymal perfusion is identified. Left external carotid (immediate post-embolization): On ax cast is seen within the distal portion of the middle meningeal artery and within the tumor itself. The distal portion of the left middle meningeal artery is occluded. The catheter tip remains in stable position within the distal left middle meningeal artery. Left external carotid (final control): The proximal cervical branches of the external carotid artery are unremarkable. The distal portion of the left middle meningeal artery Mains occluded, with Onyx cast in this segment of the vessel and within the tumor. No brain perfusion is seen. Left internal carotid artery, head (final control): Injection reveals the presence of a widely patent ICA that leads to a patent ACA and MCA. No thrombus is visualized. Blood supply to the inferior and lateral portions of the tumor is unchanged. The parenchymal and venous phases are unremarkable. Right femoral: Normal vessel. No significant atherosclerotic disease. Arterial sheath in adequate position. DISPOSITION: Upon completion of the study,  the femoral sheath was removed and hemostasis obtained using a 5-Fr ExoSeal closure device. Good proximal and distal lower extremity pulses were documented upon achievement of hemostasis. The procedure was well tolerated and no early complications were observed. The patient was transferred to the postanesthesia care unit in stable hemodynamic condition. IMPRESSION: 1. Successful Onyx embolization of the left middle meningeal artery supply to a large left frontal tumor. The tumor continues to perfuse from supply  via the anterior and middle cerebral arteries. The preliminary results of this procedure were shared with the patient and the patient's family. Electronically Signed   By: Consuella Lose   On: 07/19/2018 10:08   Ir Angio External Carotid Sel Ext Carotid Uni L Mod Sed  Result Date: 07/19/2018 PROCEDURE: DIAGNOSTIC CEREBRAL ANGIOGRAM ONYX EMBOLIZATION OF LEFT FRONTAL TUMOR HISTORY: The patient is a 38 year old woman initially presenting to the emergency department 10 days postpartum with acute onset of aphasia. Workup included CT scan and MRI which demonstrated a large extra-axial left frontal mass with significant local mass effect and midline shift consistent with meningioma. Given the size and symptomatic nature of this lesion, surgical resection is planned in the next few days. Preoperative angiogram for possible tumor embolization was therefore requested. ACCESS: The technical aspects of the procedure as well as its potential risks and benefits were reviewed with the patient and her husband. These risks included but were not limited to stroke, intracranial hemorrhage, bleeding, infection, allergic reaction, damage to organs or vital structures, stroke, non-diagnostic procedure, and the catastrophic outcomes of heart attack, coma, and death. With an understanding of these risks, informed consent was obtained and witnessed. The patient was placed in the supine position on the angiography table and  the skin of right groin prepped in the usual sterile fashion. The procedure was performed under general anesthesia. A 5- French sheath was introduced in the right common femoral artery using Seldinger technique. MEDICATIONS: HEPARIN: 3000 Units total. CONTRAST:  83mL OMNIPAQUE IOHEXOL 300 MG/ML  SOLNcc, Omnipaque 300 FLUOROSCOPY TIME:  FLUOROSCOPY TIME: See IR records TECHNIQUE: CATHETERS AND WIRES 5-French JB-1 catheter 180 cm 0.035" glidewire 5-Fr MPD guide catheter 40mm x 6mm Scepter XC balloon catheter Synchro 2 standard microwire LIQUID EMBOLIC USED Onyx 34 VESSELS CATHETERIZED Left internal carotid Left external carotid Left middle meningeal artery Right common femoral * VESSELS STUDIED Left internal carotid, head Left external carotid, head Left external carotid artery, head (pre-embolization) Left external carotid artery, head (immediate post-embolization) Left external carotid artery, head (final control) Left external carotid artery, head (final control) Right common femoral PROCEDURAL NARRATIVE A 5-Fr JB-1 glide catheter was advanced over a 0.035 glidewire into the aortic arch. The left internal and external carotid arteries were catheterized and angiogram taken. There was significant blood supply to the tumor from the external carotid artery and we therefore elected to proceed with embolization. The JB 1 glide catheter was removed and the MPD glide catheter was introduced over the Glidewire. The left external carotid artery was catheterized. The balloon microcatheter was then advanced under roadmap guidance over the synchro 2 standard microwire into the distal left middle meningeal artery just proximal to the tumor itself. The balloon catheter was then flushed with DMSO. The balloon was inflated to occlude the left middle meningeal artery, and embolization of the tumor with Onyx 34 was done over the course of multiple injections using standard blank road map technique. As we identified liquid embolic  reflux into a an anastomotic meningeal vessel, this indicated good distal occlusion and we therefore elected to stop the procedure. The balloon was deflated. Immediate post embolization angiogram was taken demonstrating occlusion of the distal left middle meningeal artery and no further tumor blush from the external carotid supply. The balloon catheter was removed without incident. The guide catheter was then removed, and the JB 1 glide catheter was introduced. Final control internal and external carotid artery angiograms were taken. Glide catheter was then removed without incident. FINDINGS: Left  internal carotid: head: Injection reveals the presence of a widely patent ICA, A1, and M1 segments and their branches. There is shift of the distal segments of the anterior cerebral artery towards the right. There is tumor blush primarily in the inferior and lateral aspects of the tumor which appears to arise from distal branches of both the anterior and middle cerebral arteries. The parenchymal and venous phases are normal. No early venous shunting is seen. The venous sinuses are widely patent. Left external carotid, head: The cervical branches of the left external carotid artery are unremarkable. The left middle meningeal artery does appear to be significantly hypertrophic, with a single large pedicle supplying a large portion of the left frontal tumor. There does appear to be a mild amount of venous shunting with slightly early opacification of the midportion of the superior sagittal sinus. Left external carotid (pre-embolization): Injection again demonstrates significant supply to the tumor via a large left middle meningeal artery pedicle. The balloon catheter is position within the distal left middle meningeal artery just proximal to the tumor itself. No pial parenchymal perfusion is identified. Left external carotid (immediate post-embolization): On ax cast is seen within the distal portion of the middle meningeal  artery and within the tumor itself. The distal portion of the left middle meningeal artery is occluded. The catheter tip remains in stable position within the distal left middle meningeal artery. Left external carotid (final control): The proximal cervical branches of the external carotid artery are unremarkable. The distal portion of the left middle meningeal artery Mains occluded, with Onyx cast in this segment of the vessel and within the tumor. No brain perfusion is seen. Left internal carotid artery, head (final control): Injection reveals the presence of a widely patent ICA that leads to a patent ACA and MCA. No thrombus is visualized. Blood supply to the inferior and lateral portions of the tumor is unchanged. The parenchymal and venous phases are unremarkable. Right femoral: Normal vessel. No significant atherosclerotic disease. Arterial sheath in adequate position. DISPOSITION: Upon completion of the study, the femoral sheath was removed and hemostasis obtained using a 5-Fr ExoSeal closure device. Good proximal and distal lower extremity pulses were documented upon achievement of hemostasis. The procedure was well tolerated and no early complications were observed. The patient was transferred to the postanesthesia care unit in stable hemodynamic condition. IMPRESSION: 1. Successful Onyx embolization of the left middle meningeal artery supply to a large left frontal tumor. The tumor continues to perfuse from supply via the anterior and middle cerebral arteries. The preliminary results of this procedure were shared with the patient and the patient's family. Electronically Signed   By: Consuella Lose   On: 07/19/2018 10:08    Antibiotics:  Anti-infectives (From admission, onward)   Start     Dose/Rate Route Frequency Ordered Stop   07/18/18 2200  ceFAZolin (ANCEF) IVPB 1 g/50 mL premix     1 g 100 mL/hr over 30 Minutes Intravenous Every 8 hours 07/18/18 2030 07/19/18 0655   07/18/18 1828   bacitracin 50,000 Units in sodium chloride 0.9 % 500 mL irrigation  Status:  Discontinued       As needed 07/18/18 1828 07/18/18 1943   07/18/18 1530  ceFAZolin (ANCEF) IVPB 2g/100 mL premix     2 g 200 mL/hr over 30 Minutes Intravenous On call to O.R. 07/18/18 1513 07/18/18 1653   07/18/18 1516  ceFAZolin (ANCEF) 2-4 GM/100ML-% IVPB    Note to Pharmacy: Henrine Screws   : cabinet  override      07/18/18 1516 07/18/18 1653   07/15/18 1402  ceFAZolin (ANCEF) 2-4 GM/100ML-% IVPB    Note to Pharmacy: Luis Abed   : cabinet override      07/15/18 1402 07/16/18 0214      Discharge Exam: Blood pressure 110/76, pulse (!) 55, temperature 98.2 F (36.8 C), temperature source Oral, resp. rate 16, height 5\' 10"  (1.778 m), weight 90.7 kg, SpO2 100 %, unknown if currently breastfeeding. Neurologic: Grossly normal Incision CDI  Discharge Medications:   Allergies as of 07/20/2018   No Known Allergies     Medication List    TAKE these medications   ibuprofen 600 MG tablet Commonly known as: ADVIL Take 1 tablet (600 mg total) by mouth every 6 (six) hours.   levETIRAcetam 500 MG tablet Commonly known as: KEPPRA Take 1 tablet (500 mg total) by mouth 2 (two) times daily.   methylPREDNISolone 4 MG Tbpk tablet Commonly known as: MEDROL DOSEPAK Take as directed       Disposition: home   Final Dx: Craniotomy for BT  Discharge Instructions    Call MD for:  difficulty breathing, headache or visual disturbances   Complete by: As directed    Call MD for:  persistant nausea and vomiting   Complete by: As directed    Call MD for:  redness, tenderness, or signs of infection (pain, swelling, redness, odor or green/yellow discharge around incision site)   Complete by: As directed    Call MD for:  severe uncontrolled pain   Complete by: As directed    Call MD for:  temperature >100.4   Complete by: As directed    Diet - low sodium heart healthy   Complete by: As directed    Increase  activity slowly   Complete by: As directed       Follow-up Information    Eustace Moore, MD. Schedule an appointment as soon as possible for a visit in 2 week(s).   Specialty: Neurosurgery Contact information: [address] [place] [address] [place] [address] [phone]            Signed: Eustace Moore 07/20/2018, 1:00 PM

## 2018-07-20 NOTE — Progress Notes (Signed)
Occupational Therapy Treatment Patient Details Name: April Peterson MRN: [id number] DOB: February 18, 1980 Today's Date: 07/20/2018    History of present illness Patient is a 38 y/o female who presents with speech difficulties and right sided weakness. 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 Pt gave birth 10 days ago. No PMH.   OT comments  Pt is at adequate level for d/c home at this time and not further acute needs. All education is complete and patient indicates understanding.   Follow Up Recommendations  No OT follow up    Equipment Recommendations  None recommended by OT    Recommendations for Other Services      Precautions / Restrictions Precautions Precautions: None       Mobility Bed Mobility Overal bed mobility: Modified Independent                Transfers Overall transfer level: Modified independent                    Balance                                           ADL either performed or assessed with clinical judgement   ADL Overall ADL's : Modified independent                                       General ADL Comments: pt able to locate luggage, bending to gather LB garments, pt advised to sit to don and able to complete, pt demonstrates simulated shower and plans to sit in bath tub upon d/c. pt reports mother and mother in law are caring for babies. pt educated on strategies to help with children such as an activity box for the 2 year hold to allow her to cuddle him but prevent him from climbing on her. activty box when trying to hold the baby to help 4 year feel apart of her return home and special activity.      Vision       Perception     Praxis      Cognition Arousal/Alertness: Awake/alert Behavior During Therapy: WFL for tasks assessed/performed Overall Cognitive Status: Within Functional Limits for tasks assessed                                           Exercises     Shoulder Instructions       General Comments pt reports that breast are starting to reduce and more comfortable at [facility] stime. pt with OBGYN arriving and pt responding with smile and happy to see     Pertinent Vitals/ Pain       Pain Assessment: Faces Faces Pain Scale: Hurts a little bit Pain Location: headache Pain Descriptors / Indicators: Headache Pain Intervention(s): Monitored during session;Repositioned;Patient requesting pain meds-RN notified  Home Living                                          Prior Functioning/Environment              Frequency  Min 2X/week        Progress Toward Goals  OT Goals(current goals can now be found in the care plan section)  Progress towards OT goals: Goals met/education completed, patient discharged from OT  Acute Rehab OT Goals Patient Stated Goal: to get home to my babies OT Goal Formulation: With patient Time For Goal Achievement: 08/02/18 Potential to Achieve Goals: Good ADL Goals Pt Will Perform Tub/Shower Transfer: (met) Additional ADL Goal #1: (met)  Plan Discharge plan remains appropriate    Co-evaluation                 AM-PAC OT "6 Clicks" Daily Activity     Outcome Measure   Help from another person eating meals?: None Help from another person taking care of personal grooming?: None Help from another person toileting, which includes using toliet, bedpan, or urinal?: None Help from another person bathing (including washing, rinsing, drying)?: None Help from another person to put on and taking off regular upper body clothing?: None Help from another person to put on and taking off regular lower body clothing?: None 6 Click Score: 24    End of Session    OT Visit Diagnosis: Muscle weakness (generalized) (M62.81)   Activity Tolerance Patient tolerated treatment well   Patient Left in bed;with call bell/phone within reach   Nurse Communication Mobility  status;Precautions        Time: 2561-5488 OT Time Calculation (min): 13 min  Charges: OT General Charges $OT Visit: 1 Visit OT Treatments $Self Care/Home Management : 8-22 mins   Jeri Modena, OTR/L  Acute Rehabilitation Services Pager: [phone] Office: [phone] .    Jeri Modena 07/20/2018, 10:40 AM

## 2018-07-25 ENCOUNTER — Inpatient Hospital Stay: Payer: BC Managed Care – PPO | Attending: Internal Medicine

## 2019-01-23 DIAGNOSIS — U071 COVID-19: Secondary | ICD-10-CM

## 2019-01-23 HISTORY — DX: COVID-19: U07.1

## 2019-02-01 ENCOUNTER — Other Ambulatory Visit: Payer: Self-pay

## 2019-02-01 ENCOUNTER — Ambulatory Visit: Payer: BC Managed Care – PPO | Admitting: Diagnostic Neuroimaging

## 2019-02-01 DIAGNOSIS — R410 Disorientation, unspecified: Secondary | ICD-10-CM | POA: Diagnosis not present

## 2019-02-01 DIAGNOSIS — R4689 Other symptoms and signs involving appearance and behavior: Secondary | ICD-10-CM

## 2019-02-09 ENCOUNTER — Telehealth: Payer: Self-pay | Admitting: Diagnostic Neuroimaging

## 2019-02-09 NOTE — Procedures (Signed)
   [facility]  EEG (ELECTROENCEPHALOGRAM) REPORT   STUDY DATE: 02/01/19 PATIENT NAME: April Peterson DOB: October 28, 1980 MRN: [id number]  ORDERING CLINICIAN: Birdena Jubilee, NP  TECHNOLOGIST: Babs Bertin TECHNIQUE: Electroencephalogram was recorded utilizing standard 10-20 system of lead placement and reformatted into average and bipolar montages.  RECORDING TIME: 30 minutes ACTIVATION: hyperventilation and photic stimulation  CLINICAL INFORMATION: 39 year old female with history of left frontal meningioma status post resection July 2020.  Evaluate abnormal spell with eye deviation to the right side and confusion.  FINDINGS: Posterior dominant background rhythms, which attenuate with eye opening, ranging 11-12 hertz and 15-20 microvolts.  Rare sharp waves noted over F7, T3 and T5 electrodes.  No phase reversals noted.  No electrographic seizures are seen. Patient recorded in the awake and drowsy state. EKG channel shows regular rhythm of 60-70 beats per minute.   IMPRESSION:   EEG in awake and drowsy states demonstrating: -Rare left frontotemporal sharp waves, F7, T3 and T5 electrodes, may represent epileptiform discharges. -No electrographic seizures are recorded.    INTERPRETING PHYSICIAN:  Penni Bombard, MD Certified in Neurology, Neurophysiology and Neuroimaging  [facility] [address] [place], [place] [address] [phone]

## 2019-02-09 NOTE — Telephone Encounter (Signed)
Pls contact referring NP (K Meyran) re: EEG results. -VRP

## 2019-02-09 NOTE — Telephone Encounter (Signed)
Patient called in to check status of EEG results she states this was a stat referral

## 2019-02-09 NOTE — Telephone Encounter (Signed)
Per Dr Leta Baptist, called referring provider for EEG, Glenford Peers and informed her of EEG results.  I advised I am faxing a copy to her now. She verbalized understanding, appreciation. Faxed EEG report successfully.  Per Dr Leta Baptist called patient, LVM informing her I spoke with ordering NP and gave her results, faxed report to NP. I informed her per Dr Leta Baptist her EEG was very similar to her previous EEG. I informed her our office is closed on Fri, MD on call, open on Monday. Left #.

## 2019-03-20 ENCOUNTER — Ambulatory Visit: Payer: BC Managed Care – PPO | Admitting: Diagnostic Neuroimaging

## 2019-03-20 ENCOUNTER — Encounter: Payer: Self-pay | Admitting: Diagnostic Neuroimaging

## 2019-03-20 ENCOUNTER — Other Ambulatory Visit: Payer: Self-pay

## 2019-03-20 VITALS — BP 133/82 | HR 64 | Temp 97.4°F | Ht 70.5 in | Wt 183.0 lb

## 2019-03-20 DIAGNOSIS — Z9889 Other specified postprocedural states: Secondary | ICD-10-CM

## 2019-03-20 DIAGNOSIS — Z86018 Personal history of other benign neoplasm: Secondary | ICD-10-CM

## 2019-03-20 DIAGNOSIS — G40009 Localization-related (focal) (partial) idiopathic epilepsy and epileptic syndromes with seizures of localized onset, not intractable, without status epilepticus: Secondary | ICD-10-CM | POA: Diagnosis not present

## 2019-03-20 MED ORDER — LAMOTRIGINE 25 MG PO TABS: ORAL_TABLET | ORAL | 3 refills | Status: DC

## 2019-03-20 NOTE — Progress Notes (Signed)
[facility]  PATIENT: April Peterson DOB: 11/20/1980  REFERRING CLINICIAN: Eleonore Chiquito* HISTORY FROM: patient  REASON FOR VISIT: new consult    HISTORICAL  CHIEF COMPLAINT:  Chief Complaint  Patient presents with  . Seizures    rm 7 New Pt "meningioma found after delivery of baby- surgery, put on Keppra but weaned off Keppra due to side effects, had possible seizure 1 month ago"    HISTORY OF PRESENT ILLNESS:   39 year old female here for evaluation of seizure.  July 2020 patient was 10 days postpartum, when all of a sudden she had difficulty talking, confused, mild right-sided weakness.  Patient went to the hospital for evaluation.  She was found to have a large left frontal meningioma with mass-effect.  She underwent treatment with embolization and resection.  Patient was found to have epileptiform discharges in left frontal region and therefore started on levetiracetam 5 mg twice a day.  Patient took antiseizure medication until September but then was weaned off due to side effect.  Patient did well until January 2021.  Patient had episode of eyes twitching to the right side lasting for 1 minute.  No other postictal symptoms.  Patient felt dehydrated that day from slightly increased alcohol use the night before.    REVIEW OF SYSTEMS: Full 14 system review of systems performed and negative with exception of: As per HPI.  ALLERGIES: No Known Allergies  HOME MEDICATIONS: Outpatient Medications Prior to Visit  Medication Sig Dispense Refill  . levETIRAcetam (KEPPRA) 500 MG tablet Take 1 tablet (500 mg total) by mouth 2 (two) times daily. (Patient not taking: Reported on 03/20/2019) 30 tablet 0  . ibuprofen (ADVIL) 600 MG tablet Take 1 tablet (600 mg total) by mouth every 6 (six) hours. (Patient not taking: Reported on 07/13/2018) 30 tablet 0  . methylPREDNISolone (MEDROL DOSEPAK) 4 MG TBPK tablet Take as directed 21 tablet 0   No  facility-administered medications prior to visit.    PAST MEDICAL HISTORY: Past Medical History:  Diagnosis Date  . Medical history non-contributory   . Meningioma ([place])    removed surgically    PAST SURGICAL HISTORY: Past Surgical History:  Procedure Laterality Date  . APPLICATION OF CRANIAL NAVIGATION Left 07/18/2018   Procedure: APPLICATION OF CRANIAL NAVIGATION;  Surgeon: Eustace Moore, MD;  Location: [place];  Service: Neurosurgery;  Laterality: Left;  . CRANIOTOMY Left 07/18/2018   Procedure: LEFT CRANIOTOMY TUMOR EXCISION WITH BRAIN LAB;  Surgeon: Eustace Moore, MD;  Location: [place];  Service: Neurosurgery;  Laterality: Left;  . IR ANGIO EXTERNAL CAROTID SEL EXT CAROTID UNI L MOD SED  07/15/2018  . IR ANGIO INTRA EXTRACRAN SEL INTERNAL CAROTID UNI L MOD SED  07/15/2018  . IR ANGIOGRAM FOLLOW UP STUDY  07/15/2018  . IR NEURO EACH ADD'L AFTER BASIC UNI LEFT (MS)  07/15/2018  . IR TRANSCATH/EMBOLIZ  07/15/2018  . RADIOLOGY WITH ANESTHESIA N/A 07/15/2018   Procedure: IR WITH ANESTHESIA ANGIOGRAM WITH POSSIBLE WITH POSSIBLE TUMOR EMBOLIZATION;  Surgeon: Consuella Lose, MD;  Location: [place];  Service: Radiology;  Laterality: N/A;  . WISDOM TOOTH EXTRACTION      FAMILY HISTORY: Family History  Problem Relation Age of Onset  . Diabetes Father     SOCIAL HISTORY: Social History   Socioeconomic History  . Marital status: Married    Spouse name: Not on file  . Number of children: 2  . Years of education: Not on file  . 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 Smoking status: Never Smoker  . Smokeless tobacco: Never Used  Substance and Sexual Activity  . Alcohol use: No  . Drug use: No  . Sexual activity: Yes    Birth control/protection: None  Other Topics Concern  . Not on file  Social History Narrative   Lives with family       Social Determinants of Health   Financial Resource  Strain: Low Risk   . Difficulty of Paying Living Expenses: Not hard at all  Food Insecurity: No Food Insecurity  . Worried About Charity fundraiser in the Last Year: Never true  . Ran Out of Food in the Last Year: Never true  Transportation Needs: Unknown  . Lack of Transportation (Medical): No  . Lack of Transportation (Non-Medical): Not on file  Physical Activity: Insufficiently Active  . Days of Exercise per Week: 3 days  . Minutes of Exercise per Session: 30 min  Stress: Stress Concern Present  . Feeling of Stress : To some extent  Social Connections:   . Frequency of Communication with Friends and Family:   . Frequency of Social Gatherings with Friends and Family:   . Attends Religious Services:   . Active Member of Clubs or Organizations:   . Attends Archivist Meetings:   Marland Kitchen Marital Status:   Intimate Partner Violence: Not At Risk  . Fear of Current or Ex-Partner: No  . Emotionally Abused: No  . Physically Abused: No  . Sexually Abused: No     PHYSICAL EXAM  GENERAL EXAM/CONSTITUTIONAL: Vitals:  Vitals:   03/20/19 1549  BP: 133/82  Pulse: 64  Temp: (!) 97.4 F (36.3 C)  Weight: 183 lb (83 kg)  Height: 5' 10.5" (1.791 m)     Body mass index is 25.89 kg/m. Wt Readings from Last 3 Encounters:  03/20/19 183 lb (83 kg)  07/15/18 200 lb (90.7 kg)  07/04/18 200 lb (90.7 kg)     Patient is in no distress; well developed, nourished and groomed; neck is supple  CARDIOVASCULAR:  Examination of carotid arteries is normal; no carotid bruits  Regular rate and rhythm, no murmurs  Examination of peripheral vascular system by observation and palpation is normal  EYES:  Ophthalmoscopic exam of optic discs and posterior segments is normal; no papilledema or hemorrhages  No exam data present  MUSCULOSKELETAL:  Gait, strength, tone, movements noted in Neurologic exam below  NEUROLOGIC: MENTAL STATUS:  No flowsheet data found.  awake, alert,  oriented to person, place and time  recent and remote memory intact  normal attention and concentration  language fluent, comprehension intact, naming intact  fund of knowledge appropriate  CRANIAL NERVE:   2nd - no papilledema on fundoscopic exam  2nd, 3rd, 4th, 6th - pupils equal and reactive to light, visual fields full to confrontation, extraocular muscles intact, no nystagmus  5th - facial sensation symmetric  7th - facial strength symmetric  8th - hearing intact  9th - palate elevates symmetrically, uvula midline  11th - shoulder shrug symmetric  12th - tongue protrusion midline  MOTOR:   normal bulk and tone, full strength in the BUE, BLE  SENSORY:   normal and symmetric to light touch, temperature, vibration  COORDINATION:   finger-nose-finger, fine finger movements normal  REFLEXES:   deep tendon reflexes present and symmetric  GAIT/STATION:   narrow based gait; able  to walk on toes, heels and tandem; romberg is negative     DIAGNOSTIC DATA (LABS, IMAGING, TESTING) - I reviewed patient records, labs, notes, testing and imaging myself where available.  Lab Results  Component Value Date   WBC 12.4 (H) 07/13/2018   HGB 14.6 07/13/2018   HCT 43.0 07/13/2018   MCV 92.3 07/13/2018   PLT 286 07/13/2018      Component Value Date/Time   NA 141 07/13/2018 2154   K 4.0 07/13/2018 2154   CL 109 07/13/2018 2154   CO2 22 07/13/2018 2146   GLUCOSE 99 07/13/2018 2154   BUN 17 07/13/2018 2154   CREATININE 0.70 07/13/2018 2154   CALCIUM 9.4 07/13/2018 2146   PROT 6.9 07/13/2018 2146   ALBUMIN 3.4 (L) 07/13/2018 2146   AST 23 07/13/2018 2146   ALT 27 07/13/2018 2146   ALKPHOS 89 07/13/2018 2146   BILITOT 0.8 07/13/2018 2146   GFRNONAA >60 07/13/2018 2146   GFRAA >60 07/13/2018 2146   No results found for: CHOL, HDL, LDLCALC, LDLDIRECT, TRIG, CHOLHDL No results found for: HGBA1C No results found for: VITAMINB12 No results found for:  TSH   07/14/18 MRI brain [I reviewed images myself and agree with interpretation. Mild parenchymal edema noted. -VRP]  1. 5.3 x 5.6 x 5.2 cm extra-axial mass overlying the left frontal convexity, most consistent with meningioma. Associated regional mass effect with up to 7 mm of left-to-right shift.   2. Otherwise normal brain MRI.  07/14/18 EEG Abnormalities:  1) rare left frontotemporal sharp waves 2) evidence of left frontotemporal cerebral dysfunction  02/01/19 EEG in awake and drowsy states demonstrating: -Rare left frontotemporal sharp waves, F7, T3 and T5 electrodes, may represent epileptiform discharges. -No electrographic seizures are recorded.    ASSESSMENT AND PLAN  39 y.o. year old female here with transient language difficulty, right-sided weakness, confusion in the setting of newly diagnosed meningioma in July 2020.  In January 2020 patient had one episode of eyes twitching to the right side which may represent partial seizure.  Follow-up EEG also shows intermittent epileptiform discharges.  Therefore recommend to restart antiseizure medication.  Patient was intolerant of levetiracetam in the past.  We will try lamotrigine.   Dx:  1. Localization-related idiopathic epilepsy and epileptic syndromes with seizures of localized onset, not intractable, without status epilepticus ([place])   2. S/P resection of meningioma     PLAN:  PARTIAL SEIZURE (left frontal localization; due to prior meningioma, s/p resection) - start lamotrigine   Meds ordered this encounter  Medications  . lamoTRIgine (LAMICTAL) 25 MG tablet    Sig: Take 25mg  daily for 2 weeks; then take 25mg  twice a day for 2 weeks; then take 50mg  twice a day    Dispense:  240 tablet    Refill:  3   Return in about 6 months (around 09/20/2019).  I reviewed images, labs, notes, records myself. I summarized findings and reviewed with patient, for this high risk condition (seizure) requiring high complexity decision  making.    Penni Bombard, MD [id number], [id number] PM Certified in Neurology, Neurophysiology and Neuroimaging  [facility] [address] [place], [place] [address] [phone]

## 2019-03-20 NOTE — Patient Instructions (Signed)
PARTIAL SEIZURE (left frontal localization; due to prior meningioma) - start lamotrigine   Meds ordered this encounter  Medications  . lamoTRIgine (LAMICTAL) 25 MG tablet    Sig: Take 25mg  daily for 2 weeks; then take 25mg  twice a day for 2 weeks; then take 50mg  twice a day    Dispense:  240 tablet    Refill:  3

## 2019-05-24 ENCOUNTER — Telehealth: Payer: Self-pay | Admitting: *Deleted

## 2019-05-24 DIAGNOSIS — Z5181 Encounter for therapeutic drug level monitoring: Secondary | ICD-10-CM

## 2019-05-24 DIAGNOSIS — G40009 Localization-related (focal) (partial) idiopathic epilepsy and epileptic syndromes with seizures of localized onset, not intractable, without status epilepticus: Secondary | ICD-10-CM

## 2019-05-24 NOTE — Telephone Encounter (Signed)
Received called from patient stating she's taking lamotrigine two tabs in morning, two tabs at night. She stated she sometimes gets a little dizzy in the day. She wants to know if she can take all 4 tabs at night. I advised will discuss with Dr Leta Baptist and let her know. Patient verbalized understanding, appreciation.

## 2019-05-25 MED ORDER — LAMOTRIGINE ER 100 MG PO TB24: 100.0000 mg | ORAL_TABLET | Freq: Every day | ORAL | 12 refills | Status: DC

## 2019-05-25 NOTE — Telephone Encounter (Signed)
Spoke with patient and informed her of new Rx sent in. She asked if she needs to come in for labs, stated she recalled being told she'd need labs after she started medication. I advised I don't see mention of labs in MD note but will clarify. I will send her my chart.  We scheduled her 6 month FU for Sept.  Patient verbalized understanding, appreciation.

## 2019-05-25 NOTE — Telephone Encounter (Signed)
Ok, will switch to lamotrigine ER 100mg  at bedtime.  Meds ordered this encounter  Medications  . LamoTRIgine 100 MG TB24 24 hour tablet    Sig: Take 1 tablet (100 mg total) by mouth at bedtime.    Dispense:  30 tablet    Refill:  [place], MD [id number], 0000000 AM Certified in Neurology, Neurophysiology and Neuroimaging  [facility] [address] [place],  [address] [phone]

## 2019-05-25 NOTE — Addendum Note (Signed)
Addended by: Minna Antis on: 05/25/2019 04:22 PM   Modules accepted: Orders

## 2019-05-25 NOTE — Telephone Encounter (Signed)
Called patient and informed her Dr Leta Baptist wants her to come in for lab, no appointment needed. Gave her lab hours. She asked if she can't find sitter for her 12 month old, can she bring her. I advised she can. Patient verbalized understanding, appreciation.

## 2019-06-06 ENCOUNTER — Encounter: Payer: Self-pay | Admitting: *Deleted

## 2019-06-14 ENCOUNTER — Encounter: Payer: Self-pay | Admitting: *Deleted

## 2019-09-18 ENCOUNTER — Ambulatory Visit: Payer: Self-pay | Admitting: Diagnostic Neuroimaging

## 2019-11-17 ENCOUNTER — Other Ambulatory Visit: Payer: Self-pay

## 2019-11-17 ENCOUNTER — Other Ambulatory Visit (INDEPENDENT_AMBULATORY_CARE_PROVIDER_SITE_OTHER): Payer: BC Managed Care – PPO

## 2019-11-17 DIAGNOSIS — G40009 Localization-related (focal) (partial) idiopathic epilepsy and epileptic syndromes with seizures of localized onset, not intractable, without status epilepticus: Secondary | ICD-10-CM

## 2019-11-17 DIAGNOSIS — Z0289 Encounter for other administrative examinations: Secondary | ICD-10-CM

## 2019-11-17 DIAGNOSIS — Z5181 Encounter for therapeutic drug level monitoring: Secondary | ICD-10-CM

## 2019-11-18 LAB — COMPREHENSIVE METABOLIC PANEL
ALT: 18 IU/L (ref 0–32)
AST: 21 IU/L (ref 0–40)
Albumin/Globulin Ratio: 2.1 (ref 1.2–2.2)
Albumin: 4.7 g/dL (ref 3.8–4.8)
Alkaline Phosphatase: 76 IU/L (ref 44–121)
BUN/Creatinine Ratio: 22 (ref 9–23)
BUN: 17 mg/dL (ref 6–20)
Bilirubin Total: 0.9 mg/dL (ref 0.0–1.2)
CO2: 22 mmol/L (ref 20–29)
Calcium: 9.5 mg/dL (ref 8.7–10.2)
Chloride: 105 mmol/L (ref 96–106)
Creatinine, Ser: 0.79 mg/dL (ref 0.57–1.00)
GFR calc Af Amer: 111 mL/min/{1.73_m2} (ref >59)
GFR calc non Af Amer: 97 mL/min/{1.73_m2} (ref >59)
Globulin, Total: 2.2 g/dL (ref 1.5–4.5)
Glucose: 81 mg/dL (ref 65–99)
Potassium: 4.6 mmol/L (ref 3.5–5.2)
Sodium: 139 mmol/L (ref 134–144)
Total Protein: 6.9 g/dL (ref 6.0–8.5)

## 2019-11-20 ENCOUNTER — Encounter: Payer: Self-pay | Admitting: *Deleted

## 2019-11-21 ENCOUNTER — Other Ambulatory Visit: Payer: Self-pay

## 2019-11-21 ENCOUNTER — Encounter: Payer: Self-pay | Admitting: Diagnostic Neuroimaging

## 2019-11-21 ENCOUNTER — Ambulatory Visit: Payer: BC Managed Care – PPO | Admitting: Diagnostic Neuroimaging

## 2019-11-21 VITALS — BP 110/72 | HR 55 | Ht 70.0 in | Wt 177.6 lb

## 2019-11-21 DIAGNOSIS — G40009 Localization-related (focal) (partial) idiopathic epilepsy and epileptic syndromes with seizures of localized onset, not intractable, without status epilepticus: Secondary | ICD-10-CM

## 2019-11-21 MED ORDER — LAMOTRIGINE ER 100 MG PO TB24: 100.0000 mg | ORAL_TABLET | Freq: Every day | ORAL | 4 refills | Status: DC

## 2019-11-21 NOTE — Progress Notes (Signed)
April Peterson  PATIENT: April Peterson DOB: 1980-12-09  REFERRING CLINICIAN: Louretta Shorten, MD HISTORY FROM: patient  REASON FOR VISIT: follow up    HISTORICAL  CHIEF COMPLAINT:  Chief Complaint  Patient presents with  . Epilepsy    rm 7, 6 month FU "some dizziness in past month; feel tired all the time"    HISTORY OF PRESENT ILLNESS:   UPDATE (11/21/19, VRP): Since last visit, doing well. No seizures. Tolerating LTG ER 100mg  at bedtime. Some fatigue and intermittent dizziness in AM.   PRIOR HPI: 39 year old female here for evaluation of seizure.  July 2020 patient was 10 days postpartum, when all of a sudden she had difficulty talking, confused, mild right-sided weakness.  Patient went to the hospital for evaluation.  She was found to have a large left frontal meningioma with mass-effect.  She underwent treatment with embolization and resection.  Patient was found to have epileptiform discharges in left frontal region and therefore started on levetiracetam 500 mg twice a day.  Patient took antiseizure medication until September but then was weaned off due to side effect.  Patient did well until January 2021.  Patient had episode of eyes twitching to the right side lasting for 1 minute.  No other postictal symptoms.  Patient felt dehydrated that day from slightly increased alcohol use the night before.    REVIEW OF SYSTEMS: Full 14 system review of systems performed and negative with exception of: As per HPI.  ALLERGIES: Allergies  Allergen Reactions  . Keppra [Levetiracetam]     "dizzy, tired, out of it"    HOME MEDICATIONS: Outpatient Medications Prior to Visit  Medication Sig Dispense Refill  . cyanocobalamin (,VITAMIN B-12,) 1000 MCG/ML injection Inject 1,000 mcg into the muscle once. Dose unknown, take every 14 days    . LamoTRIgine 100 MG TB24 24 hour tablet Take 1 tablet (100 mg total) by mouth at bedtime. 30 tablet 12  . UNABLE TO FIND Med Name:  "IV cocktail with vitamins every two weeks"     No facility-administered medications prior to visit.    PAST MEDICAL HISTORY: Past Medical History:  Diagnosis Date  . Medical history non-contributory   . Meningioma (April Peterson)    removed surgically    PAST SURGICAL HISTORY: Past Surgical History:  Procedure Laterality Date  . APPLICATION OF CRANIAL NAVIGATION Left 07/18/2018   Procedure: APPLICATION OF CRANIAL NAVIGATION;  Surgeon: April Moore, MD;  Location: [place];  Service: Neurosurgery;  Laterality: Left;  . CRANIOTOMY Left 07/18/2018   Procedure: LEFT CRANIOTOMY TUMOR EXCISION WITH BRAIN LAB;  Surgeon: April Moore, MD;  Location: [place];  Service: Neurosurgery;  Laterality: Left;  . IR ANGIO EXTERNAL CAROTID SEL EXT CAROTID UNI L MOD SED  07/15/2018  . IR ANGIO INTRA EXTRACRAN SEL INTERNAL CAROTID UNI L MOD SED  07/15/2018  . IR ANGIOGRAM FOLLOW UP STUDY  07/15/2018  . IR NEURO EACH ADD'L AFTER BASIC UNI LEFT (MS)  07/15/2018  . IR TRANSCATH/EMBOLIZ  07/15/2018  . RADIOLOGY WITH ANESTHESIA N/A 07/15/2018   Procedure: IR WITH ANESTHESIA ANGIOGRAM WITH POSSIBLE WITH POSSIBLE TUMOR EMBOLIZATION;  Surgeon: April Lose, MD;  Location: [place];  Service: Radiology;  Laterality: N/A;  . WISDOM TOOTH EXTRACTION      FAMILY HISTORY: Family History  Problem Relation Age of Onset  . Diabetes Father     SOCIAL HISTORY: Social History   Socioeconomic History  . Marital status: Married    Spouse name: April Peterson  . Number of children: 2  . Years of education: April on Peterson  . 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 Smoking status: Never Smoker  . Smokeless tobacco: Never Used  Vaping Use  . Vaping Use: Never used  Substance and Sexual Activity  . Alcohol use: No  . Drug use: No  . Sexual activity: Yes    Birth control/protection: None  Other Topics Concern  . April on Peterson  Social History  Narrative   Lives with family       Social Determinants of Health   Financial Resource Strain:   . Difficulty of Paying Living Expenses: April on Peterson  Food Insecurity:   . Worried About Charity fundraiser in the Last Year: April on Peterson  . Ran Out of Food in the Last Year: April on Peterson  Transportation Needs:   . Lack of Transportation (Medical): April on Peterson  . Lack of Transportation (Non-Medical): April on Peterson  Physical Activity:   . Days of Exercise per Week: April on Peterson  . Minutes of Exercise per Session: April on Peterson  Stress:   . Feeling of Stress : April on Peterson  Social Connections:   . Frequency of Communication with Friends and Family: April on Peterson  . Frequency of Social Gatherings with Friends and Family: April on Peterson  . Attends Religious Services: April on Peterson  . Active Member of Clubs or Organizations: April on Peterson  . Attends Archivist Meetings: April on Peterson  . Marital Status: April on Peterson  Intimate Partner Violence:   . Fear of Current or Ex-Partner: April on Peterson  . Emotionally Abused: April on Peterson  . Physically Abused: April on Peterson  . Sexually Abused: April on Peterson     PHYSICAL EXAM  GENERAL EXAM/CONSTITUTIONAL: Vitals:  Vitals:   11/21/19 0902  BP: 110/72  Pulse: (!) 55  Weight: 177 lb 9.6 oz (80.6 kg)  Height: 5\' 10"  (1.778 m)   Body mass index is 25.48 kg/m. Wt Readings from Last 3 Encounters:  11/21/19 177 lb 9.6 oz (80.6 kg)  03/20/19 183 lb (83 kg)  07/15/18 200 lb (90.7 kg)    Patient is in no distress; well developed, nourished and groomed; neck is supple  CARDIOVASCULAR:  Examination of carotid arteries is normal; no carotid bruits  Regular rate and rhythm, no murmurs  Examination of peripheral vascular system by observation and palpation is normal  EYES:  Ophthalmoscopic exam of optic discs and posterior segments is normal; no papilledema or hemorrhages No exam data present  MUSCULOSKELETAL:  Gait, strength, tone, movements noted in  Neurologic exam below  NEUROLOGIC: MENTAL STATUS:  No flowsheet data found.  awake, alert, oriented to person, place and time  recent and remote memory intact  normal attention and concentration  language fluent, comprehension intact, naming intact  fund of knowledge appropriate  CRANIAL NERVE:   2nd - no papilledema on fundoscopic exam  2nd, 3rd, 4th, 6th - pupils equal and reactive to light, visual fields full to confrontation, extraocular muscles intact, no nystagmus  5th - facial sensation symmetric  7th - facial strength symmetric  8th - hearing intact  9th - palate elevates symmetrically, uvula midline  11th - shoulder shrug symmetric  12th - tongue protrusion midline  MOTOR:   normal bulk and tone, full strength in the BUE, BLE  SENSORY:   normal and symmetric to light touch, temperature, vibration  COORDINATION:   finger-nose-finger, fine finger movements normal  REFLEXES:   deep tendon reflexes present and symmetric  GAIT/STATION:   narrow based gait     DIAGNOSTIC DATA (LABS, IMAGING, TESTING) - I reviewed patient records, labs, notes, testing and imaging myself where available.  Lab Results  Component Value Date   WBC 12.4 (H) 07/13/2018   HGB 14.6 07/13/2018   HCT 43.0 07/13/2018   MCV 92.3 07/13/2018   PLT 286 07/13/2018      Component Value Date/Time   NA 139 11/17/2019 1017   K 4.6 11/17/2019 1017   CL 105 11/17/2019 1017   CO2 22 11/17/2019 1017   GLUCOSE 81 11/17/2019 1017   GLUCOSE 99 07/13/2018 2154   BUN 17 11/17/2019 1017   CREATININE 0.79 11/17/2019 1017   CALCIUM 9.5 11/17/2019 1017   PROT 6.9 11/17/2019 1017   ALBUMIN 4.7 11/17/2019 1017   AST 21 11/17/2019 1017   ALT 18 11/17/2019 1017   ALKPHOS 76 11/17/2019 1017   BILITOT 0.9 11/17/2019 1017   GFRNONAA 97 11/17/2019 1017   GFRAA 111 11/17/2019 1017   No results found for: CHOL, HDL, LDLCALC, LDLDIRECT, TRIG, CHOLHDL No results found for: HGBA1C No  results found for: VITAMINB12 No results found for: TSH   07/14/18 MRI brain [I reviewed images myself and agree with interpretation. Mild parenchymal edema noted. -VRP]  1. 5.3 x 5.6 x 5.2 cm extra-axial mass overlying the left frontal convexity, most consistent with meningioma. Associated regional mass effect with up to 7 mm of left-to-right shift.   2. Otherwise normal brain MRI.  07/14/18 EEG Abnormalities:  1) rare left frontotemporal sharp waves 2) evidence of left frontotemporal cerebral dysfunction  02/01/19 EEG in awake and drowsy states demonstrating: -Rare left frontotemporal sharp waves, F7, T3 and T5 electrodes, may represent epileptiform discharges. -No electrographic seizures are recorded.    ASSESSMENT AND PLAN  39 y.o. year old female here with transient language difficulty, right-sided weakness, confusion in the setting of newly diagnosed meningioma in July 2020.  In January 2020 patient had one episode of eyes twitching to the right side which may represent partial seizure.  Follow-up EEG also shows intermittent epileptiform discharges.  Patient was intolerant of levetiracetam in the past.    Dx:  1. Localization-related idiopathic epilepsy and epileptic syndromes with seizures of localized onset, April intractable, without status epilepticus ([place])     PLAN:  PARTIAL SEIZURE (left frontal localization; due to prior meningioma, s/p resection) - continue lamotrigine ER 100mg  at bedtime  Meds ordered this encounter  Medications  . LamoTRIgine 100 MG TB24 24 hour tablet    Sig: Take 1 tablet (100 mg total) by mouth at bedtime.    Dispense:  90 tablet    Refill:  4   Return in about 1 year (around 11/20/2020) for with NP (Amy Lomax), virtual visit (15 min).   Penni Bombard, MD 96/04/5407, 8:11 AM Certified in Neurology, Neurophysiology and Neuroimaging  [facility] [address], [place], [place] [address] [phone]

## 2020-06-25 ENCOUNTER — Telehealth: Payer: Self-pay | Admitting: Diagnostic Neuroimaging

## 2020-06-25 NOTE — Telephone Encounter (Signed)
Pt states she has found an online pharmacy and would like to get her LamoTRIgine 100 MG TB24 24 hour tablet thru [facility] they can be called at [phone].  Please call pt

## 2020-06-26 ENCOUNTER — Other Ambulatory Visit: Payer: Self-pay | Admitting: Emergency Medicine

## 2020-06-26 MED ORDER — LAMOTRIGINE ER 100 MG PO TB24: 100.0000 mg | ORAL_TABLET | Freq: Every day | ORAL | 1 refills | Status: DC

## 2020-06-26 NOTE — Telephone Encounter (Signed)
New pharmacy change to [facility].  Called number provided by patient and verified information with pharmacy.    Lamotrigine sent to new pharmacy as requested.

## 2020-07-28 IMAGING — MR MRI HEAD WITHOUT AND WITH CONTRAST
14 of 16 series · 40 of 48 positions shown · IV contrast (Gadavist)
Comparison: Prior CT and CTA from 07/13/2018.

CLINICAL DATA: Follow-up examination for acute stroke, mass on
prior CT.

EXAM:
MRI HEAD WITHOUT AND WITH CONTRAST
TECHNIQUE: Multiplanar, multiecho pulse sequences of the brain and surrounding
structures were obtained without and with intravenous contrast.
CONTRAST:  9 cc of Gadavist.

[Series 5: DWI · axial · 3.0mm · 0.88mm/px · z∈[-66,+72]mm · 5 of 96 slices shown (1 of 4)]
[im 1/96]
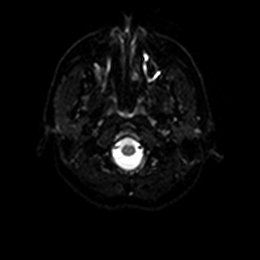
[im 24/96]
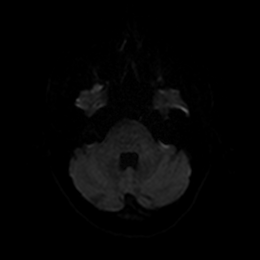
[im 48/96]
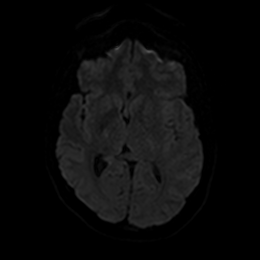
[im 72/96]
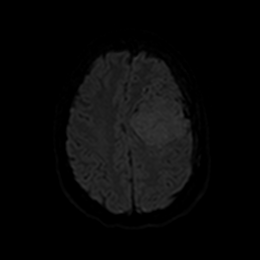
[im 96/96]
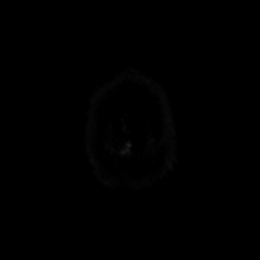

[Series 6: DWI · axial · 3.0mm · 0.88mm/px · z∈[-66,+72]mm · 3 of 48 slices shown (2 of 4)]
[im 1/48]
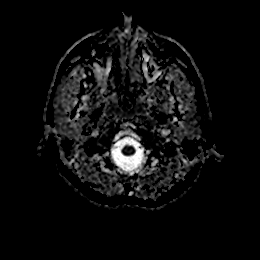
[im 24/48]
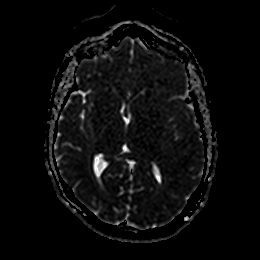
[im 48/48]
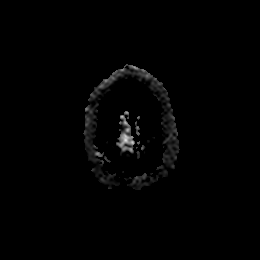

[Series 7: DWI · coronal · 4.0mm · 0.88mm/px · 4 of 70 slices shown (3 of 4)]
[im 1/70]
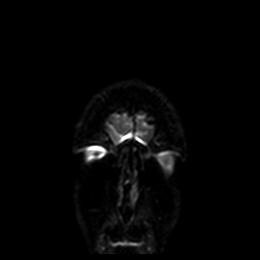
[im 24/70]
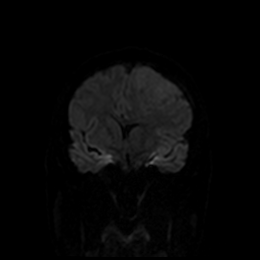
[im 47/70]
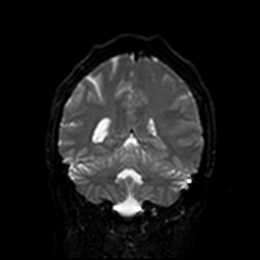
[im 70/70]
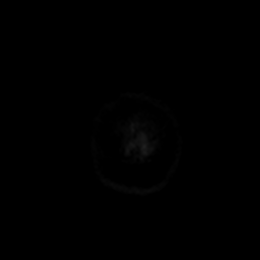

[Series 8: DWI · coronal · 4.0mm · 0.88mm/px · 2 of 35 slices shown (4 of 4)]
[im 1/35]
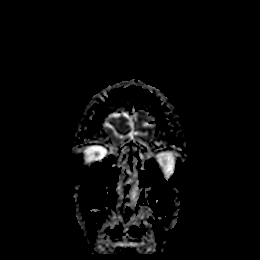
[im 35/35]
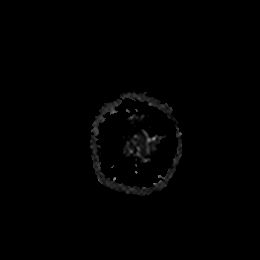

[Series 9: T1 · sagittal · 5.0mm · 0.75mm/px · 1 of 23 slices shown]
[im 1/23]
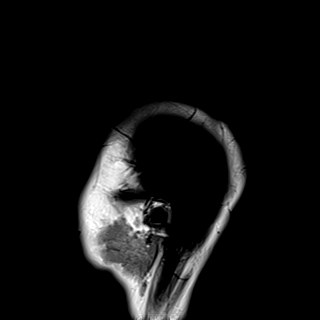

[Series 10: T2 · axial · 5.0mm · 0.72mm/px · z∈[-68,+72]mm · 2 of 25 slices shown]
[im 1/25]
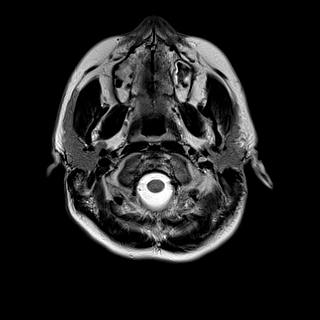
[im 25/25]
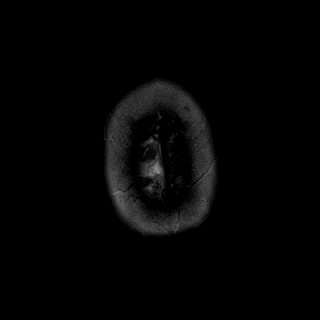

[Series 11: FLAIR · axial · 5.0mm · 0.45mm/px · z∈[-68,+72]mm · 2 of 25 slices shown]
[im 1/25]
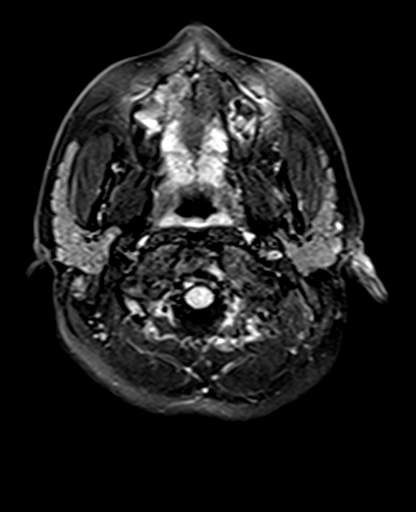
[im 25/25]
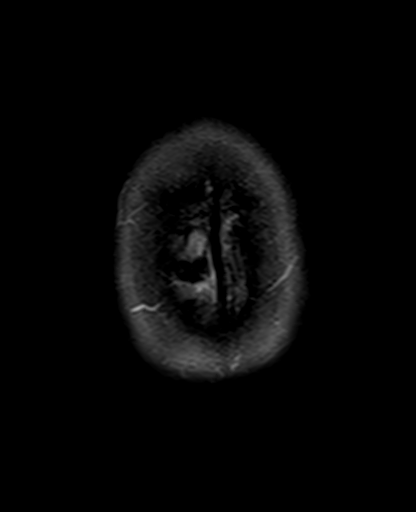

[Series 12: mag_images · axial · 3.0mm · 0.90mm/px · z∈[-86,+99]mm · 4 of 64 slices shown]
[im 1/64]
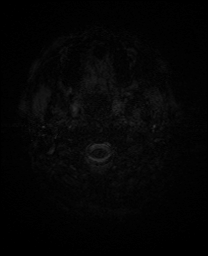
[im 22/64]
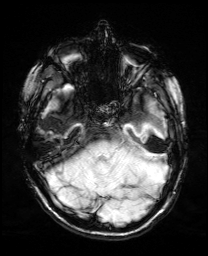
[im 43/64]
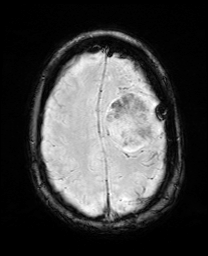
[im 64/64]
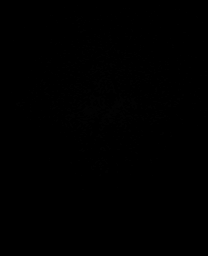

[Series 13: pha_images · axial · 3.0mm · 0.90mm/px · z∈[-86,+87]mm · 4 of 59 slices shown]
[im 1/59]
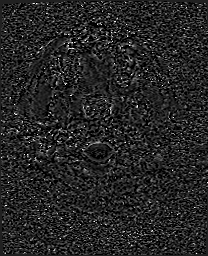
[im 20/59]
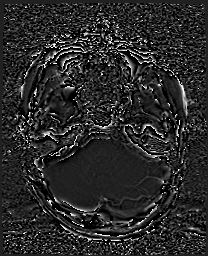
[im 39/59]
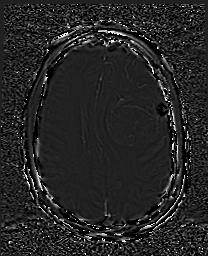
[im 59/59]
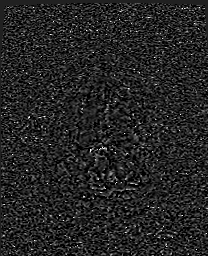

[Series 14: swi_images · axial · 3.0mm · 0.90mm/px · z∈[-86,+99]mm · 4 of 64 slices shown]
[im 1/64]
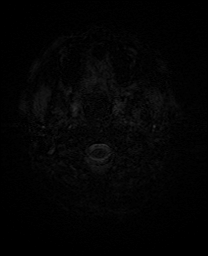
[im 22/64]
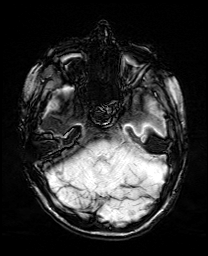
[im 43/64]
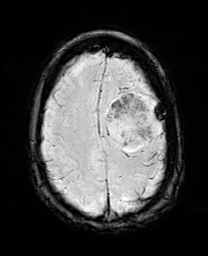
[im 64/64]
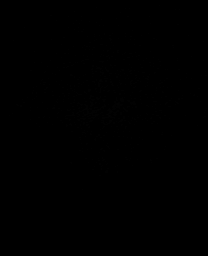

[Series 15: mip_images(sw) · axial · 24.0mm · 0.90mm/px · z∈[-75,+89]mm · 4 of 57 slices shown]
[im 1/57]
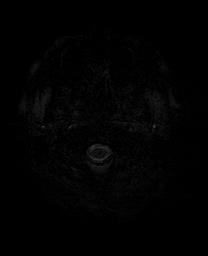
[im 19/57]
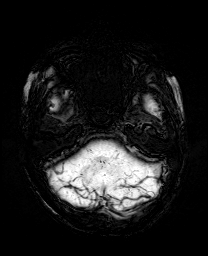
[im 38/57]
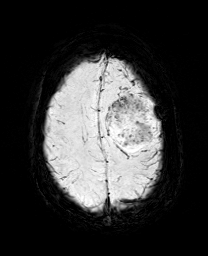
[im 57/57]
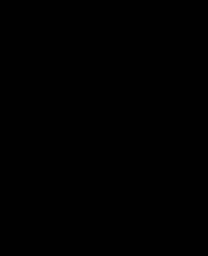

[Series 17: T2 post-contrast · coronal · 5.0mm · 0.72mm/px · 2 of 30 slices shown]
[im 1/30]
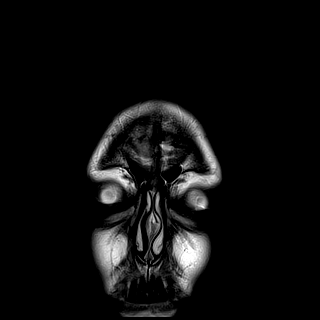
[im 30/30]
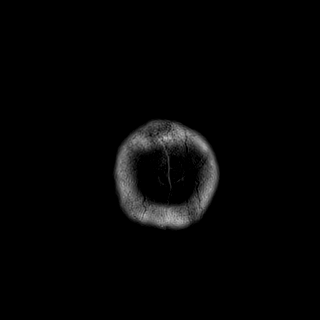

[Series 19: T1 post-contrast · coronal · 5.0mm · 0.34mm/px · 2 of 30 slices shown (1 of 2)]
[im 1/30]
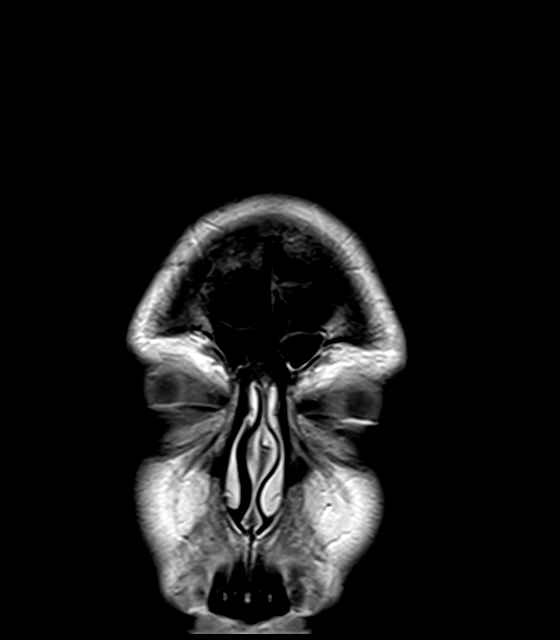
[im 30/30]
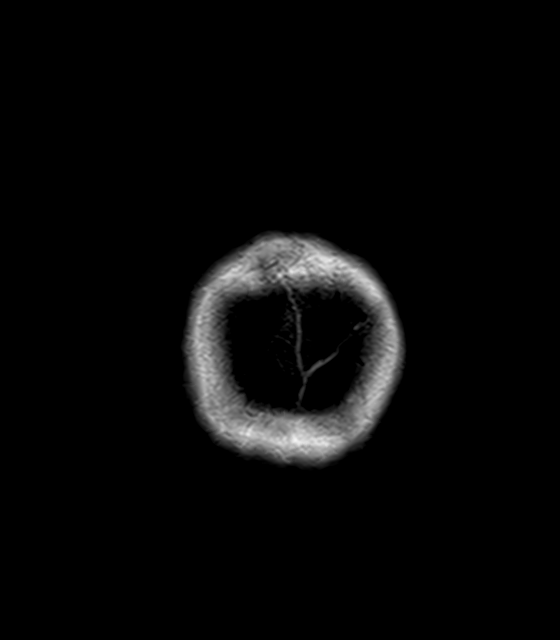

[Series 20: T1 post-contrast · sagittal · 5.0mm · 0.72mm/px · 1 of 23 slices shown (2 of 2)]
[im 1/23]
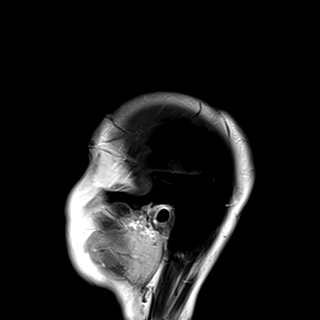

[40 of 48 positions shown; findings below may reference images not displayed]

FINDINGS: Brain: Again seen is a large well-circumscribed mass centered at the
left frontal convexity. Lesion measures 5.3 x 5.6 x 5.2 cm in
maximal dimensions (AP by transverse by craniocaudad). Thin
rim/cleft of CSF seen surrounding the lesion on T2 weighted
sequence, consistent with an extra-axial mass. Lesion demonstrates
hypointense precontrast T1 signal intensity with homogeneous solid
postcontrast enhancement. Finding most consistent with a meningioma.
Prominent flow void seen coursing from the overlying meninges to the
central size deep aspect of the lesion, consistent with a large
arterial feeder vessel (series 10, image 22). Overlying dural
thickening and enhancement. Scattered areas of internal
susceptibility artifact likely reflect blood products. Small amount
of surrounding vasogenic edema within the adjacent frontal lobe.
Regional mass effect with associated 7 mm of left-to-right shift.
Left lateral ventricle partially compressed. No hydrocephalus or
ventricular trapping. Basilar cisterns remain patent.

Remainder the brain is normal in appearance. No evidence for acute
or subacute infarct. Gray-white matter differentiation otherwise
maintained. No areas of remote cortical infarction. No other
evidence for acute or chronic intracranial hemorrhage.

No other mass lesion or abnormal enhancement. Pituitary gland
suprasellar region within normal limits. Midline structures intact.

Vascular: Major intracranial vascular flow voids are maintained.

Skull and upper cervical spine: Craniocervical junction within
normal limits. Upper cervical spine normal. Bone marrow signal
intensity within normal limits. No scalp soft tissue abnormality.
Few scattered dural calcifications again noted.

Sinuses/Orbits: Globes and orbital soft tissues within normal
limits. Paranasal sinuses are clear. No mastoid effusion. Inner ear
structures grossly normal.

Other: None.
IMPRESSION: 1. 5.3 x 5.6 x 5.2 cm extra-axial mass overlying the left frontal
convexity, most consistent with meningioma. Associated regional mass
effect with up to 7 mm of left-to-right shift.
2. Otherwise normal brain MRI.

## 2020-07-28 IMAGING — MR MRI HEAD WITHOUT AND WITH CONTRAST
16 of 17 series · 30 of 48 positions shown · IV contrast (gadavist)
Comparison: Earlier same day

CLINICAL DATA: Preoperative planning for left-sided meningioma.
[REDACTED] study. Ten days postpartum.

EXAM:
MRI HEAD WITHOUT AND WITH CONTRAST
TECHNIQUE: Multiplanar, multiecho pulse sequences of the brain and surrounding
structures were obtained without and with intravenous contrast.
CONTRAST:  9 cc Gadavist

[Series 2: FLAIR · sagittal · 3.0mm · 0.47mm/px · 1 of 38 slices shown]
[im 1/38]
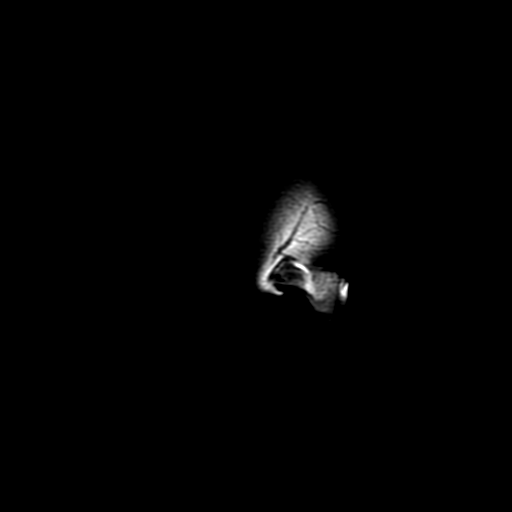

[Series 3: T2 · axial · 5.0mm · 0.43mm/px · 1 of 28 slices shown]
[im 1/28]
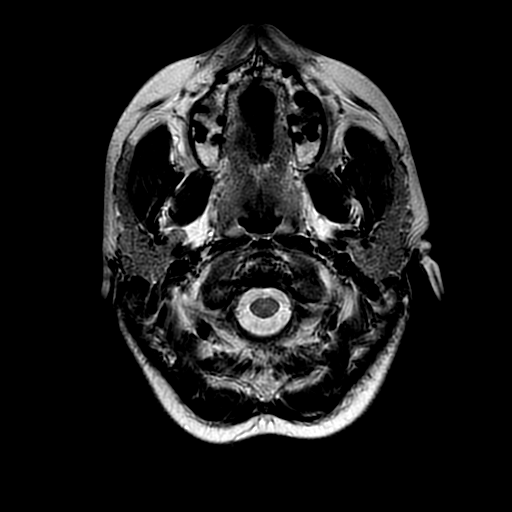

[Series 4: ax dti · axial · 3.0mm · 0.94mm/px · z∈[-35,+127]mm · 8 of 1534 slices shown]
[im 77/1534]
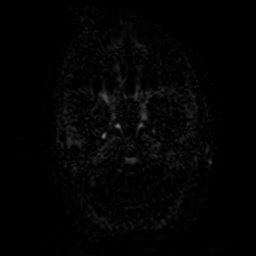
[im 230/1534]
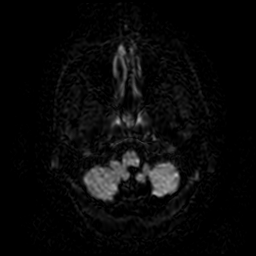
[im 460/1534]
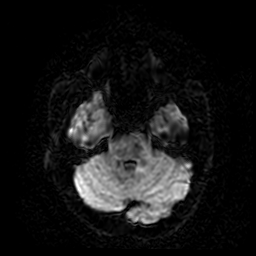
[im 690/1534]
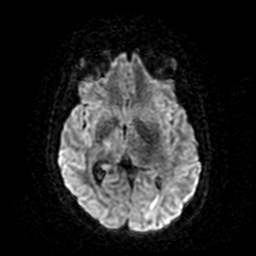
[im 844/1534]
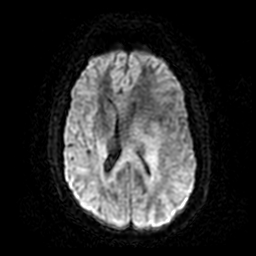
[im 1074/1534]
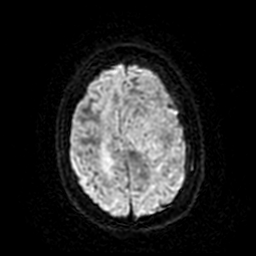
[im 1304/1534]
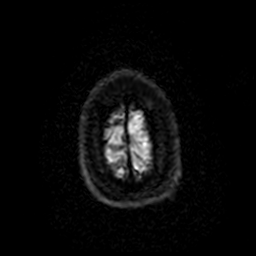
[im 1457/1534]
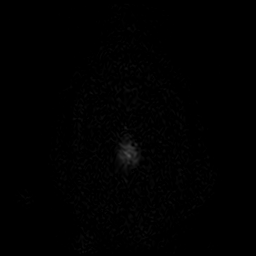

[Series 6: (person_name) · axial · 3.0mm · 0.47mm/px · 1 of 112 slices shown]
[im 1/112]
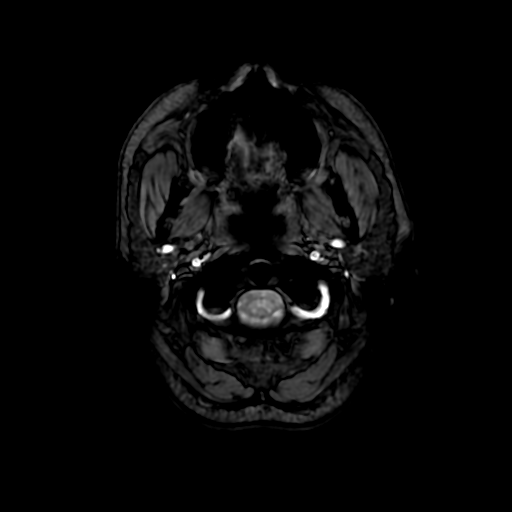

[Series 7: ax 3(person_name) · axial · 1.0mm · 1.02mm/px · z∈[-42,+135]mm · 2 of 178 slices shown (1 of 2)]
[im 1/178]
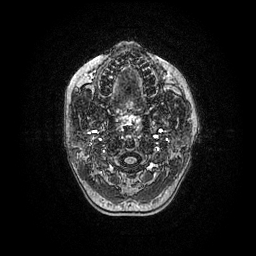
[im 178/178]
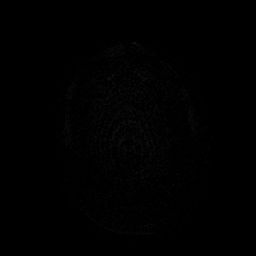

[Series 8: T2 post-contrast · coronal · 3.0mm · 0.39mm/px · 1 of 66 slices shown]
[im 1/66]
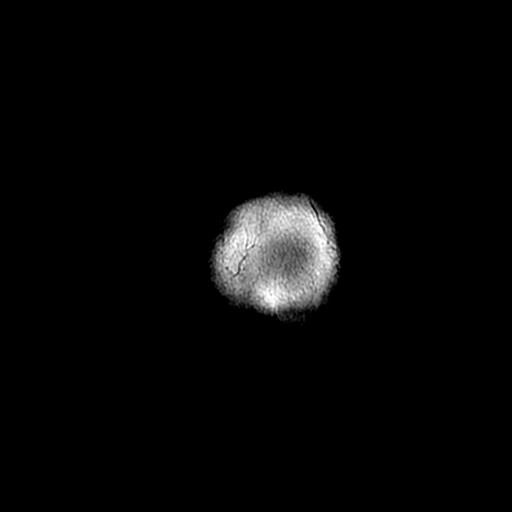

[Series 9: ax 3(person_name) · axial · 1.0mm · 1.02mm/px · z∈[-42,+135]mm · 2 of 178 slices shown (2 of 2)]
[im 1/178]
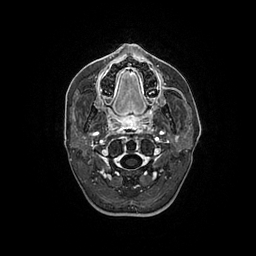
[im 178/178]
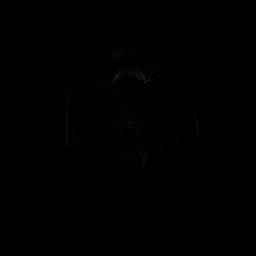

[Series 10: T1 · coronal · 3.0mm · 0.43mm/px · 1 of 48 slices shown]
[im 1/48]
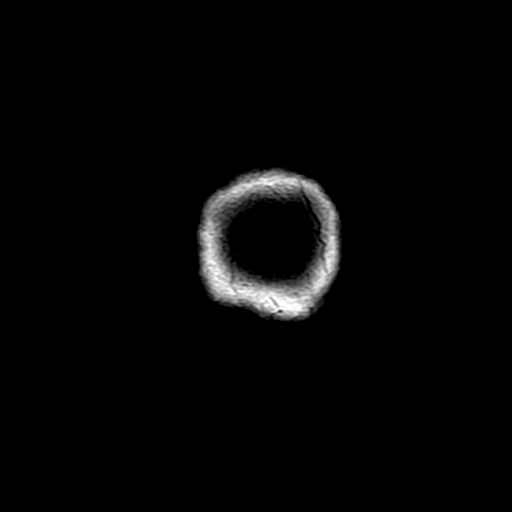

[Series 11: FLAIR post-contrast · sagittal · 3.0mm · 0.47mm/px · 1 of 38 slices shown]
[im 1/38]
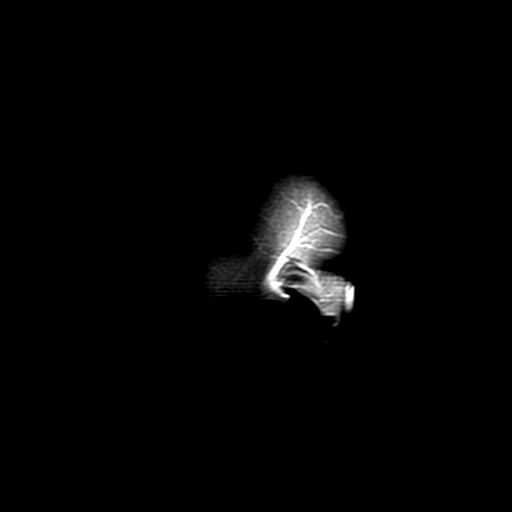

[Series 450: trace:(date) (date) edt · axial · 3.0mm · 0.94mm/px · 1 of 57 slices shown]
[im 1/57]
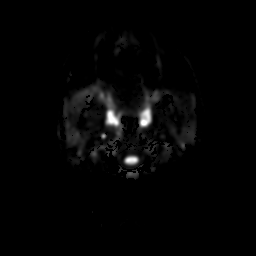

[Series 451: fa(no-q):(date) (date) edt · axial · 3.0mm · 0.94mm/px · 1 of 53 slices shown]
[im 1/53]
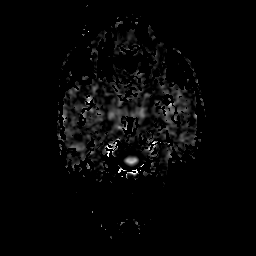

[Series 452: avdc (10^-6 mm²/s)(no-q):(date) · axial · 3.0mm · 0.94mm/px · 1 of 57 slices shown]
[im 1/57]
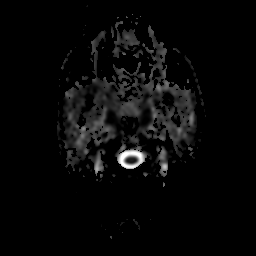

[Series 454: (nocorr):ax dti:(date) (date) · 3.0mm · 1 of 56 slices shown]
[im 1/56]
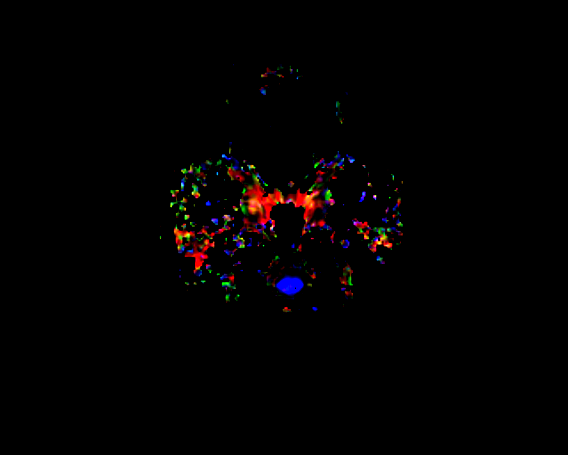

[Series 500: multiplanar reconstruction (mpr) · axial · 1.0mm · 0.50mm/px · z∈[-107,+149]mm · 4 of 257 slices shown (1 of 2)]
[im 1/257]
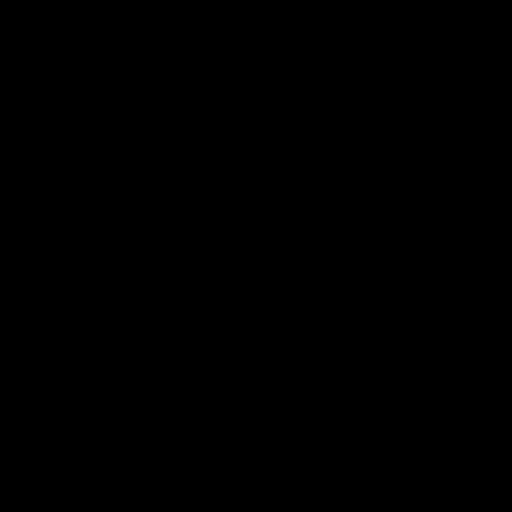
[im 86/257]
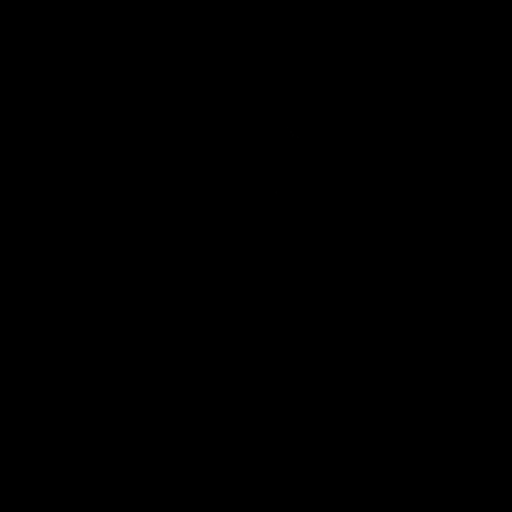
[im 171/257]
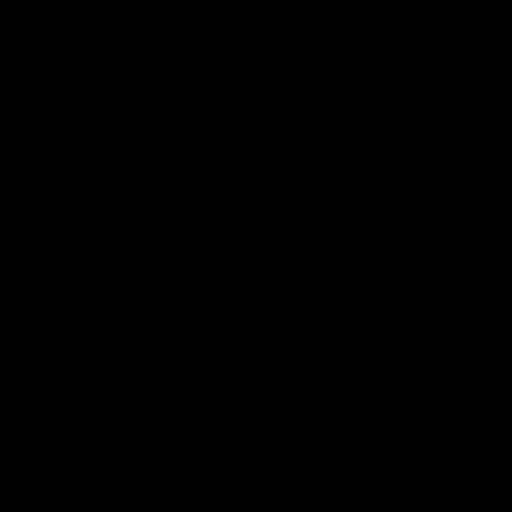
[im 257/257]
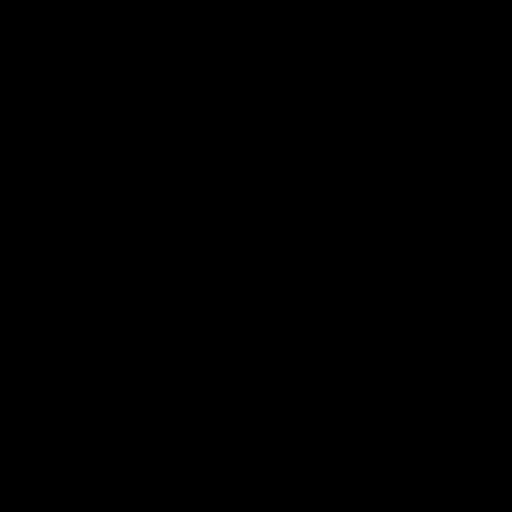

[Series 501: multiplanar reconstruction (mpr) · coronal · 1.0mm · 0.50mm/px · 3 of 245 slices shown (2 of 2)]
[im 1/245]
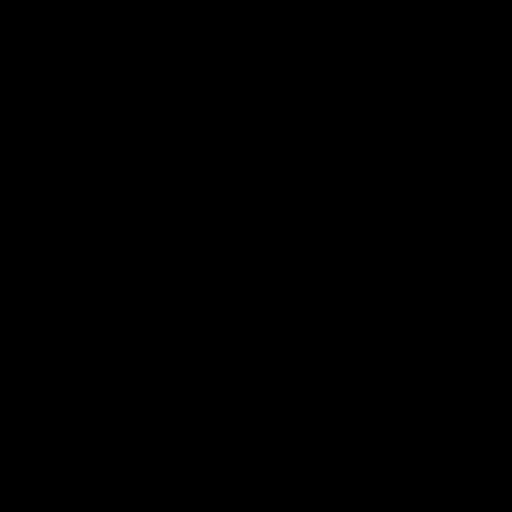
[im 123/245]
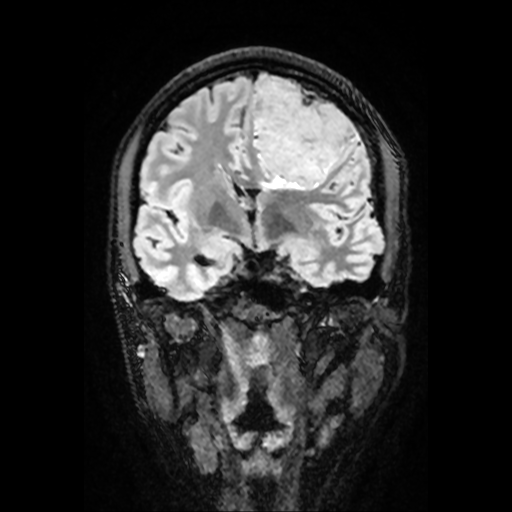
[im 245/245]
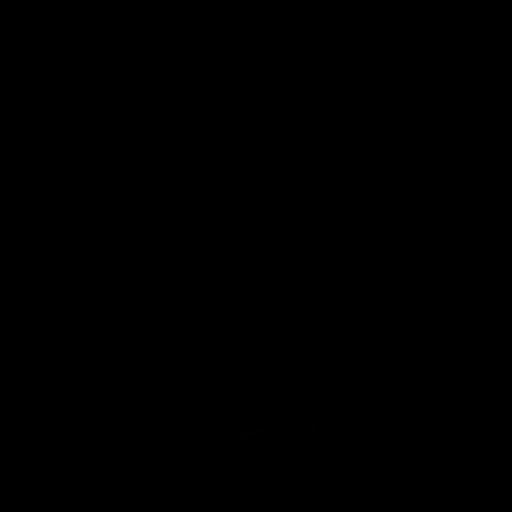

[Series 600: filt_pha: (person_name) · axial · 3.0mm · 0.47mm/px · 1 of 111 slices shown]
[im 1/111]
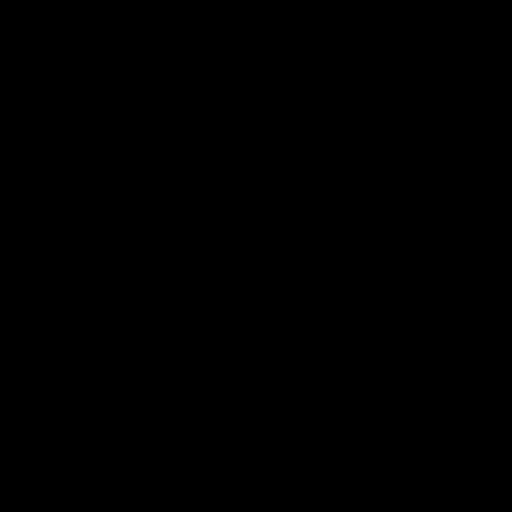

[30 of 48 positions shown; findings below may reference images not displayed]

FINDINGS: Brain: BrainLAB protocol performed for operative guidance. The
examination is obviously stable compared earlier today. 5.3 x 5.6 x
5.2 cm meningioma at the left frontal vertex indenting the brain.
Left-to-right midline brain shift of 7 mm. Mild adjacent vasogenic
edema. See earlier study for full report.
IMPRESSION: BrainLAB study for operative guidance. No change since earlier
today.

## 2020-08-26 ENCOUNTER — Emergency Department (HOSPITAL_BASED_OUTPATIENT_CLINIC_OR_DEPARTMENT_OTHER): Payer: BC Managed Care – PPO

## 2020-08-26 ENCOUNTER — Other Ambulatory Visit: Payer: Self-pay

## 2020-08-26 ENCOUNTER — Encounter (HOSPITAL_BASED_OUTPATIENT_CLINIC_OR_DEPARTMENT_OTHER): Payer: Self-pay

## 2020-08-26 ENCOUNTER — Emergency Department (HOSPITAL_BASED_OUTPATIENT_CLINIC_OR_DEPARTMENT_OTHER)
Admission: EM | Admit: 2020-08-26 | Discharge: 2020-08-26 | Disposition: A | Discharge status: Home / Self Care | Payer: BC Managed Care – PPO | Attending: Emergency Medicine | Admitting: Emergency Medicine

## 2020-08-26 DIAGNOSIS — R109 Unspecified abdominal pain: Secondary | ICD-10-CM | POA: Diagnosis present

## 2020-08-26 DIAGNOSIS — N1330 Unspecified hydronephrosis: Secondary | ICD-10-CM | POA: Diagnosis not present

## 2020-08-26 LAB — URINALYSIS, ROUTINE W REFLEX MICROSCOPIC
Bilirubin Urine: NEGATIVE
Glucose, UA: NEGATIVE mg/dL
Hgb urine dipstick: NEGATIVE
Ketones, ur: NEGATIVE mg/dL
Leukocytes,Ua: NEGATIVE
Nitrite: NEGATIVE
Protein, ur: NEGATIVE mg/dL
Specific Gravity, Urine: 1.009 (ref 1.005–1.030)
pH: 5.5 (ref 5.0–8.0)

## 2020-08-26 LAB — BASIC METABOLIC PANEL
Anion gap: 7 (ref 5–15)
BUN: 12 mg/dL (ref 6–20)
CO2: 24 mmol/L (ref 22–32)
Calcium: 8.8 mg/dL — ABNORMAL LOW (ref 8.9–10.3)
Chloride: 105 mmol/L (ref 98–111)
Creatinine, Ser: 0.79 mg/dL (ref 0.44–1.00)
GFR, Estimated: >60 mL/min (ref >60)
Glucose, Bld: 88 mg/dL (ref 70–99)
Potassium: 4.5 mmol/L (ref 3.5–5.1)
Sodium: 136 mmol/L (ref 135–145)

## 2020-08-26 LAB — CBC
HCT: 37.6 % (ref 36.0–46.0)
Hemoglobin: 12 g/dL (ref 12.0–15.0)
MCH: 27.8 pg (ref 26.0–34.0)
MCHC: 31.9 g/dL (ref 30.0–36.0)
MCV: 87.2 fL (ref 80.0–100.0)
Platelets: 140 10*3/uL — ABNORMAL LOW (ref 150–400)
RBC: 4.31 MIL/uL (ref 3.87–5.11)
RDW: 13.5 % (ref 11.5–15.5)
WBC: 13.1 10*3/uL — ABNORMAL HIGH (ref 4.0–10.5)
nRBC: 0 % (ref 0.0–0.2)

## 2020-08-26 LAB — PREGNANCY, URINE: Preg Test, Ur: NEGATIVE

## 2020-08-26 MED ORDER — TAMSULOSIN HCL 0.4 MG PO CAPS: 0.4000 mg | ORAL_CAPSULE | Freq: Every day | ORAL | 0 refills | Status: DC

## 2020-08-26 MED ORDER — HYDROCODONE-ACETAMINOPHEN 5-325 MG PO TABS: 1.0000 | ORAL_TABLET | Freq: Four times a day (QID) | ORAL | 0 refills | Status: DC | PRN

## 2020-08-26 MED ORDER — KETOROLAC TROMETHAMINE 30 MG/ML IJ SOLN
30.0000 mg | Freq: Once | INTRAMUSCULAR | Status: AC
  Administered 2020-08-26: 30 mg via INTRAVENOUS
  Filled 2020-08-26: qty 1

## 2020-08-26 MED ORDER — KETOROLAC TROMETHAMINE 10 MG PO TABS: 10.0000 mg | ORAL_TABLET | Freq: Four times a day (QID) | ORAL | 0 refills | Status: DC | PRN

## 2020-08-26 NOTE — Discharge Instructions (Addendum)
You were seen in the emergency department for right flank pain.  Your lab work and urinalysis were unremarkable but your CAT scan showed some hydronephrosis or some swelling from a possible blockage in the tube going from the kidney to the bladder.  They did not see an obvious kidney stone.  Dr. Claudia Desanctis from urology was consulted and she recommends treating this like a kidney stone.  Please contact [facility] for close follow-up.  Return to the emergency department for uncontrolled pain or any fever.

## 2020-08-26 NOTE — ED Notes (Signed)
MD at bedside. 

## 2020-08-26 NOTE — ED Provider Notes (Signed)
[facility] EMERGENCY DEPT Provider Note   CSN: [id number] Arrival date & time: 08/26/20  1035     History Chief Complaint  Patient presents with   Flank Pain    April Peterson is a 40 y.o. female.  She has a history of meningioma and resection but otherwise no significant medical history.  She is complaining of 1 week of right flank pain.  Sometimes it radiates around to her abdomen.  No trauma.  No nausea vomiting diarrhea constipation or urinary symptoms.  No chest pain or shortness of breath.  She went to urgent care and they told her she needed a CAT scan and she should come here.  The history is provided by the patient.  Flank Pain This is a new problem. The current episode started more than 1 week ago. The problem occurs constantly. The problem has not changed since onset.Associated symptoms include abdominal pain. Pertinent negatives include no chest pain, no headaches and no shortness of breath. Nothing aggravates the symptoms. Nothing relieves the symptoms. She has tried rest for the symptoms. The treatment provided no relief.      Past Medical History:  Diagnosis Date   Medical history non-contributory    Meningioma [facility])    removed surgically    Patient Active Problem List   Diagnosis Date Noted   S/P craniotomy 07/18/2018   S/P resection of meningioma 07/15/2018   Brain mass 07/14/2018   Pregnant 07/04/2018   Normal labor 12/19/2015    Past Surgical History:  Procedure Laterality Date   APPLICATION OF CRANIAL NAVIGATION Left 07/18/2018   Procedure: APPLICATION OF CRANIAL NAVIGATION;  Surgeon: Eustace Moore, MD;  Location: [facility];  Service: Neurosurgery;  Laterality: Left;   CRANIOTOMY Left 07/18/2018   Procedure: LEFT CRANIOTOMY TUMOR EXCISION WITH BRAIN LAB;  Surgeon: Eustace Moore, MD;  Location: [facility];  Service: Neurosurgery;  Laterality: Left;   IR ANGIO EXTERNAL CAROTID SEL EXT CAROTID UNI L MOD SED  07/15/2018   IR ANGIO INTRA EXTRACRAN  SEL INTERNAL CAROTID UNI L MOD SED  07/15/2018   IR ANGIOGRAM FOLLOW UP STUDY  07/15/2018   IR NEURO EACH ADD'L AFTER BASIC UNI LEFT (MS)  07/15/2018   IR TRANSCATH/EMBOLIZ  07/15/2018   RADIOLOGY WITH ANESTHESIA N/A 07/15/2018   Procedure: IR WITH ANESTHESIA ANGIOGRAM WITH POSSIBLE WITH POSSIBLE TUMOR EMBOLIZATION;  Surgeon: Consuella Lose, MD;  Location: [facility];  Service: Radiology;  Laterality: N/A;   WISDOM TOOTH EXTRACTION       OB History     Gravida  2   Para  2   Term  2   Preterm  0   AB  0   Living  2      SAB  0   IAB  0   Ectopic  0   Multiple  0   Live Births  2           Family History  Problem Relation Age of Onset   Diabetes Father     Social History   Tobacco Use   Smoking status: Never   Smokeless tobacco: Never  Vaping Use   Vaping Use: Never used  Substance Use Topics   Alcohol use: Yes    Comment: couple times per week   Drug use: No    Home Medications Prior to Admission medications   Medication Sig Start Date End Date Taking? Authorizing Provider  cyanocobalamin (,VITAMIN B-12,) 1000 MCG/ML injection Inject 1,000 mcg into the muscle  once. Dose unknown, take every 14 days   Yes [provider]  LamoTRIgine 100 MG TB24 24 hour tablet Take 1 tablet (100 mg total) by mouth at bedtime. 06/26/20  Yes Penumalli, Earlean Polka, MD  UNABLE TO FIND Med Name: "IV cocktail with vitamins every two weeks"    [provider]    Allergies    Keppra [levetiracetam]  Review of Systems   Review of Systems  Constitutional:  Negative for fever.  HENT:  Negative for sore throat.   Eyes:  Negative for visual disturbance.  Respiratory:  Negative for shortness of breath.   Cardiovascular:  Negative for chest pain.  Gastrointestinal:  Positive for abdominal pain.  Genitourinary:  Positive for flank pain. Negative for dysuria.  Musculoskeletal:  Positive for back pain.  Skin:  Negative for rash.  Neurological:  Negative for  headaches.   Physical Exam Updated Vital Signs BP (!) 138/94 (BP Location: Right Arm)   Pulse 96   Temp 98.7 F (37.1 C) (Oral)   Resp 16   Ht '5\' 10"'$  (1.778 m)   Wt 74.8 kg   LMP 08/05/2020   SpO2 100%   BMI 23.68 kg/m   Physical Exam Vitals and nursing note reviewed.  Constitutional:      General: She is not in acute distress.    Appearance: Normal appearance. She is well-developed.  HENT:     Head: Normocephalic and atraumatic.  Eyes:     Conjunctiva/sclera: Conjunctivae normal.  Cardiovascular:     Rate and Rhythm: Normal rate and regular rhythm.     Heart sounds: No murmur heard. Pulmonary:     Effort: Pulmonary effort is normal. No respiratory distress.     Breath sounds: Normal breath sounds.  Abdominal:     Palpations: Abdomen is soft.     Tenderness: There is no abdominal tenderness. There is no right CVA tenderness, left CVA tenderness, guarding or rebound.  Musculoskeletal:        General: No deformity or signs of injury. Normal range of motion.     Cervical back: Neck supple.  Skin:    General: Skin is warm and dry.  Neurological:     General: No focal deficit present.     Mental Status: She is alert.    ED Results / Procedures / Treatments   Labs (all labs ordered are listed, but only abnormal results are displayed) Labs Reviewed  BASIC METABOLIC PANEL - Abnormal; Notable for the following components:      Result Value   Calcium 8.8 (*)    All other components within normal limits  CBC - Abnormal; Notable for the following components:   WBC 13.1 (*)    Platelets 140 (*)    All other components within normal limits  URINALYSIS, ROUTINE W REFLEX MICROSCOPIC - Abnormal; Notable for the following components:   Color, Urine COLORLESS (*)    All other components within normal limits  PREGNANCY, URINE    EKG None  Radiology CT Renal Stone Study  Result Date: 08/26/2020 CLINICAL DATA:  Right flank pain EXAM: CT ABDOMEN AND PELVIS WITHOUT  CONTRAST TECHNIQUE: Multidetector CT imaging of the abdomen and pelvis was performed following the standard protocol without IV contrast. COMPARISON:  None. FINDINGS: Lower chest: The lung bases are clear. The imaged heart is unremarkable. Hepatobiliary: The liver and gallbladder are normal. There is no biliary ductal dilatation. Pancreas: Unremarkable. Spleen: The spleen is enlarged measuring up to 17.2 cm cc. There is no  focal lesion. Adrenals/Urinary Tract: The adrenals are unremarkable. There is moderate right hydronephrosis with dilation of the renal pelvis/proximal collecting system. The distal ureter is decompressed. The left kidney is unremarkable, with no hydronephrosis or hydroureter. No obstructing stone or lesion is identified. The bladder is unremarkable. Stomach/Bowel: The stomach is unremarkable. There is no evidence of bowel obstruction. There is no abnormal bowel wall thickening or inflammatory change. The appendix is normal. Vascular/Lymphatic: The abdominal aorta is nonaneurysmal. There is no abdominal or pelvic lymphadenopathy. Reproductive: The uterus and ovaries are physiologic in appearance. Other: There is a small amount of minimally complex fluid in the pelvis, within physiologic limits. There is no free intraperitoneal air. There is a tiny fat containing umbilical hernia. Musculoskeletal: There is a benign-appearing partially sclerotic lesion in the right iliac bone which may reflect an enchondroma or hemangioma. There is no evidence of acute osseous abnormality. IMPRESSION: Moderate right hydronephrosis and proximal collecting system dilation with no obstructing stone or lesion identified. This may be due to a stricture or ureteral mass. Recommend CT urogram and urology consult. Nuclear scintigraphic Lasix renal scan may also be helpful to assess for functional obstruction. Electronically Signed   By: Valetta Mole M.D.   On: 08/26/2020 13:32    Procedures Procedures   Medications  Ordered in ED Medications  ketorolac (TORADOL) 30 MG/ML injection 30 mg (30 mg Intravenous Given 08/26/20 1357)    ED Course  I have reviewed the triage vital signs and the nursing notes.  Pertinent labs & imaging results that were available during my care of the patient were reviewed by me and considered in my medical decision making (see chart for details).  Clinical Course as of 08/26/20 1736  Mon Aug 26, 2020  1348 Reviewed work-up and imaging with Dr. Claudia Desanctis urology.  She recommended treat this as if it were kidney stone related and treat with pain medicine, Toradol, Flomax and follow-up in the office. [MB]  1420 Toradol with some improvement in her symptoms.  Return instructions discussed. [MB]    Clinical Course User Index [MB] Hayden Rasmussen, MD   MDM Rules/Calculators/A&P                          This patient complains of right flank pain; this involves an extensive number of treatment Options and is a complaint that carries with it a high risk of complications and Morbidity. The differential includes renal colic, pyelonephritis, cholelithiasis, cholecystitis, musculoskeletal  I ordered, reviewed and interpreted labs, which included CBC with a mildly elevated white count, normal hemoglobin, chemistries fairly unremarkable, urinalysis without signs of infection, pregnancy test negative I ordered medication IV Toradol with some improvement in her symptoms I ordered imaging studies which included CT renal and I independently    visualized and interpreted imaging which showed hydroureter on the right with no clear stone Previous records obtained and reviewed in epic, no recent similar visits I consulted Dr. Claudia Desanctis urology and discussed lab and imaging findings  Critical Interventions: None  After the interventions stated above, I reevaluated the patient and found patient's pain to be improved and nontoxic-appearing.  Reviewed urology recommendations with patient and she is  comfortable plan for outpatient follow-up.  Return instructions discussed   Final Clinical Impression(s) / ED Diagnoses Final diagnoses:  Right flank pain  Hydronephrosis, unspecified hydronephrosis type    Rx / DC Orders ED Discharge Orders          Ordered  tamsulosin (FLOMAX) 0.4 MG CAPS capsule  Daily        08/26/20 1424    HYDROcodone-acetaminophen (NORCO/VICODIN) 5-325 MG tablet  Every 6 hours PRN        08/26/20 1424    ketorolac (TORADOL) 10 MG tablet  Every 6 hours PRN        08/26/20 1424             Hayden Rasmussen, MD 08/26/20 [phone]

## 2020-08-26 NOTE — ED Triage Notes (Signed)
Pt reports right flank pain that began last Tuesday.  Denies fever or any urinary symptoms.  Has tried Advil with some relief.

## 2020-08-26 NOTE — ED Notes (Signed)
Patient transported to CT 

## 2020-08-29 ENCOUNTER — Other Ambulatory Visit: Payer: Self-pay | Admitting: Urology

## 2020-08-29 DIAGNOSIS — N130 Hydronephrosis with ureteropelvic junction obstruction: Secondary | ICD-10-CM

## 2020-08-29 DIAGNOSIS — N135 Crossing vessel and stricture of ureter without hydronephrosis: Secondary | ICD-10-CM

## 2020-09-12 ENCOUNTER — Encounter (HOSPITAL_COMMUNITY): Payer: BC Managed Care – PPO

## 2020-09-16 ENCOUNTER — Other Ambulatory Visit: Payer: Self-pay

## 2020-09-16 ENCOUNTER — Encounter (HOSPITAL_COMMUNITY)
Admission: RE | Admit: 2020-09-16 | Discharge: 2020-09-16 | Disposition: A | Discharge status: Home / Self Care | Payer: BC Managed Care – PPO | Source: Ambulatory Visit | Attending: Urology | Admitting: Urology

## 2020-09-16 DIAGNOSIS — N130 Hydronephrosis with ureteropelvic junction obstruction: Secondary | ICD-10-CM

## 2020-09-16 DIAGNOSIS — N135 Crossing vessel and stricture of ureter without hydronephrosis: Secondary | ICD-10-CM | POA: Diagnosis present

## 2020-09-16 MED ORDER — FUROSEMIDE 10 MG/ML IJ SOLN
37.4000 mg | Freq: Once | INTRAMUSCULAR | Status: AC
  Administered 2020-09-16: 37 mg via INTRAVENOUS

## 2020-09-16 MED ORDER — FUROSEMIDE 10 MG/ML IJ SOLN
INTRAMUSCULAR | Status: AC
  Filled 2020-09-16: qty 4

## 2020-09-16 MED ORDER — FUROSEMIDE 10 MG/ML IJ SOLN: 3.7400 mg | Freq: Once | INTRAMUSCULAR | Status: DC

## 2020-09-16 MED ORDER — TECHNETIUM TC 99M MERTIATIDE: 4.9000 | Freq: Once | INTRAVENOUS | Status: DC | PRN

## 2020-10-02 ENCOUNTER — Other Ambulatory Visit: Payer: Self-pay | Admitting: Urology

## 2020-10-03 ENCOUNTER — Encounter (HOSPITAL_BASED_OUTPATIENT_CLINIC_OR_DEPARTMENT_OTHER): Payer: Self-pay | Admitting: Urology

## 2020-10-03 ENCOUNTER — Other Ambulatory Visit: Payer: Self-pay | Admitting: Urology

## 2020-10-03 ENCOUNTER — Other Ambulatory Visit: Payer: Self-pay

## 2020-10-03 DIAGNOSIS — N135 Crossing vessel and stricture of ureter without hydronephrosis: Secondary | ICD-10-CM

## 2020-10-03 HISTORY — DX: Crossing vessel and stricture of ureter without hydronephrosis: N13.5

## 2020-10-03 NOTE — Progress Notes (Signed)
Spoke w/ via phone for pre-op interview---pt Lab needs dos----    urine poct COVID test -----patient states asymptomatic no test needed Arrive at -------1100 am 10-09-2020 NPO after MN NO Solid Food.  Clear liquids from MN until---1000 am Med rec completed Medications to take morning of surgery -----none Diabetic medication -----n/a Patient instructed no nail polish to be worn day of surgery Patient instructed to bring photo id and insurance card day of surgery Patient aware to have Driver (ride ) / caregiver  mother mary   for 24 hours after surgery  Patient Special Instructions -----none Pre-Op special Istructions -----none Patient verbalized understanding of instructions that were given at this phone interview. Patient denies shortness of breath, chest pain, fever, cough at this phone interview.

## 2020-10-09 ENCOUNTER — Ambulatory Visit (HOSPITAL_BASED_OUTPATIENT_CLINIC_OR_DEPARTMENT_OTHER): Payer: BC Managed Care – PPO | Admitting: Anesthesiology

## 2020-10-09 ENCOUNTER — Encounter (HOSPITAL_BASED_OUTPATIENT_CLINIC_OR_DEPARTMENT_OTHER): Payer: Self-pay | Admitting: Urology

## 2020-10-09 ENCOUNTER — Encounter (HOSPITAL_BASED_OUTPATIENT_CLINIC_OR_DEPARTMENT_OTHER)
Admission: RE | Disposition: A | Discharge status: Home / Self Care | Payer: Self-pay | Source: Ambulatory Visit | Attending: Urology

## 2020-10-09 ENCOUNTER — Ambulatory Visit (HOSPITAL_BASED_OUTPATIENT_CLINIC_OR_DEPARTMENT_OTHER)
Admission: RE | Admit: 2020-10-09 | Discharge: 2020-10-09 | Disposition: A | Discharge status: Home / Self Care | Payer: BC Managed Care – PPO | Source: Ambulatory Visit | Attending: Urology | Admitting: Urology

## 2020-10-09 ENCOUNTER — Other Ambulatory Visit: Payer: Self-pay

## 2020-10-09 DIAGNOSIS — N131 Hydronephrosis with ureteral stricture, not elsewhere classified: Secondary | ICD-10-CM | POA: Insufficient documentation

## 2020-10-09 DIAGNOSIS — Z79899 Other long term (current) drug therapy: Secondary | ICD-10-CM | POA: Insufficient documentation

## 2020-10-09 HISTORY — PX: CYSTOSCOPY WITH URETEROSCOPY AND STENT PLACEMENT: SHX6377

## 2020-10-09 HISTORY — DX: Presence of spectacles and contact lenses: Z97.3

## 2020-10-09 LAB — POCT PREGNANCY, URINE: Preg Test, Ur: NEGATIVE

## 2020-10-09 SURGERY — CYSTOURETEROSCOPY, WITH STENT INSERTION
Anesthesia: General | Laterality: Right

## 2020-10-09 MED ORDER — IOHEXOL 300 MG/ML  SOLN
INTRAMUSCULAR | Status: DC | PRN
  Administered 2020-10-09: 10 mL via URETHRAL

## 2020-10-09 MED ORDER — ONDANSETRON HCL 4 MG/2ML IJ SOLN
INTRAMUSCULAR | Status: AC
  Filled 2020-10-09: qty 2

## 2020-10-09 MED ORDER — FENTANYL CITRATE (PF) 100 MCG/2ML IJ SOLN
INTRAMUSCULAR | Status: DC | PRN
  Administered 2020-10-09: 100 ug via INTRAVENOUS

## 2020-10-09 MED ORDER — PROPOFOL 10 MG/ML IV BOLUS
INTRAVENOUS | Status: DC | PRN
  Administered 2020-10-09: 200 mg via INTRAVENOUS

## 2020-10-09 MED ORDER — DEXAMETHASONE SODIUM PHOSPHATE 10 MG/ML IJ SOLN
INTRAMUSCULAR | Status: AC
  Filled 2020-10-09: qty 1

## 2020-10-09 MED ORDER — OXYBUTYNIN CHLORIDE 5 MG PO TABS: 5.0000 mg | ORAL_TABLET | Freq: Three times a day (TID) | ORAL | 1 refills | Status: AC | PRN

## 2020-10-09 MED ORDER — CEPHALEXIN 500 MG PO CAPS: 500.0000 mg | ORAL_CAPSULE | Freq: Two times a day (BID) | ORAL | 0 refills | Status: AC

## 2020-10-09 MED ORDER — TRAMADOL HCL 50 MG PO TABS: 50.0000 mg | ORAL_TABLET | Freq: Four times a day (QID) | ORAL | 0 refills | Status: AC | PRN

## 2020-10-09 MED ORDER — MIDAZOLAM HCL 2 MG/2ML IJ SOLN
INTRAMUSCULAR | Status: AC
  Filled 2020-10-09: qty 2

## 2020-10-09 MED ORDER — SODIUM CHLORIDE 0.9 % IR SOLN
Status: DC | PRN
  Administered 2020-10-09: 3000 mL via INTRAVESICAL

## 2020-10-09 MED ORDER — PROPOFOL 10 MG/ML IV BOLUS
INTRAVENOUS | Status: AC
  Filled 2020-10-09: qty 20

## 2020-10-09 MED ORDER — FENTANYL CITRATE (PF) 100 MCG/2ML IJ SOLN
INTRAMUSCULAR | Status: AC
  Filled 2020-10-09: qty 2

## 2020-10-09 MED ORDER — DEXAMETHASONE SODIUM PHOSPHATE 4 MG/ML IJ SOLN
INTRAMUSCULAR | Status: DC | PRN
  Administered 2020-10-09: 8 mg via INTRAVENOUS

## 2020-10-09 MED ORDER — ONDANSETRON HCL 4 MG/2ML IJ SOLN
INTRAMUSCULAR | Status: DC | PRN
  Administered 2020-10-09: 4 mg via INTRAVENOUS

## 2020-10-09 MED ORDER — CEFAZOLIN SODIUM-DEXTROSE 2-4 GM/100ML-% IV SOLN
2.0000 g | INTRAVENOUS | Status: AC
  Administered 2020-10-09: 2 g via INTRAVENOUS

## 2020-10-09 MED ORDER — KETOROLAC TROMETHAMINE 30 MG/ML IJ SOLN
INTRAMUSCULAR | Status: AC
  Filled 2020-10-09: qty 1

## 2020-10-09 MED ORDER — CEFAZOLIN SODIUM-DEXTROSE 2-4 GM/100ML-% IV SOLN
INTRAVENOUS | Status: AC
  Filled 2020-10-09: qty 100

## 2020-10-09 MED ORDER — LIDOCAINE HCL (CARDIAC) PF 100 MG/5ML IV SOSY
PREFILLED_SYRINGE | INTRAVENOUS | Status: DC | PRN
  Administered 2020-10-09: 100 mg via INTRAVENOUS

## 2020-10-09 MED ORDER — MIDAZOLAM HCL 5 MG/5ML IJ SOLN
INTRAMUSCULAR | Status: DC | PRN
  Administered 2020-10-09: 2 mg via INTRAVENOUS

## 2020-10-09 MED ORDER — LACTATED RINGERS IV SOLN: INTRAVENOUS | Status: DC

## 2020-10-09 MED ORDER — LIDOCAINE 2% (20 MG/ML) 5 ML SYRINGE
INTRAMUSCULAR | Status: AC
  Filled 2020-10-09: qty 5

## 2020-10-09 SURGICAL SUPPLY — 26 items
APL SKNCLS STERI-STRIP NONHPOA (GAUZE/BANDAGES/DRESSINGS)
BAG DRAIN URO-CYSTO SKYTR STRL (DRAIN) ×2 IMPLANT
BAG DRN UROCATH (DRAIN) ×1
BASKET STONE 1.7 NGAGE (UROLOGICAL SUPPLIES) IMPLANT
BASKET ZERO TIP NITINOL 2.4FR (BASKET) ×1 IMPLANT
BENZOIN TINCTURE PRP APPL 2/3 (GAUZE/BANDAGES/DRESSINGS) IMPLANT
BSKT STON RTRVL ZERO TP 2.4FR (BASKET)
CATH URET 5FR 28IN OPEN ENDED (CATHETERS) ×1 IMPLANT
CLOTH BEACON ORANGE TIMEOUT ST (SAFETY) ×2 IMPLANT
FIBER LASER FLEXIVA 365 (UROLOGICAL SUPPLIES) IMPLANT
GLOVE SURG ENC MOIS LTX SZ7.5 (GLOVE) ×2 IMPLANT
GOWN STRL REUS W/TWL XL LVL3 (GOWN DISPOSABLE) ×2 IMPLANT
GUIDEWIRE STR DUAL SENSOR (WIRE) IMPLANT
GUIDEWIRE ZIPWRE .038 STRAIGHT (WIRE) ×2 IMPLANT
IV NS IRRIG 3000ML ARTHROMATIC (IV SOLUTION) ×4 IMPLANT
KIT TURNOVER CYSTO (KITS) ×2 IMPLANT
MANIFOLD NEPTUNE II (INSTRUMENTS) ×2 IMPLANT
NS IRRIG 500ML POUR BTL (IV SOLUTION) ×2 IMPLANT
PACK CYSTO (CUSTOM PROCEDURE TRAY) ×2 IMPLANT
STENT URET 6FRX24 CONTOUR (STENTS) ×1 IMPLANT
STRIP CLOSURE SKIN 1/2X4 (GAUZE/BANDAGES/DRESSINGS) IMPLANT
SYR 10ML LL (SYRINGE) ×2 IMPLANT
TRACTIP FLEXIVA PULS ID 200XHI (Laser) IMPLANT
TRACTIP FLEXIVA PULSE ID 200 (Laser)
TUBE CONNECTING 12X1/4 (SUCTIONS) ×1 IMPLANT
TUBING UROLOGY SET (TUBING) ×2 IMPLANT

## 2020-10-09 NOTE — Anesthesia Postprocedure Evaluation (Signed)
Anesthesia Post Note  Patient: April Peterson  Procedure(s) Performed: CYSTOSCOPY/ RETROGRADE  WITH URETEROSCOPY AND STENT PLACEMENT (Right)     Patient location during evaluation: PACU Anesthesia Type: General Level of consciousness: awake and alert Pain management: pain level controlled Vital Signs Assessment: post-procedure vital signs reviewed and stable Respiratory status: spontaneous breathing, nonlabored ventilation, respiratory function stable and patient connected to nasal cannula oxygen Cardiovascular status: blood pressure returned to baseline and stable Postop Assessment: no apparent nausea or vomiting Anesthetic complications: no   No notable events documented.  Last Vitals:  Vitals:   10/09/20 1120 10/09/20 1400  BP: (!) 147/95 (!) 131/98  Pulse: 72 78  Resp: 15 10  Temp: 37 C 37.1 C  SpO2: 100% 100%    Last Pain:  Vitals:   10/09/20 1120  TempSrc: Oral  PainSc: 0-No pain                 Alika Eppes S

## 2020-10-09 NOTE — Anesthesia Procedure Notes (Signed)
Procedure Name: LMA Insertion Date/Time: 10/09/2020 1:14 PM Performed by: Bufford Spikes, CRNA Pre-anesthesia Checklist: Patient identified, Emergency Drugs available, Suction available and Patient being monitored Patient Re-evaluated:Patient Re-evaluated prior to induction Oxygen Delivery Method: Circle system utilized Preoxygenation: Pre-oxygenation with 100% oxygen Induction Type: IV induction Ventilation: Mask ventilation without difficulty LMA: LMA inserted LMA Size: 4.0 Number of attempts: 1 Airway Equipment and Method: Bite block Placement Confirmation: positive ETCO2 Tube secured with: Tape Dental Injury: Teeth and Oropharynx as per pre-operative assessment

## 2020-10-09 NOTE — Anesthesia Preprocedure Evaluation (Signed)
Anesthesia Evaluation  Patient identified by MRN, date of birth, ID band Patient awake    Reviewed: Allergy & Precautions, NPO status , Patient's Chart, lab work & pertinent test results  Airway Mallampati: II  TM Distance: >3 FB Neck ROM: Full    Dental no notable dental hx.    Pulmonary neg pulmonary ROS,    Pulmonary exam normal breath sounds clear to auscultation       Cardiovascular negative cardio ROS Normal cardiovascular exam Rhythm:Regular Rate:Normal     Neuro/Psych Seizures -, Well Controlled,  negative psych ROS   GI/Hepatic negative GI ROS, Neg liver ROS,   Endo/Other  negative endocrine ROS  Renal/GU negative Renal ROS  negative genitourinary   Musculoskeletal negative musculoskeletal ROS (+)   Abdominal   Peds negative pediatric ROS (+)  Hematology negative hematology ROS (+)   Anesthesia Other Findings   Reproductive/Obstetrics negative OB ROS                             Anesthesia Physical Anesthesia Plan  ASA: 2  Anesthesia Plan: General   Post-op Pain Management:    Induction: Intravenous  PONV Risk Score and Plan: 3 and Ondansetron, Dexamethasone, Treatment may vary due to age or medical condition and Midazolam  Airway Management Planned: LMA  Additional Equipment:   Intra-op Plan:   Post-operative Plan: Extubation in OR  Informed Consent: I have reviewed the patients History and Physical, chart, labs and discussed the procedure including the risks, benefits and alternatives for the proposed anesthesia with the patient or authorized representative who has indicated his/her understanding and acceptance.     Dental advisory given  Plan Discussed with: CRNA and Surgeon  Anesthesia Plan Comments:         Anesthesia Quick Evaluation

## 2020-10-09 NOTE — Discharge Instructions (Addendum)
REMOVE STENT AT 7:00 AM ON MONDAY 10/14/2020.     Post Anesthesia Home Care Instructions  Activity: Get plenty of rest for the remainder of the day. A responsible individual must stay with you for 24 hours following the procedure.  For the next 24 hours, DO NOT: -Drive a car -Paediatric nurse -Drink alcoholic beverages -Take any medication unless instructed by your physician -Make any legal decisions or sign important papers.  Meals: Start with liquid foods such as gelatin or soup. Progress to regular foods as tolerated. Avoid greasy, spicy, heavy foods. If nausea and/or vomiting occur, drink only clear liquids until the nausea and/or vomiting subsides. Call your physician if vomiting continues.  Special Instructions/Symptoms: Your throat may feel dry or sore from the anesthesia or the breathing tube placed in your throat during surgery. If this causes discomfort, gargle with warm salt water. The discomfort should disappear within 24 hours.

## 2020-10-09 NOTE — Op Note (Signed)
Operative Note  Preoperative diagnosis:  1.  Right UPJ obstruction  Postoperative diagnosis: 1.  Right UPJ obstruction with possible high insertion of the right ureter vs crossing vessel  Procedure(s): 1.  Cystoscopy with diagnostic right ureteroscopy and right ureteral stent placement 2.  Right retrograde pyelogram with intraoperative interpretation of fluoroscopic imaging  Surgeon: Ellison Hughs, MD  Assistants:  None  Anesthesia:  General  Complications:  None  EBL: Less than 5 mL  Specimens: 1.  None  Drains/Catheters: 1.  Right 6 French, 24 cm JJ stent with tether  Intraoperative findings:   Right retrograde pyelogram revealed stenosis and tortuosity of the ureter as it inserted into the ureteropelvic junction with marked dilation of the right renal pelvis and its associated calyces.  There were no other filling defects seen within the right renal pelvis or along the remaining length of the right ureter. Ureteroscopically, the right UPJ was tortuous and somewhat difficult to navigate with the flexible ureteroscope, but there was no evidence of intrinsic obstruction or evidence of ureteral scarring/stricture.  Indication:  April Peterson is a 40 y.o. female with severe right-sided hydronephrosis secondary to chronic right UPJ obstruction.  She is here today for diagnostic ureteroscopy prior to undergoing right-sided pyeloplasty.  She has been consented for the above procedures, voices understanding and wishes to proceed.  Description of procedure:  After informed consent was obtained, the patient was brought to the operating room and general LMA anesthesia was administered. The patient was then placed in the dorsolithotomy position and prepped and draped in the usual sterile fashion. A timeout was performed. A 23 French rigid cystoscope was then inserted into the urethral meatus and advanced into the bladder under direct vision. A complete bladder survey revealed no  intravesical pathology.  A 5 French ureteral catheter was then inserted into the right ureteral orifice and a retrograde pyelogram was obtained, with the findings listed above.  A Glidewire was then used to intubate the lumen of the ureteral catheter and was advanced up to the right renal pelvis, under fluoroscopic guidance.  The catheter was then removed, leaving the wire in place.  A flexible ureteroscope was then advanced over the wire and up to the proximal aspects of the right ureter.  The right ureter was found to be tortuous and the flexible scope had to be manipulated in order to navigate the scope into the renal pelvis, which was eventually achieved.  A full inspection of the right renal pelvis revealed no intraluminal lesions or stones.  Retraction of the scope revealed no evidence of an intrinsic compression or evidence of ureteral stricture disease/stenosis.  She was found to have a high insertion of the right ureter fluoroscopically and ureteroscopically.  The flexible ureteroscope was then removed, leaving the wire in place.  A 6 French, 24 cm JJ stent was then placed over the wire and into good position within the right collecting system, confirming placement via fluoroscopy.  The tether the stent was left intact.  The patient's bladder was drained.  She tolerated the procedure well and was transferred to the postanesthesia in stable condition.  Plan: The patient has been instructed to remove her stent, which is on a tether, at 7 AM on 10/14/2020.

## 2020-10-09 NOTE — Transfer of Care (Signed)
Immediate Anesthesia Transfer of Care Note  Patient: April Peterson  Procedure(s) Performed: CYSTOSCOPY/ RETROGRADE  WITH URETEROSCOPY AND STENT PLACEMENT (Right)  Patient Location: PACU  Anesthesia Type:General  Level of Consciousness: awake, alert  and oriented  Airway & Oxygen Therapy: Patient Spontanous Breathing and Patient connected to nasal cannula oxygen  Post-op Assessment: Report given to RN and Post -op Vital signs reviewed and stable  Post vital signs: Reviewed and stable  Last Vitals:  Vitals Value Taken Time  BP    Temp    Pulse 77 10/09/20 1358  Resp 13 10/09/20 1358  SpO2 100 % 10/09/20 1358  Vitals shown include unvalidated device data.  Last Pain:  Vitals:   10/09/20 1120  TempSrc: Oral  PainSc: 0-No pain      Patients Stated Pain Goal: 5 (98/10/25 4862)  Complications: No notable events documented.

## 2020-10-09 NOTE — H&P (Signed)
PRE-OP H&P  Office Visit Report     10/01/2020   --------------------------------------------------------------------------------   April Peterson  MRN: [id number]  DOB: Aug 18, 1980, 39 year old Female  SSN:    PRIMARY CARE:    REFERRING:    PROVIDER:  Wendie Simmer, M.D.  TREATING:  Ellison Hughs, M.D.  LOCATION:  [facility] [phone]     --------------------------------------------------------------------------------   CC/HPI: Right UPJO   April Peterson is a 40 year old female with a right-sided UPJ obstruction likely secondary to lower pole crossing vessels. She was initially seen in consultation by Dr. Gloriann Loan due to ongoing renal colic. Recent MAG3 renogram showed a differential function of 82% on the left and 18% on the right. Today, she states that her right-sided flank pain is since resolved. Looking back, she states that she has been having intermittent episodes of right-sided flank pain since she was young, but never thought anything of it. She denies interval episodes of flank pain. She denies any prior history of UTIs, dysuria, hematuria or prior GU surgery/trauma. No history of kidney stones.     ALLERGIES: No Known Drug Allergies    MEDICATIONS: Tamsulosin Hcl 0.4 mg capsule  Lamotrigine     GU PSH: Locm 300-399Mg /Ml Iodine,1Ml - 09/18/2020     NON-GU PSH: Brain surgery, Tumor     GU PMH: Hydronephrosis - 09/18/2020, - 08/27/2020 Ureteral stricture - 08/27/2020    NON-GU PMH: No Non-GU PMH    FAMILY HISTORY: 1 Daughter - Other 1 son - Other   SOCIAL HISTORY: Marital Status: Married Preferred Language: English; Race: White Current Smoking Status: Patient has never smoked.   Tobacco Use Assessment Completed: Used Tobacco in last 30 days? Does not drink caffeine.    REVIEW OF SYSTEMS:    GU Review Female:   Patient denies frequent urination, hard to postpone urination, burning /pain with urination, get up at night to urinate,  leakage of urine, stream starts and stops, trouble starting your stream, have to strain to urinate, and being pregnant.  Gastrointestinal (Upper):   Patient denies nausea, vomiting, and indigestion/ heartburn.  Gastrointestinal (Lower):   Patient denies diarrhea and constipation.  Constitutional:   Patient denies fever, night sweats, weight loss, and fatigue.  Skin:   Patient denies skin rash/ lesion and itching.  Eyes:   Patient denies double vision and blurred vision.  Ears/ Nose/ Throat:   Patient denies sore throat and sinus problems.  Hematologic/Lymphatic:   Patient denies swollen glands and easy bruising.  Cardiovascular:   Patient denies leg swelling and chest pains.  Respiratory:   Patient denies cough and shortness of breath.  Endocrine:   Patient denies excessive thirst.  Musculoskeletal:   Patient denies back pain and joint pain.  Neurological:   Patient denies headaches and dizziness.  Psychologic:   Patient denies depression and anxiety.   VITAL SIGNS:      10/01/2020 11:54 AM  Weight 165 lb / 74.84 kg  Height 71 in / 180.34 cm  BP 124/79 mmHg  Pulse 81 /min  BMI 23.0 kg/m   MULTI-SYSTEM PHYSICAL EXAMINATION:    Constitutional: Well-nourished. No physical deformities. Normally developed. Good grooming.  Respiratory: No labored breathing, no use of accessory muscles.   Cardiovascular: Normal temperature, normal extremity pulses, no swelling, no varicosities.  Neurologic / Psychiatric: Oriented to time, oriented to place, oriented to person. No depression, no anxiety, no agitation.  Gastrointestinal: No mass, no tenderness, no rigidity, non obese abdomen.  Musculoskeletal: Normal gait and  station of head and neck.     Complexity of Data:  Records Review:   Previous Patient Records  X-Ray Review: C.T. Abdomen/Pelvis: Reviewed Films. Reviewed Report. Discussed With Patient. CLINICAL DATA: Right flank pain EXAM: CT ABDOMEN AND PELVIS WITHOUT CONTRAST TECHNIQUE:  Multidetector CT imaging of the abdomen and pelvis was performed following the standard protocol without IV contrast. COMPARISON: None. FINDINGS: Lower chest: The lung bases are clear. The imaged heart is unremarkable. Hepatobiliary: The liver and gallbladder are normal. There is no biliary ductal dilatation. Pancreas: Unremarkable. Spleen: The spleen is enlarged measuring up to 17.2 cm cc. There is no focal lesion. Adrenals/Urinary Tract: The adrenals are unremarkable. There is moderate right hydronephrosis with dilation of the renal pelvis/proximal collecting system. The distal ureter is decompressed. The left kidney is unremarkable, with no hydronephrosis or hydroureter. No obstructing stone or lesion is identified. The bladder is unremarkable. Stomach/Bowel: The stomach is unremarkable. There is no evidence of bowel obstruction. There is no abnormal bowel wall thickening or inflammatory change. The appendix is normal. Vascular/Lymphatic: The abdominal aorta is nonaneurysmal. There is no abdominal or pelvic lymphadenopathy. Reproductive: The uterus and ovaries are physiologic in appearance. Other: There is a small amount of minimally complex fluid in the pelvis, within physiologic limits. There is no free intraperitoneal air. There is a tiny fat containing umbilical hernia. Musculoskeletal: There is a benign-appearing partially sclerotic lesion in the right iliac bone which may reflect an enchondroma or hemangioma. There is no evidence of acute osseous abnormality. IMPRESSION: Moderate right hydronephrosis and proximal collecting system dilation with no obstructing stone or lesion identified. This may be due to a stricture or ureteral mass. Recommend CT urogram and urology consult. Nuclear scintigraphic Lasix renal scan may also be helpful to assess for functional obstruction. Electronically Signed By: Valetta Mole M.D. On: 08/26/2020 13:32 Lasix Renogram: Reviewed Films. Reviewed Report. Discussed With Patient.     Notes:                     CLINICAL DATA: RIGHT hydronephrosis   EXAM:  NUCLEAR MEDICINE RENAL SCAN WITH DIURETIC ADMINISTRATION   TECHNIQUE:  Radionuclide angiographic and sequential renal images were obtained  after intravenous injection of radiopharmaceutical. Imaging was  continued during slow intravenous injection of Lasix approximately  15 minutes after the start of the examination.   RADIOPHARMACEUTICALS: 4.9 mCi Technetium-37m MAG3 IV   COMPARISON: CT 08/26/2020   FINDINGS:  Flow: Prompt arterial flow to the LEFT kidney. Delayed arterial flow  to the RIGHT kidney..   Left renogram: Uniform uptake of counts in the renal cortex. Counts  are promptly excreted into the collecting system and cleared prior  to administration of Lasix. Lasix augment clearance. No postvoid  residual.   Right renogram: Delayed cortical uptake within the RIGHT renal  parenchyma. Faint uptake noted in the parenchyma with counts  excreted into the RIGHT renal pelvis which is patulous. No clear  clearance of radiotracer from the RIGHT renal pelvis. Postvoid  residual on the RIGHT.   Differential:   Left kidney = 82 %   Right kidney = 18 %   T1/2 post Lasix :   Left kidney = (counts clear near completely prior to administration  of Lasix)   Right kidney = no measurable excretion.   IMPRESSION:  1. Severe obstructive hydronephrosis of the RIGHT kidney. Proximal  obstruction at the RIGHT ureteropelvic junction.  2. Asymmetric renal function as above.    Electronically Signed  By: Nicole Kindred  Leonia Reeves M.D.  On: 09/17/2020 17:04   PROCEDURES: None   ASSESSMENT:      ICD-10 Details  1 GU:   Hydronephrosis - N13.0 Right, Chronic, Stable  2   Ureteral stricture - N13.5 Right, Chronic, Stable   PLAN:           Schedule Return Visit/Planned Activity: Next Available Appointment - Schedule Surgery          Document Letter(s):  Created for Patient: Clinical Summary         Notes:    The risks, benefits and alternatives of cystoscopy with RIGHT ureteroscopy and ureteral stent placement was discussed the patient. Risks included, but are not limited to: bleeding, urinary tract infection, ureteral injury/avulsion, ureteral stricture formation, MI, stroke, PE and the inherent risks of general anesthesia. The patient voices understanding and wishes to proceed.   -The risks, benefits and alternatives of RIGHT robot-assisted pyeloplasty was discussed with the patient. Risks include, but are not limited to, bleeding, infection, recurrence of the UPJ obstruction, ureteral stricture disease, urine leak, chronic pain, nephrectomy, multiple surgeries to correct her UPJ obstruction and the need for an indwelling stent, MI, CVA, PE, DVT and in the inherent risks of general anesthesia. She voices understanding and wishes to proceed.

## 2020-10-10 ENCOUNTER — Encounter (HOSPITAL_BASED_OUTPATIENT_CLINIC_OR_DEPARTMENT_OTHER): Payer: Self-pay | Admitting: Urology

## 2020-10-16 ENCOUNTER — Other Ambulatory Visit (HOSPITAL_COMMUNITY): Payer: Self-pay | Admitting: *Deleted

## 2020-10-17 ENCOUNTER — Other Ambulatory Visit: Payer: Self-pay

## 2020-10-17 ENCOUNTER — Encounter (HOSPITAL_COMMUNITY): Payer: Self-pay

## 2020-10-17 ENCOUNTER — Encounter (HOSPITAL_COMMUNITY)
Admission: RE | Admit: 2020-10-17 | Discharge: 2020-10-17 | Disposition: A | Discharge status: Home / Self Care | Payer: BC Managed Care – PPO | Source: Ambulatory Visit | Attending: Urology | Admitting: Urology

## 2020-10-17 DIAGNOSIS — Z01818 Encounter for other preprocedural examination: Secondary | ICD-10-CM | POA: Diagnosis not present

## 2020-10-17 LAB — CBC
HCT: 36.6 % (ref 36.0–46.0)
Hemoglobin: 12.2 g/dL (ref 12.0–15.0)
MCH: 29.2 pg (ref 26.0–34.0)
MCHC: 33.3 g/dL (ref 30.0–36.0)
MCV: 87.6 fL (ref 80.0–100.0)
Platelets: 289 10*3/uL (ref 150–400)
RBC: 4.18 MIL/uL (ref 3.87–5.11)
RDW: 13.7 % (ref 11.5–15.5)
WBC: 6.3 10*3/uL (ref 4.0–10.5)
nRBC: 0 % (ref 0.0–0.2)

## 2020-10-17 LAB — TYPE AND SCREEN
ABO/RH(D): A POS
Antibody Screen: NEGATIVE

## 2020-10-17 NOTE — Progress Notes (Signed)
COVID Vaccine Completed: Yes Date COVID Vaccine completed: 2021. X 2 COVID vaccine manufacturer: [place] Test: 10/28/20  PCP - No PCP Cardiologist -   Chest x-ray -  EKG -  Stress Test -  ECHO -  Cardiac Cath -  Pacemaker/ICD device last checked:  Sleep Study -  CPAP -   Fasting Blood Sugar -  Checks Blood Sugar _____ times a day  Blood Thinner Instructions: Aspirin Instructions: Last Dose:  Anesthesia review:   Patient denies shortness of breath, fever, cough and chest pain at PAT appointment   Patient verbalized understanding of instructions that were given to them at the PAT appointment. Patient was also instructed that they will need to review over the PAT instructions again at home before surgery.

## 2020-10-17 NOTE — Patient Instructions (Signed)
DUE TO COVID-19 ONLY ONE VISITOR IS ALLOWED TO COME WITH YOU AND STAY IN THE WAITING ROOM ONLY DURING PRE OP AND PROCEDURE.   **NO VISITORS ARE ALLOWED IN THE SHORT STAY AREA OR RECOVERY ROOM!!**  IF YOU WILL BE ADMITTED INTO THE HOSPITAL YOU ARE ALLOWED ONLY TWO SUPPORT PEOPLE DURING VISITATION HOURS ONLY (10AM -8PM)   The support person(s) may change daily. The support person(s) must pass our screening, gel in and out, and wear a mask at all times, including in the patient's room. Patients must also wear a mask when staff or their support person are in the room.  No visitors under the age of 1. Any visitor under the age of 73 must be accompanied by an adult.    COVID SWAB TESTING MUST BE COMPLETED ON:  10/28/20 **MUST PRESENT COMPLETED FORM AT TESTING SITE**    [facility] (backside of the building) You are not required to quarantine, however you are required to wear a well-fitted mask when you are out and around people not in your household.  Hand Hygiene often Do NOT share personal items Notify your provider if you are in close contact with someone who has COVID or you develop fever 100.4 or greater, new onset of sneezing, cough, sore throat, shortness of breath or body aches.  [address], [address], must go inside of the hospital, NOT A DRIVE THRU!  (Must self quarantine after testing. Follow instructions on handout.)       Your procedure is scheduled on: 10/30/20   Report to [facility] Main Entrance    Report to admitting at : 10:45 AM   Call this number if you have problems the morning of surgery [phone]   Do not eat food :After Midnight.   May have liquids until: 10:00 AM   day of surgery  CLEAR LIQUID DIET  Foods Allowed                                                                     Foods Excluded  Water, Black Coffee and tea, regular and decaf                              liquids that you cannot  Plain Jell-O in any flavor  (No red)                                           see through such as: Fruit ices (not with fruit pulp)                                     milk, soups, orange juice              Iced Popsicles (No red)  All solid food                                   Apple juices Sports drinks like Gatorade (No red) Lightly seasoned clear broth or consume(fat free) Sugar,   Sample Menu Breakfast                                Lunch                                     Supper Cranberry juice                    Beef broth                            Chicken broth Jell-O                                     Grape juice                           Apple juice Coffee or tea                        Jell-O                                      Popsicle                                                Coffee or tea                        Coffee or tea     Oral Hygiene is also important to reduce your risk of infection.                                    Remember - BRUSH YOUR TEETH THE MORNING OF SURGERY WITH YOUR REGULAR TOOTHPASTE   Do NOT smoke after Midnight   Take these medicines the morning of surgery with A SIP OF WATER: ditropan.                              You may not have any metal on your body including hair pins, jewelry, and body piercing             Do not wear make-up, lotions, powders, perfumes/cologne, or deodorant  Do not wear nail polish including gel and S&S, artificial/acrylic nails, or any other type of covering on natural nails including finger and toenails. If you have artificial nails, gel coating, etc. that needs to be removed by a nail salon please have this removed prior to surgery or surgery may need to be canceled/ delayed if the surgeon/ anesthesia feels  like they are unable to be safely monitored.   Do not shave  48 hours prior to surgery.    Do not bring valuables to the hospital. [place].   Contacts, dentures or bridgework may not be worn into surgery.   Bring small overnight bag day of surgery.    Patients discharged on the day of surgery will not be allowed to drive home.   Special Instructions: Bring a copy of your healthcare power of attorney and living will documents         the day of surgery if you haven't scanned them before.              Please read over the following fact sheets you were given: IF YOU HAVE QUESTIONS ABOUT YOUR PRE-OP INSTRUCTIONS PLEASE CALL [phone]   [facility] - Preparing for Surgery Before surgery, you can play an important role.  Because skin is not sterile, your skin needs to be as free of germs as possible.  You can reduce the number of germs on your skin by washing with CHG (chlorahexidine gluconate) soap before surgery.  CHG is an antiseptic cleaner which kills germs and bonds with the skin to continue killing germs even after washing. Please DO NOT use if you have an allergy to CHG or antibacterial soaps.  If your skin becomes reddened/irritated stop using the CHG and inform your nurse when you arrive at Short Stay. Do not shave (including legs and underarms) for at least 48 hours prior to the first CHG shower.  You may shave your face/neck. Please follow these instructions carefully:  1.  Shower with CHG Soap the night before surgery and the  morning of Surgery.  2.  If you choose to wash your hair, wash your hair first as usual with your  normal  shampoo.  3.  After you shampoo, rinse your hair and body thoroughly to remove the  shampoo.                           4.  Use CHG as you would any other liquid soap.  You can apply chg directly  to the skin and wash                       Gently with a scrungie or clean washcloth.  5.  Apply the CHG Soap to your body ONLY FROM THE NECK DOWN.   Do not use on face/ open                           Wound or open sores. Avoid contact with eyes,  ears mouth and genitals (private parts).                       Wash face,  Genitals (private parts) with your normal soap.             6.  Wash thoroughly, paying special attention to the area where your surgery  will be performed.  7.  Thoroughly rinse your body with warm water from the neck down.  8.  DO NOT shower/wash with your normal soap after using and rinsing off  the CHG Soap.  9.  Pat yourself dry with a clean towel.            10.  Wear clean pajamas.            11.  Place clean sheets on your bed the night of your first shower and do not  sleep with pets. Day of Surgery : Do not apply any lotions/deodorants the morning of surgery.  Please wear clean clothes to the hospital/surgery center.  FAILURE TO FOLLOW THESE INSTRUCTIONS MAY RESULT IN THE CANCELLATION OF YOUR SURGERY PATIENT SIGNATURE_________________________________  NURSE SIGNATURE__________________________________  ________________________________________________________________________

## 2020-10-28 ENCOUNTER — Other Ambulatory Visit: Payer: Self-pay | Admitting: Surgery

## 2020-10-28 LAB — SARS CORONAVIRUS 2 (TAT 6-24 HRS): SARS Coronavirus 2: NEGATIVE

## 2020-10-29 NOTE — Anesthesia Preprocedure Evaluation (Addendum)
Anesthesia Evaluation  Patient identified by MRN, date of birth, ID band Patient awake    Reviewed: Allergy & Precautions, NPO status , Patient's Chart, lab work & pertinent test results  Airway Mallampati: I  TM Distance: >3 FB Neck ROM: Full    Dental no notable dental hx. (+) Teeth Intact, Dental Advisory Given   Pulmonary neg pulmonary ROS,    Pulmonary exam normal breath sounds clear to auscultation       Cardiovascular Exercise Tolerance: Good Normal cardiovascular exam Rhythm:Regular Rate:Normal     Neuro/Psych Seizures -, Well Controlled,  S/P resection of Brain Tumor negative psych ROS   GI/Hepatic negative GI ROS, Neg liver ROS,   Endo/Other  negative endocrine ROS  Renal/GU Lab Results      Component                Value               Date                      WBC                      6.3                 10/17/2020                HGB                      12.2                10/17/2020                HCT                      36.6                10/17/2020                MCV                      87.6                10/17/2020                PLT                      289                 10/17/2020              Outlet obstruction    Musculoskeletal negative musculoskeletal ROS (+)   Abdominal   Peds  Hematology Lab Results      Component                Value               Date                      WBC                      6.3                 10/17/2020                HGB  12.2                10/17/2020                HCT                      36.6                10/17/2020                MCV                      87.6                10/17/2020                PLT                      289                 10/17/2020              Anesthesia Other Findings All Keppra  Reproductive/Obstetrics (+) Pregnancy                            Anesthesia Physical Anesthesia  Plan  ASA: 2  Anesthesia Plan: General   Post-op Pain Management:    Induction: Intravenous  PONV Risk Score and Plan: 4 or greater and Treatment may vary due to age or medical condition, Midazolam, Dexamethasone, Ondansetron and Scopolamine patch - Pre-op  Airway Management Planned: Oral ETT  Additional Equipment: None  Intra-op Plan:   Post-operative Plan: Extubation in OR  Informed Consent:     Dental advisory given  Plan Discussed with:   Anesthesia Plan Comments: (  GA w Lidocaine infusion )       Anesthesia Quick Evaluation

## 2020-10-30 ENCOUNTER — Observation Stay (HOSPITAL_COMMUNITY)
Admission: RE | Admit: 2020-10-30 | Discharge: 2020-10-31 | Disposition: A | Discharge status: Home / Self Care | Payer: BC Managed Care – PPO | Source: Ambulatory Visit | Attending: Urology | Admitting: Urology

## 2020-10-30 ENCOUNTER — Ambulatory Visit (HOSPITAL_COMMUNITY): Payer: BC Managed Care – PPO | Admitting: Anesthesiology

## 2020-10-30 ENCOUNTER — Encounter (HOSPITAL_COMMUNITY)
Admission: RE | Disposition: A | Discharge status: Home / Self Care | Payer: Self-pay | Source: Ambulatory Visit | Attending: Urology

## 2020-10-30 ENCOUNTER — Observation Stay (HOSPITAL_COMMUNITY): Payer: BC Managed Care – PPO

## 2020-10-30 ENCOUNTER — Encounter (HOSPITAL_COMMUNITY): Payer: Self-pay | Admitting: Urology

## 2020-10-30 DIAGNOSIS — N135 Crossing vessel and stricture of ureter without hydronephrosis: Secondary | ICD-10-CM | POA: Diagnosis present

## 2020-10-30 DIAGNOSIS — Z8619 Personal history of other infectious and parasitic diseases: Secondary | ICD-10-CM | POA: Insufficient documentation

## 2020-10-30 DIAGNOSIS — N130 Hydronephrosis with ureteropelvic junction obstruction: Secondary | ICD-10-CM | POA: Diagnosis not present

## 2020-10-30 DIAGNOSIS — Z79899 Other long term (current) drug therapy: Secondary | ICD-10-CM | POA: Insufficient documentation

## 2020-10-30 DIAGNOSIS — Z960 Presence of urogenital implants: Secondary | ICD-10-CM

## 2020-10-30 HISTORY — PX: ROBOT ASSISTED PYELOPLASTY: SHX5143

## 2020-10-30 LAB — HEMOGLOBIN AND HEMATOCRIT, BLOOD
HCT: 37.1 % (ref 36.0–46.0)
Hemoglobin: 12.4 g/dL (ref 12.0–15.0)

## 2020-10-30 LAB — PREGNANCY, URINE: Preg Test, Ur: NEGATIVE

## 2020-10-30 SURGERY — PYELOPLASTY, ROBOT-ASSISTED
Anesthesia: General | Laterality: Right

## 2020-10-30 MED ORDER — SCOPOLAMINE 1 MG/3DAYS TD PT72
1.0000 | MEDICATED_PATCH | TRANSDERMAL | Status: DC
  Administered 2020-10-30: 1.5 mg via TRANSDERMAL
  Filled 2020-10-30: qty 1

## 2020-10-30 MED ORDER — BUPIVACAINE LIPOSOME 1.3 % IJ SUSP
INTRAMUSCULAR | Status: AC
  Filled 2020-10-30: qty 20

## 2020-10-30 MED ORDER — SODIUM CHLORIDE (PF) 0.9 % IJ SOLN
INTRAMUSCULAR | Status: AC
  Filled 2020-10-30: qty 20

## 2020-10-30 MED ORDER — DOCUSATE SODIUM 100 MG PO CAPS
100.0000 mg | ORAL_CAPSULE | Freq: Two times a day (BID) | ORAL | Status: DC
  Administered 2020-10-31: 100 mg via ORAL
  Filled 2020-10-30: qty 1

## 2020-10-30 MED ORDER — LACTATED RINGERS IV SOLN: INTRAVENOUS | Status: DC

## 2020-10-30 MED ORDER — LIDOCAINE 2% (20 MG/ML) 5 ML SYRINGE
INTRAMUSCULAR | Status: DC | PRN
Start: 2020-10-30 — End: 2020-10-30
  Administered 2020-10-30: 100 mg via INTRAVENOUS

## 2020-10-30 MED ORDER — CEFAZOLIN SODIUM-DEXTROSE 2-4 GM/100ML-% IV SOLN
2.0000 g | Freq: Once | INTRAVENOUS | Status: AC
  Administered 2020-10-30: 2 g via INTRAVENOUS
  Filled 2020-10-30: qty 100

## 2020-10-30 MED ORDER — DIPHENHYDRAMINE HCL 50 MG/ML IJ SOLN: 12.5000 mg | Freq: Four times a day (QID) | INTRAMUSCULAR | Status: DC | PRN

## 2020-10-30 MED ORDER — ROCURONIUM BROMIDE 10 MG/ML (PF) SYRINGE
PREFILLED_SYRINGE | INTRAVENOUS | Status: AC
  Filled 2020-10-30: qty 10

## 2020-10-30 MED ORDER — ONDANSETRON HCL 4 MG/2ML IJ SOLN: 4.0000 mg | INTRAMUSCULAR | Status: DC | PRN

## 2020-10-30 MED ORDER — OXYCODONE HCL 5 MG PO TABS: 5.0000 mg | ORAL_TABLET | ORAL | Status: DC | PRN

## 2020-10-30 MED ORDER — SODIUM CHLORIDE (PF) 0.9 % IJ SOLN
INTRAMUSCULAR | Status: DC | PRN
  Administered 2020-10-30: 20 mL

## 2020-10-30 MED ORDER — PROPOFOL 10 MG/ML IV BOLUS
INTRAVENOUS | Status: AC
  Filled 2020-10-30: qty 20

## 2020-10-30 MED ORDER — ACETAMINOPHEN 325 MG PO TABS: 650.0000 mg | ORAL_TABLET | ORAL | Status: DC | PRN

## 2020-10-30 MED ORDER — SUGAMMADEX SODIUM 200 MG/2ML IV SOLN
INTRAVENOUS | Status: DC | PRN
  Administered 2020-10-30: 160 mg via INTRAVENOUS

## 2020-10-30 MED ORDER — ONDANSETRON HCL 4 MG/2ML IJ SOLN
INTRAMUSCULAR | Status: AC
  Filled 2020-10-30: qty 2

## 2020-10-30 MED ORDER — PHENYLEPHRINE HCL-NACL 20-0.9 MG/250ML-% IV SOLN
INTRAVENOUS | Status: DC | PRN
  Administered 2020-10-30: 75 ug/min via INTRAVENOUS

## 2020-10-30 MED ORDER — DEXMEDETOMIDINE (PRECEDEX) IN NS 20 MCG/5ML (4 MCG/ML) IV SYRINGE
PREFILLED_SYRINGE | INTRAVENOUS | Status: DC | PRN
  Administered 2020-10-30: 8 ug via INTRAVENOUS

## 2020-10-30 MED ORDER — OXYCODONE HCL 5 MG PO TABS: 5.0000 mg | ORAL_TABLET | Freq: Once | ORAL | Status: DC | PRN

## 2020-10-30 MED ORDER — PROPOFOL 10 MG/ML IV BOLUS
INTRAVENOUS | Status: DC | PRN
Start: 2020-10-30 — End: 2020-10-30
  Administered 2020-10-30: 140 mg via INTRAVENOUS

## 2020-10-30 MED ORDER — DIPHENHYDRAMINE HCL 12.5 MG/5ML PO ELIX: 12.5000 mg | ORAL_SOLUTION | Freq: Four times a day (QID) | ORAL | Status: DC | PRN

## 2020-10-30 MED ORDER — HYDROMORPHONE HCL 1 MG/ML IJ SOLN
0.2500 mg | INTRAMUSCULAR | Status: DC | PRN
  Administered 2020-10-30: 0.5 mg via INTRAVENOUS
  Administered 2020-10-30 (×2): 0.25 mg via INTRAVENOUS

## 2020-10-30 MED ORDER — MIDAZOLAM HCL 2 MG/2ML IJ SOLN
INTRAMUSCULAR | Status: AC
  Filled 2020-10-30: qty 2

## 2020-10-30 MED ORDER — LACTATED RINGERS IR SOLN
Status: DC | PRN
  Administered 2020-10-30: 3000 mL

## 2020-10-30 MED ORDER — AMISULPRIDE (ANTIEMETIC) 5 MG/2ML IV SOLN: 10.0000 mg | Freq: Once | INTRAVENOUS | Status: DC | PRN

## 2020-10-30 MED ORDER — FENTANYL CITRATE (PF) 250 MCG/5ML IJ SOLN
INTRAMUSCULAR | Status: DC | PRN
  Administered 2020-10-30: 50 ug via INTRAVENOUS
  Administered 2020-10-30: 100 ug via INTRAVENOUS
  Administered 2020-10-30 (×2): 50 ug via INTRAVENOUS

## 2020-10-30 MED ORDER — PHENYLEPHRINE HCL (PRESSORS) 10 MG/ML IV SOLN
INTRAVENOUS | Status: AC
  Filled 2020-10-30: qty 2

## 2020-10-30 MED ORDER — DEXAMETHASONE SODIUM PHOSPHATE 10 MG/ML IJ SOLN
INTRAMUSCULAR | Status: DC | PRN
  Administered 2020-10-30: 10 mg via INTRAVENOUS

## 2020-10-30 MED ORDER — HYDROMORPHONE HCL 1 MG/ML IJ SOLN
INTRAMUSCULAR | Status: AC
  Filled 2020-10-30: qty 1

## 2020-10-30 MED ORDER — ORAL CARE MOUTH RINSE: 15.0000 mL | Freq: Once | OROMUCOSAL | Status: AC

## 2020-10-30 MED ORDER — ROCURONIUM BROMIDE 10 MG/ML (PF) SYRINGE
PREFILLED_SYRINGE | INTRAVENOUS | Status: DC | PRN
  Administered 2020-10-30 (×2): 20 mg via INTRAVENOUS
  Administered 2020-10-30: 100 mg via INTRAVENOUS
  Administered 2020-10-30: 10 mg via INTRAVENOUS

## 2020-10-30 MED ORDER — ACETAMINOPHEN 10 MG/ML IV SOLN: 1000.0000 mg | Freq: Once | INTRAVENOUS | Status: DC | PRN

## 2020-10-30 MED ORDER — KETOROLAC TROMETHAMINE 15 MG/ML IJ SOLN
15.0000 mg | Freq: Four times a day (QID) | INTRAMUSCULAR | Status: DC
  Administered 2020-10-30 – 2020-10-31 (×4): 15 mg via INTRAVENOUS
  Filled 2020-10-30 (×4): qty 1

## 2020-10-30 MED ORDER — HYDROMORPHONE HCL 1 MG/ML IJ SOLN
0.5000 mg | INTRAMUSCULAR | Status: DC | PRN
Start: 2020-10-30 — End: 2020-10-31
  Administered 2020-10-30 – 2020-10-31 (×5): 1 mg via INTRAVENOUS
  Filled 2020-10-30 (×5): qty 1

## 2020-10-30 MED ORDER — LAMOTRIGINE ER 100 MG PO TB24
100.0000 mg | ORAL_TABLET | Freq: Every day | ORAL | Status: DC
  Administered 2020-10-30: 100 mg via ORAL

## 2020-10-30 MED ORDER — ONDANSETRON HCL 4 MG/2ML IJ SOLN
4.0000 mg | Freq: Once | INTRAMUSCULAR | Status: DC | PRN
Start: 2020-10-30 — End: 2020-10-30

## 2020-10-30 MED ORDER — CHLORHEXIDINE GLUCONATE 0.12 % MT SOLN
15.0000 mL | Freq: Once | OROMUCOSAL | Status: AC
  Administered 2020-10-30: 15 mL via OROMUCOSAL

## 2020-10-30 MED ORDER — CEFAZOLIN SODIUM-DEXTROSE 1-4 GM/50ML-% IV SOLN
1.0000 g | Freq: Three times a day (TID) | INTRAVENOUS | Status: AC
  Administered 2020-10-30 – 2020-10-31 (×2): 1 g via INTRAVENOUS
  Filled 2020-10-30 (×2): qty 50

## 2020-10-30 MED ORDER — DEXTROSE-NACL 5-0.45 % IV SOLN: INTRAVENOUS | Status: DC

## 2020-10-30 MED ORDER — BUPIVACAINE LIPOSOME 1.3 % IJ SUSP
INTRAMUSCULAR | Status: DC | PRN
  Administered 2020-10-30: 20 mL

## 2020-10-30 MED ORDER — OXYCODONE HCL 5 MG/5ML PO SOLN: 5.0000 mg | Freq: Once | ORAL | Status: DC | PRN

## 2020-10-30 MED ORDER — MIDAZOLAM HCL 5 MG/5ML IJ SOLN
INTRAMUSCULAR | Status: DC | PRN
  Administered 2020-10-30: 2 mg via INTRAVENOUS

## 2020-10-30 MED ORDER — DOCUSATE SODIUM 100 MG PO CAPS: 100.0000 mg | ORAL_CAPSULE | Freq: Two times a day (BID) | ORAL | Status: AC

## 2020-10-30 MED ORDER — PHENYLEPHRINE 40 MCG/ML (10ML) SYRINGE FOR IV PUSH (FOR BLOOD PRESSURE SUPPORT)
PREFILLED_SYRINGE | INTRAVENOUS | Status: DC | PRN
  Administered 2020-10-30: 120 ug via INTRAVENOUS
  Administered 2020-10-30: 80 ug via INTRAVENOUS
  Administered 2020-10-30: 120 ug via INTRAVENOUS
  Administered 2020-10-30: 80 ug via INTRAVENOUS

## 2020-10-30 MED ORDER — LIDOCAINE HCL (PF) 2 % IJ SOLN
INTRAMUSCULAR | Status: AC
  Filled 2020-10-30: qty 5

## 2020-10-30 MED ORDER — WATER FOR IRRIGATION, STERILE IR SOLN
Status: DC | PRN
  Administered 2020-10-30: 1000 mL

## 2020-10-30 MED ORDER — FENTANYL CITRATE (PF) 250 MCG/5ML IJ SOLN
INTRAMUSCULAR | Status: AC
  Filled 2020-10-30: qty 5

## 2020-10-30 MED ORDER — ONDANSETRON HCL 4 MG/2ML IJ SOLN
INTRAMUSCULAR | Status: DC | PRN
Start: 2020-10-30 — End: 2020-10-30
  Administered 2020-10-30: 4 mg via INTRAVENOUS

## 2020-10-30 MED ORDER — HYDROCODONE-ACETAMINOPHEN 5-325 MG PO TABS: 1.0000 | ORAL_TABLET | Freq: Four times a day (QID) | ORAL | 0 refills | Status: AC | PRN

## 2020-10-30 MED ORDER — PROMETHAZINE HCL 12.5 MG PO TABS: 12.5000 mg | ORAL_TABLET | ORAL | 0 refills | Status: AC | PRN

## 2020-10-30 MED ORDER — BELLADONNA ALKALOIDS-OPIUM 16.2-60 MG RE SUPP: 1.0000 | Freq: Four times a day (QID) | RECTAL | Status: DC | PRN

## 2020-10-30 SURGICAL SUPPLY — 65 items
ADH SKN CLS APL DERMABOND .7 (GAUZE/BANDAGES/DRESSINGS) ×1
APL PRP STRL LF DISP 70% ISPRP (MISCELLANEOUS) ×1
BAG COUNTER SPONGE SURGICOUNT (BAG) IMPLANT
BAG SPNG CNTER NS LX DISP (BAG)
CATH URET 5FR 28IN OPEN ENDED (CATHETERS) ×1 IMPLANT
CHLORAPREP W/TINT 26 (MISCELLANEOUS) ×2 IMPLANT
CLIP LIGATING HEMO O LOK GREEN (MISCELLANEOUS) ×4 IMPLANT
COVER SURGICAL LIGHT HANDLE (MISCELLANEOUS) ×2 IMPLANT
COVER TIP SHEARS 8 DVNC (MISCELLANEOUS) ×1 IMPLANT
COVER TIP SHEARS 8MM DA VINCI (MISCELLANEOUS) ×2
DECANTER SPIKE VIAL GLASS SM (MISCELLANEOUS) ×2 IMPLANT
DERMABOND ADVANCED (GAUZE/BANDAGES/DRESSINGS) ×1
DERMABOND ADVANCED .7 DNX12 (GAUZE/BANDAGES/DRESSINGS) ×1 IMPLANT
DRAIN CHANNEL 15F RND FF 3/16 (WOUND CARE) ×1 IMPLANT
DRAPE ARM DVNC X/XI (DISPOSABLE) ×4 IMPLANT
DRAPE COLUMN DVNC XI (DISPOSABLE) ×1 IMPLANT
DRAPE DA VINCI XI ARM (DISPOSABLE) ×8
DRAPE DA VINCI XI COLUMN (DISPOSABLE) ×2
DRAPE INCISE IOBAN 66X45 STRL (DRAPES) ×2 IMPLANT
DRAPE SHEET LG 3/4 BI-LAMINATE (DRAPES) ×2 IMPLANT
DRSG TEGADERM 4X4.75 (GAUZE/BANDAGES/DRESSINGS) ×1 IMPLANT
ELECT PENCIL ROCKER SW 15FT (MISCELLANEOUS) ×2 IMPLANT
ELECT REM PT RETURN 15FT ADLT (MISCELLANEOUS) ×2 IMPLANT
EVACUATOR SILICONE 100CC (DRAIN) ×1 IMPLANT
GAUZE SPONGE 2X2 8PLY STRL LF (GAUZE/BANDAGES/DRESSINGS) ×1 IMPLANT
GLOVE SURG ENC MOIS LTX SZ6.5 (GLOVE) ×2 IMPLANT
GLOVE SURG ENC TEXT LTX SZ7.5 (GLOVE) ×4 IMPLANT
GOWN STRL REUS W/TWL LRG LVL3 (GOWN DISPOSABLE) ×2 IMPLANT
GOWN STRL REUS W/TWL XL LVL3 (GOWN DISPOSABLE) ×4 IMPLANT
GUIDEWIRE ANG ZIPWIRE 038X150 (WIRE) ×1 IMPLANT
GUIDEWIRE ZIPWRE .038 STRAIGHT (WIRE) IMPLANT
IRRIG SUCT STRYKERFLOW 2 WTIP (MISCELLANEOUS) ×2
IRRIGATION SUCT STRKRFLW 2 WTP (MISCELLANEOUS) ×1 IMPLANT
KIT BASIN OR (CUSTOM PROCEDURE TRAY) ×2 IMPLANT
KIT TURNOVER KIT A (KITS) IMPLANT
LOOP VESSEL MAXI BLUE (MISCELLANEOUS) IMPLANT
NDL INSUFFLATION 14GA 120MM (NEEDLE) ×1 IMPLANT
NEEDLE INSUFFLATION 14GA 120MM (NEEDLE) ×2 IMPLANT
PACK CYSTO (CUSTOM PROCEDURE TRAY) IMPLANT
PROTECTOR NERVE ULNAR (MISCELLANEOUS) ×4 IMPLANT
SCISSORS LAP 5X45 EPIX DISP (ENDOMECHANICALS) ×2 IMPLANT
SEAL CANN UNIV 5-8 DVNC XI (MISCELLANEOUS) ×3 IMPLANT
SEAL XI 5MM-8MM UNIVERSAL (MISCELLANEOUS) ×6
SET TUBE SMOKE EVAC HIGH FLOW (TUBING) ×2 IMPLANT
SOLUTION ELECTROLUBE (MISCELLANEOUS) ×2 IMPLANT
SPONGE GAUZE 2X2 8PLY STRL LF (GAUZE/BANDAGES/DRESSINGS) ×1 IMPLANT
SPONGE GAUZE 2X2 STER 10/PKG (GAUZE/BANDAGES/DRESSINGS) ×1
STENT URET 6FRX24 CONTOUR (STENTS) ×1 IMPLANT
SUT ETHILON 3 0 PS 1 (SUTURE) ×1 IMPLANT
SUT MNCRL AB 4-0 PS2 18 (SUTURE) ×4 IMPLANT
SUT VIC AB 0 CT1 27 (SUTURE)
SUT VIC AB 0 CT1 27XBRD ANTBC (SUTURE) IMPLANT
SUT VIC AB 0 UR5 27 (SUTURE) IMPLANT
SUT VIC AB 4-0 RB1 27 (SUTURE) ×12
SUT VIC AB 4-0 RB1 27XBRD (SUTURE) ×2 IMPLANT
SUT VICRYL 0 UR6 27IN ABS (SUTURE) ×2 IMPLANT
SUT VLOC BARB 180 ABS3/0GR12 (SUTURE)
SUTURE VLOC BRB 180 ABS3/0GR12 (SUTURE) IMPLANT
SYR BULB IRRIG 60ML STRL (SYRINGE) IMPLANT
TOWEL OR 17X26 10 PK STRL BLUE (TOWEL DISPOSABLE) ×2 IMPLANT
TOWEL OR NON WOVEN STRL DISP B (DISPOSABLE) ×4 IMPLANT
TRAY FOLEY MTR SLVR 16FR STAT (SET/KITS/TRAYS/PACK) ×2 IMPLANT
TRAY LAPAROSCOPIC (CUSTOM PROCEDURE TRAY) ×2 IMPLANT
TROCAR XCEL 12X100 BLDLESS (ENDOMECHANICALS) ×2 IMPLANT
WATER STERILE IRR 1000ML POUR (IV SOLUTION) ×2 IMPLANT

## 2020-10-30 NOTE — Anesthesia Procedure Notes (Signed)
Procedure Name: Intubation Date/Time: 10/30/2020 12:06 PM Performed by: Myna Bright, CRNA Pre-anesthesia Checklist: Patient identified, Emergency Drugs available, Suction available and Patient being monitored Patient Re-evaluated:Patient Re-evaluated prior to induction Oxygen Delivery Method: Circle system utilized Preoxygenation: Pre-oxygenation with 100% oxygen Induction Type: IV induction Ventilation: Mask ventilation without difficulty Laryngoscope Size: Mac and 3 Grade View: Grade I Tube type: Oral Tube size: 7.0 mm Number of attempts: 1 Airway Equipment and Method: Stylet Placement Confirmation: ETT inserted through vocal cords under direct vision, positive ETCO2 and breath sounds checked- equal and bilateral Secured at: 21 cm Tube secured with: Tape Dental Injury: Teeth and Oropharynx as per pre-operative assessment

## 2020-10-30 NOTE — Discharge Instructions (Signed)

## 2020-10-30 NOTE — Anesthesia Postprocedure Evaluation (Signed)
Anesthesia Post Note  Patient: April Peterson  Procedure(s) Performed: XI ROBOTIC ASSISTED PYELOPLASTY (Right)     Patient location during evaluation: PACU Anesthesia Type: General Level of consciousness: awake and alert Pain management: pain level controlled Vital Signs Assessment: post-procedure vital signs reviewed and stable Respiratory status: spontaneous breathing, nonlabored ventilation, respiratory function stable and patient connected to nasal cannula oxygen Cardiovascular status: blood pressure returned to baseline and stable Postop Assessment: no apparent nausea or vomiting Anesthetic complications: no   No notable events documented.  Last Vitals:  Vitals:   10/30/20 1545 10/30/20 1600  BP: 127/84 128/82  Pulse: 70 79  Resp: 13 12  Temp:    SpO2: 100% 100%    Last Pain:  Vitals:   10/30/20 1600  TempSrc:   PainSc: 5                  Barnet Glasgow

## 2020-10-30 NOTE — Plan of Care (Signed)
  Problem: Health Behavior/Discharge Planning: Goal: Ability to manage health-related needs will improve Outcome: Progressing   Problem: Clinical Measurements: Goal: Ability to maintain clinical measurements within normal limits will improve Outcome: Progressing Goal: Will remain free from infection Outcome: Progressing   Problem: Activity: Goal: Risk for activity intolerance will decrease Outcome: Progressing   Problem: Nutrition: Goal: Adequate nutrition will be maintained Outcome: Progressing   Problem: Elimination: Goal: Will not experience complications related to urinary retention Outcome: Progressing   Problem: Pain Managment: Goal: General experience of comfort will improve Outcome: Progressing   Problem: Safety: Goal: Ability to remain free from injury will improve Outcome: Progressing

## 2020-10-30 NOTE — Op Note (Signed)
Operative Note  Preoperative diagnosis:  1.  Right UPJ obstruction  Postoperative diagnosis: 1.  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  Robot-assisted laparoscopic right pyeloplasty  Surgeon: Ellison Hughs, MD  Assistants:  None  Anesthesia:  General  Complications:  None  EBL: 50 mL  Specimens: 1.  Portion of right UPJ  Drains/Catheters: 1.  Foley catheter 2.  6 French, 24 cm JJ stent without tether 3.  Right lower quadrant JP drain  Intraoperative findings:   Arterial lower pole crossing vessel was causing tortuosity and obstruction of the right UPJ The ureteropelvic anastomosis was watertight at the conclusion of the case  Indication:  April Peterson is a 40 y.o. female with a right sided UPJ obstruction with a differential function of 18% on the right and 82% on the left as seen on MAG3 renogram.  She has been consented for the above procedures, voices understanding and wishes to proceed.  Description of procedure:  After informed consent was signed, the patient was taken back to the operating room and properly anesthetized.  The patient was then placed in the left lateral decubitus position with all pressure points padded.  The abdomen was then prepped and draped in the usual sterile fashion.  A time-out was then performed.     An 8 mm incision was then made lateral to the right rectus muscle at the level of the right 12th rib.  A Veress needle was then used to access the abdominal cavity.  A saline drop test showed no signs of obstruction and aspiration of the Veress needle revealed no blood or sucus.  The abdominal cavity was then insufflated to 15 mmHg.  An 8 mm robotic trocar was then atraumatically inserted into the abdominal cavity.  The robotic camera was then inserted through the port and inspection of the abdominal cavity revealed no evidence of adjacent organ or vessel injury.  We then placed three additional 8 mm  robotic ports, a 15 mm assistant port and a 5 mm subxiphoid port in such as fashion as to triangulate the right renal hilum.  The robot was then docked into postion.   The white line of Toldt along the ascending colon was then incised, allowing [place] to reflect the colon medially and expose the anterior surface of the right kidney.  The duodenum was then Kocherized medially, which abruptly led [place] to identification of the inferior vena cava.    Once the colon was adequately mobilized, we moved to the lower pole and identified the gonadal vein and ureter.  The gonadal vein was then left running parallel to the vena cava and the right ureter was reflected anteriorly.  The ureter was then traced cephalad to the diffusely dilated UPJ, where the lower pole accessory renal artery was identified, mobilized and reflected off of the UPJ.   I then incised the medial portion of the UPJ and then spatulated the lateral aspects approximately 2 cm.  I placed 4-0 Vicryl sutures at the apex and posterior lateral aspects of the spatulated ureter.  I then completely transected the ureter from the UPJ and brought the two pieces anterior to the crossing lower pole vessel.  The medial aspect of the UPJ was spatulated approximately 2 cm.  The posterior anastomosis was completed via interrupted 4-0 Vicryl, a zip wire was then advanced, in an antegrade, fashion down the ureter. A 6 French, 24 cm stent was then placed over the wire and into position within  the ureter and advanced into the renal pelvis, under direct vision.  The anastomosis was then completed using 4-0 Vicryl suture in an interrupted fashion.  The anastomosis appeared watertight upon completion.   A JP drain was then placed through her right lower quadrant incision and into position within the lower abdomen.  The abdomen was then desufflated and all ports were removed.  The assistant port was then closed using an interrupted 0 Vicryl suture.  The skin was then  reapproximated using 4-0 Monocryl and dressed with Dermabond.  The patient was then placed in the supine position and a fluoroscopic image was obtained, confirming good stent placement within the right renal pelvis as well as the bladder.  The patient was then awoken from anesthesia having tolerated the procedure well.  She was transferred to the postanesthesia unit in stable condition.  Plan: Monitor on the floor overnight.  JP creatinine in the morning

## 2020-10-30 NOTE — H&P (Signed)
Urology Preoperative H&P   Chief Complaint: Right UPJ obstruction  History of Present Illness: April Peterson is a 40 y.o. female with a right-sided UPJ obstruction likely secondary to lower pole crossing vessels vs high insertion. She was initially seen in consultation by Dr. Gloriann Loan due to ongoing renal colic. Recent MAG3 renogram showed a differential function of 82% on the left and 18% on the right.  The patient underwent diagnostic ureteroscopy on 10/09/2020 and found to have no intrinsic ureteral stricture disease or any other internal source of her UPJ obstruction.  She denies interval episodes of flank pain, dysuria, hematuria or UTIs.     Past Medical History:  Diagnosis Date   COVID 01/23/2019   COVID 2022   pt not sure when allergy symptoms x 2 days all symptoms resolved   Meningioma [facility])    removed surgically   Seizure ([place]) 07/18/2018   x 1 after craniotomy sees dr Tish Frederickson [facility]   Ureteropelvic junction (UPJ) obstruction 10/03/2020   Wears glasses     Past Surgical History:  Procedure Laterality Date   APPLICATION OF CRANIAL NAVIGATION Left 07/18/2018   Procedure: APPLICATION OF CRANIAL NAVIGATION;  Surgeon: Eustace Moore, MD;  Location: [place];  Service: Neurosurgery;  Laterality: Left;   CRANIOTOMY Left 07/18/2018   Procedure: LEFT CRANIOTOMY TUMOR EXCISION WITH BRAIN LAB;  Surgeon: Eustace Moore, MD;  Location: [place];  Service: Neurosurgery;  Laterality: Left;   CYSTOSCOPY WITH URETEROSCOPY AND STENT PLACEMENT Right 10/09/2020   Procedure: CYSTOSCOPY/ RETROGRADE  WITH URETEROSCOPY AND STENT PLACEMENT;  Surgeon: Ceasar Mons, MD;  Location: [facility];  Service: Urology;  Laterality: Right;   IR ANGIO EXTERNAL CAROTID SEL EXT CAROTID UNI L MOD SED  07/15/2018   IR ANGIO INTRA EXTRACRAN SEL INTERNAL CAROTID UNI L MOD SED  07/15/2018   IR ANGIOGRAM FOLLOW UP STUDY  07/15/2018   IR NEURO EACH ADD'L AFTER BASIC UNI LEFT (MS)   07/15/2018   IR TRANSCATH/EMBOLIZ  07/15/2018   RADIOLOGY WITH ANESTHESIA N/A 07/15/2018   Procedure: IR WITH ANESTHESIA ANGIOGRAM WITH POSSIBLE WITH POSSIBLE TUMOR EMBOLIZATION;  Surgeon: Consuella Lose, MD;  Location: [place];  Service: Radiology;  Laterality: N/A;   WISDOM TOOTH EXTRACTION     yrs ago per pt on 10-03-2020    Allergies:  Allergies  Allergen Reactions   Keppra [Levetiracetam]     "dizzy, tired, out of it"    Family History  Problem Relation Age of Onset   Diabetes Father     Social History:  reports that she has never smoked. She has never used smokeless tobacco. She reports current alcohol use. She reports that she does not use drugs.  ROS: A complete review of systems was performed.  All systems are negative except for pertinent findings as noted.  Physical Exam:  Vital signs in last 24 hours: Temp:  [98.4 F (36.9 C)] 98.4 F (36.9 C) (10/26 1115) Pulse Rate:  [76] 76 (10/26 1115) Resp:  [16] 16 (10/26 1115) BP: (143)/(91) 143/91 (10/26 1115) SpO2:  [100 %] 100 % (10/26 1115) Constitutional:  Alert and oriented, No acute distress Cardiovascular: Regular rate and rhythm, No JVD Respiratory: Normal respiratory effort, Lungs clear bilaterally GI: Abdomen is soft, nontender, nondistended, no abdominal masses GU: No CVA tenderness Lymphatic: No lymphadenopathy Neurologic: Grossly intact, no focal deficits Psychiatric: Normal mood and affect  Laboratory Data:  No results for input(s): WBC, HGB, HCT, PLT in the last 72 hours.  No  results for input(s): NA, K, CL, GLUCOSE, BUN, CALCIUM, CREATININE in the last 72 hours.  Invalid input(s): CO3   Results for orders placed or performed during the hospital encounter of 10/30/20 (from the past 24 hour(s))  Pregnancy, urine     Status: None   Collection Time: 10/30/20 11:10 AM  Result Value Ref Range   Preg Test, Ur NEGATIVE NEGATIVE   Recent Results (from the past 240 hour(s))  SARS Coronavirus 2 (TAT  6-24 hrs)     Status: None   Collection Time: 10/28/20 12:00 AM  Result Value Ref Range Status   SARS Coronavirus 2 RESULT: NEGATIVE  Final    Comment: RESULT: NEGATIVESARS-CoV-2 INTERPRETATION:A NEGATIVE  test result means that SARS-CoV-2 RNA was not present in the specimen above the limit of detection of this test. This does not preclude a possible SARS-CoV-2 infection and should not be used as the  sole basis for patient management decisions. Negative results must be combined with clinical observations, patient history, and epidemiological information. Optimum specimen types and timing for peak viral levels during infections caused by SARS-CoV-2  have not been determined. Collection of multiple specimens or types of specimens may be necessary to detect virus. Improper specimen collection and handling, sequence variability under primers/probes, or organism present below the limit of detection may  lead to false negative results. Positive and negative predictive values of testing are highly dependent on prevalence. False negative test results are more likely when prevalence of disease is high.The expected result is NEGATIVE.Fact S heet for  Healthcare Providers: LocalChronicle.no Sheet for Patients: SalonLookup.es Reference Range - Negative     Renal Function: No results for input(s): CREATININE in the last 168 hours. CrCl cannot be calculated (Patient's most recent lab result is older than the maximum 21 days allowed.).  Radiologic Imaging: Complexity of Data:  Records Review:   Previous Patient Records  X-Ray Review: C.T. Abdomen/Pelvis: Reviewed Films. Reviewed Report. Discussed With Patient. CLINICAL DATA: Right flank pain EXAM: CT ABDOMEN AND PELVIS WITHOUT CONTRAST TECHNIQUE: Multidetector CT imaging of the abdomen and pelvis was performed following the standard protocol without IV contrast. COMPARISON: None. FINDINGS: Lower  chest: The lung bases are clear. The imaged heart is unremarkable. Hepatobiliary: The liver and gallbladder are normal. There is no biliary ductal dilatation. Pancreas: Unremarkable. Spleen: The spleen is enlarged measuring up to 17.2 cm cc. There is no focal lesion. Adrenals/Urinary Tract: The adrenals are unremarkable. There is moderate right hydronephrosis with dilation of the renal pelvis/proximal collecting system. The distal ureter is decompressed. The left kidney is unremarkable, with no hydronephrosis or hydroureter. No obstructing stone or lesion is identified. The bladder is unremarkable. Stomach/Bowel: The stomach is unremarkable. There is no evidence of bowel obstruction. There is no abnormal bowel wall thickening or inflammatory change. The appendix is normal. Vascular/Lymphatic: The abdominal aorta is nonaneurysmal. There is no abdominal or pelvic lymphadenopathy. Reproductive: The uterus and ovaries are physiologic in appearance. Other: There is a small amount of minimally complex fluid in the pelvis, within physiologic limits. There is no free intraperitoneal air. There is a tiny fat containing umbilical hernia. Musculoskeletal: There is a benign-appearing partially sclerotic lesion in the right iliac bone which may reflect an enchondroma or hemangioma. There is no evidence of acute osseous abnormality. IMPRESSION: Moderate right hydronephrosis and proximal collecting system dilation with no obstructing stone or lesion identified. This may be due to a stricture or ureteral mass. Recommend CT urogram and urology consult. Nuclear scintigraphic Lasix renal scan  may also be helpful to assess for functional obstruction. Electronically Signed By: Valetta Mole M.D. On: 08/26/2020 13:32 Lasix Renogram: Reviewed Films. Reviewed Report. Discussed With Patient.    Notes:                     CLINICAL DATA: RIGHT hydronephrosis    EXAM:  NUCLEAR MEDICINE RENAL SCAN WITH DIURETIC ADMINISTRATION     TECHNIQUE:  Radionuclide angiographic and sequential renal images were obtained  after intravenous injection of radiopharmaceutical. Imaging was  continued during slow intravenous injection of Lasix approximately  15 minutes after the start of the examination.    RADIOPHARMACEUTICALS: 4.9 mCi Technetium-69m MAG3 IV    COMPARISON: CT 08/26/2020    FINDINGS:  Flow: Prompt arterial flow to the LEFT kidney. Delayed arterial flow  to the RIGHT kidney..    Left renogram: Uniform uptake of counts in the renal cortex. Counts  are promptly excreted into the collecting system and cleared prior  to administration of Lasix. Lasix augment clearance. No postvoid  residual.    Right renogram: Delayed cortical uptake within the RIGHT renal  parenchyma. Faint uptake noted in the parenchyma with counts  excreted into the RIGHT renal pelvis which is patulous. No clear  clearance of radiotracer from the RIGHT renal pelvis. Postvoid  residual on the RIGHT.    Differential:    Left kidney = 82 %    Right kidney = 18 %    T1/2 post Lasix :    Left kidney = (counts clear near completely prior to administration  of Lasix)    Right kidney = no measurable excretion.    IMPRESSION:  1. Severe obstructive hydronephrosis of the RIGHT kidney. Proximal  obstruction at the RIGHT ureteropelvic junction.  2. Asymmetric renal function as above.      Electronically Signed  By: Suzy Bouchard M.D.  On: 09/17/2020 17:04   I independently reviewed the above imaging studies.  Assessment and Plan April Peterson is a 40 y.o. female with a right-sided UPJ obstruction  -The risks, benefits and alternatives of RIGHT robot-assisted pyeloplasty was discussed with the patient. Risks include, but are not limited to, bleeding, infection, recurrence of the UPJ obstruction, ureteral stricture disease, urine leak, chronic pain, nephrectomy, multiple surgeries to correct her UPJ obstruction and the need for an  indwelling stent, MI, CVA, PE, DVT and in the inherent risks of general anesthesia. She voices understanding and wishes to proceed.      Ellison Hughs, MD 10/30/2020, 11:20 AM  [facility] Pager: [phone]

## 2020-10-30 NOTE — Transfer of Care (Signed)
Immediate Anesthesia Transfer of Care Note  Patient: Noya Santarelli  Procedure(s) Performed: XI ROBOTIC ASSISTED PYELOPLASTY (Right)  Patient Location: PACU  Anesthesia Type:General  Level of Consciousness: awake, alert  and oriented  Airway & Oxygen Therapy: Patient Spontanous Breathing and Patient connected to face mask oxygen  Post-op Assessment: Report given to RN, Post -op Vital signs reviewed and stable and Patient moving all extremities X 4  Post vital signs: Reviewed and stable  Last Vitals:  Vitals Value Taken Time  BP 125/74   Temp    Pulse 84 10/30/20 1530  Resp 13 10/30/20 1530  SpO2 100 % 10/30/20 1530  Vitals shown include unvalidated device data.  Last Pain:  Vitals:   10/30/20 1137  TempSrc:   PainSc: 0-No pain         Complications: No notable events documented.

## 2020-10-31 ENCOUNTER — Encounter (HOSPITAL_COMMUNITY): Payer: Self-pay | Admitting: Urology

## 2020-10-31 DIAGNOSIS — N130 Hydronephrosis with ureteropelvic junction obstruction: Secondary | ICD-10-CM | POA: Diagnosis not present

## 2020-10-31 LAB — BASIC METABOLIC PANEL
Anion gap: 8 (ref 5–15)
BUN: 10 mg/dL (ref 6–20)
CO2: 22 mmol/L (ref 22–32)
Calcium: 8.7 mg/dL — ABNORMAL LOW (ref 8.9–10.3)
Chloride: 106 mmol/L (ref 98–111)
Creatinine, Ser: 0.81 mg/dL (ref 0.44–1.00)
GFR, Estimated: >60 mL/min (ref >60)
Glucose, Bld: 136 mg/dL — ABNORMAL HIGH (ref 70–99)
Potassium: 4.1 mmol/L (ref 3.5–5.1)
Sodium: 136 mmol/L (ref 135–145)

## 2020-10-31 LAB — SURGICAL PATHOLOGY

## 2020-10-31 LAB — HEMOGLOBIN AND HEMATOCRIT, BLOOD
HCT: 37.1 % (ref 36.0–46.0)
Hemoglobin: 12.2 g/dL (ref 12.0–15.0)

## 2020-10-31 MED ORDER — BISACODYL 10 MG RE SUPP
10.0000 mg | Freq: Once | RECTAL | Status: AC
  Administered 2020-10-31: 10 mg via RECTAL
  Filled 2020-10-31: qty 1

## 2020-10-31 MED ORDER — CHLORHEXIDINE GLUCONATE CLOTH 2 % EX PADS: 6.0000 | MEDICATED_PAD | Freq: Every day | CUTANEOUS | Status: DC

## 2020-10-31 NOTE — Discharge Summary (Signed)
  Date of admission: 10/30/2020  Date of discharge: 10/31/2020  Admission diagnosis: Right UPJ obstruction  Discharge diagnosis: same  Secondary diagnoses: meningioma, seizure  History and Physical: For full details, please see admission history and physical. Briefly, April Peterson is a 40 y.o. year old patient with a right-sided UPJ obstruction likely secondary to lower pole crossing vessels vs high insertion. She was initially seen in consultation by Dr. Gloriann Loan due to ongoing renal colic. Recent MAG3 renogram showed a differential function of 82% on the left and 18% on the right.  The patient underwent diagnostic ureteroscopy on 10/09/2020 and found to have no intrinsic ureteral stricture disease or any other internal source of her UPJ obstruction.  She denies interval episodes of flank pain, dysuria, hematuria or UTIs.    Hospital Course: Pt was admitted and taken to the OR on 10/30/20 for a right robotic assisted lap pyeloplasty by Dr. Lovena Neighbours.  Pt tolerated the procedure well and was hemodynamically stable throughout.  She was extubated without complication and woke up from anesthesia neurologically intact. She was transferred from the OR to PACU and then to the floor without incident.  Post op course progressed as expected.  Vitals remained stable.  On POD 1 foley and JP were removed and her diet was advanced as tolerated.  She was able to void and passing flatus.  Pt ambulated without difficulty. Hg and Cr were stable.  Pt was felt stable for d/c home on the afternoon of POD 1.   Laboratory values:  Recent Labs    10/30/20 1640 10/31/20 0402  HGB 12.4 12.2  HCT 37.1 37.1   Recent Labs    10/31/20 0402  CREATININE 0.81    Disposition: Home  Discharge instruction: The patient was instructed to be ambulatory but told to refrain from heavy lifting, strenuous activity, or driving.   Discharge medications:  Allergies as of 10/31/2020       Reactions   Keppra [levetiracetam]     "dizzy, tired, out of it"        Medication List     STOP taking these medications    multivitamin with minerals tablet   tamsulosin 0.4 MG Caps capsule Commonly known as: FLOMAX   UNABLE TO FIND       TAKE these medications    docusate sodium 100 MG capsule Commonly known as: COLACE Take 1 capsule (100 mg total) by mouth 2 (two) times daily.   HYDROcodone-acetaminophen 5-325 MG tablet Commonly known as: Norco Take 1-2 tablets by mouth every 6 (six) hours as needed for moderate pain.   LamoTRIgine 100 MG Tb24 24 hour tablet Take 1 tablet (100 mg total) by mouth at bedtime.   oxybutynin 5 MG tablet Commonly known as: DITROPAN Take 1 tablet (5 mg total) by mouth every 8 (eight) hours as needed for bladder spasms.   promethazine 12.5 MG tablet Commonly known as: PHENERGAN Take 1 tablet (12.5 mg total) by mouth every 4 (four) hours as needed for nausea or vomiting.        Followup:   Follow-up Information     Karen Kays, NP Follow up on 11/13/2020.   Specialty: Nurse Practitioner Why: at 8:00 Contact information: [address] [place] [address] [phone]

## 2020-10-31 NOTE — Plan of Care (Signed)
Discharge instructions reviewed with patient, questions answered, verbalized understanding.  Patient transported to main entrance via wheelchair to be taken home by mother. Home med and belongings in patients possession at discharge.

## 2020-11-14 NOTE — Patient Instructions (Incomplete)

## 2020-11-14 NOTE — Progress Notes (Deleted)
No chief complaint on file.    HISTORY OF PRESENT ILLNESS:  11/14/20 ALL:  April Peterson is a 40 y.o. female here today for follow up for seizures. She continues lamotrigine ER 100mg  at bedtime.    HISTORY (copied from Dr Gladstone Lighter previous note)  UPDATE (11/21/19, VRP): Since last visit, doing well. No seizures. Tolerating LTG ER 100mg  at bedtime. Some fatigue and intermittent dizziness in AM.    PRIOR HPI: 40 year old female here for evaluation of seizure.   July 2020 patient was 10 days postpartum, when all of a sudden she had difficulty talking, confused, mild right-sided weakness.  Patient went to the hospital for evaluation.  She was found to have a large left frontal meningioma with mass-effect.  She underwent treatment with embolization and resection.  Patient was found to have epileptiform discharges in left frontal region and therefore started on levetiracetam 500 mg twice a day.  Patient took antiseizure medication until September but then was weaned off due to side effect.  Patient did well until January 2021.  Patient had episode of eyes twitching to the right side lasting for 1 minute.  No other postictal symptoms.  Patient felt dehydrated that day from slightly increased alcohol use the night before.   REVIEW OF SYSTEMS: Out of a complete 14 system review of symptoms, the patient complains only of the following symptoms, and all other reviewed systems are negative.   ALLERGIES: Allergies  Allergen Reactions   Keppra [Levetiracetam]     "dizzy, tired, out of it"     HOME MEDICATIONS: Outpatient Medications Prior to Visit  Medication Sig Dispense Refill   docusate sodium (COLACE) 100 MG capsule Take 1 capsule (100 mg total) by mouth 2 (two) times daily.     HYDROcodone-acetaminophen (NORCO) 5-325 MG tablet Take 1-2 tablets by mouth every 6 (six) hours as needed for moderate pain. 20 tablet 0   LamoTRIgine 100 MG TB24 24 hour tablet Take 1 tablet (100 mg  total) by mouth at bedtime. 90 tablet 1   oxybutynin (DITROPAN) 5 MG tablet Take 1 tablet (5 mg total) by mouth every 8 (eight) hours as needed for bladder spasms. 30 tablet 1   promethazine (PHENERGAN) 12.5 MG tablet Take 1 tablet (12.5 mg total) by mouth every 4 (four) hours as needed for nausea or vomiting. 15 tablet 0   No facility-administered medications prior to visit.     PAST MEDICAL HISTORY: Past Medical History:  Diagnosis Date   COVID 01/23/2019   COVID 2022   pt not sure when allergy symptoms x 2 days all symptoms resolved   Meningioma ([place])    removed surgically   Seizure ([place]) 07/18/2018   x 1 after craniotomy sees dr Tish Frederickson [facility]   Ureteropelvic junction (UPJ) obstruction 10/03/2020   Wears glasses      PAST SURGICAL HISTORY: Past Surgical History:  Procedure Laterality Date   APPLICATION OF CRANIAL NAVIGATION Left 07/18/2018   Procedure: APPLICATION OF CRANIAL NAVIGATION;  Surgeon: Eustace Moore, MD;  Location: [facility];  Service: Neurosurgery;  Laterality: Left;   CRANIOTOMY Left 07/18/2018   Procedure: LEFT CRANIOTOMY TUMOR EXCISION WITH BRAIN LAB;  Surgeon: Eustace Moore, MD;  Location: [facility];  Service: Neurosurgery;  Laterality: Left;   CYSTOSCOPY WITH URETEROSCOPY AND STENT PLACEMENT Right 10/09/2020   Procedure: CYSTOSCOPY/ RETROGRADE  WITH URETEROSCOPY AND STENT PLACEMENT;  Surgeon: Ceasar Mons, MD;  Location: [facility];  Service: Urology;  Laterality: Right;  IR ANGIO EXTERNAL CAROTID SEL EXT CAROTID UNI L MOD SED  07/15/2018   IR ANGIO INTRA EXTRACRAN SEL INTERNAL CAROTID UNI L MOD SED  07/15/2018   IR ANGIOGRAM FOLLOW UP STUDY  07/15/2018   IR NEURO EACH ADD'L AFTER BASIC UNI LEFT (MS)  07/15/2018   IR TRANSCATH/EMBOLIZ  07/15/2018   RADIOLOGY WITH ANESTHESIA N/A 07/15/2018   Procedure: IR WITH ANESTHESIA ANGIOGRAM WITH POSSIBLE WITH POSSIBLE TUMOR EMBOLIZATION;  Surgeon: Consuella Lose, MD;  Location:  [facility];  Service: Radiology;  Laterality: N/A;   ROBOT ASSISTED PYELOPLASTY Right 10/30/2020   Procedure: XI ROBOTIC ASSISTED PYELOPLASTY;  Surgeon: Ceasar Mons, MD;  Location: [facility];  Service: Urology;  Laterality: Right;   WISDOM TOOTH EXTRACTION     yrs ago per pt on 10-03-2020     FAMILY HISTORY: Family History  Problem Relation Age of Onset   Diabetes Father      SOCIAL HISTORY: Social History   Socioeconomic History   Marital status: Married    Spouse name: Not on file   Number of children: 2   Years of education: Not on file   Highest education level: Bachelor's degree (e.g., BA, AB, BS)  Occupational History    Comment: home maker  Tobacco Use   Smoking status: Never   Smokeless tobacco: Never  Vaping Use   Vaping Use: Never used  Substance and Sexual Activity   Alcohol use: Yes    Comment: couple times per week   Drug use: No   Sexual activity: Yes    Birth control/protection: None  Other Topics Concern   Not on file  Social History Narrative   Lives with family       Social Determinants of Health   Financial Resource Strain: Not on file  Food Insecurity: Not on file  Transportation Needs: Not on file  Physical Activity: Not on file  Stress: Not on file  Social Connections: Not on file  Intimate Partner Violence: Not on file     PHYSICAL EXAM  There were no vitals filed for this visit. There is no height or weight on file to calculate BMI.  Generalized: Well developed, in no acute distress  Cardiology: normal rate and rhythm, no murmur auscultated  Respiratory: clear to auscultation bilaterally    Neurological examination  Mentation: Alert oriented to time, place, history taking. Follows all commands speech and language fluent Cranial nerve II-XII: Pupils were equal round reactive to light. Extraocular movements were full, visual field were full on confrontational test. Facial sensation and strength were normal. Uvula tongue  midline. Head turning and shoulder shrug  were normal and symmetric. Motor: The motor testing reveals 5 over 5 strength of all 4 extremities. Good symmetric motor tone is noted throughout.  Sensory: Sensory testing is intact to soft touch on all 4 extremities. No evidence of extinction is noted.  Coordination: Cerebellar testing reveals good finger-nose-finger and heel-to-shin bilaterally.  Gait and station: Gait is normal. Tandem gait is normal. Romberg is negative. No drift is seen.  Reflexes: Deep tendon reflexes are symmetric and normal bilaterally.    DIAGNOSTIC DATA (LABS, IMAGING, TESTING) - I reviewed patient records, labs, notes, testing and imaging myself where available.  Lab Results  Component Value Date   WBC 6.3 10/17/2020   HGB 12.2 10/31/2020   HCT 37.1 10/31/2020   MCV 87.6 10/17/2020   PLT 289 10/17/2020      Component Value Date/Time   NA 136 10/31/2020 0402  NA 139 11/17/2019 1017   K 4.1 10/31/2020 0402   CL 106 10/31/2020 0402   CO2 22 10/31/2020 0402   GLUCOSE 136 (H) 10/31/2020 0402   BUN 10 10/31/2020 0402   BUN 17 11/17/2019 1017   CREATININE 0.81 10/31/2020 0402   CALCIUM 8.7 (L) 10/31/2020 0402   PROT 6.9 11/17/2019 1017   ALBUMIN 4.7 11/17/2019 1017   AST 21 11/17/2019 1017   ALT 18 11/17/2019 1017   ALKPHOS 76 11/17/2019 1017   BILITOT 0.9 11/17/2019 1017   GFRNONAA >60 10/31/2020 0402   GFRAA 111 11/17/2019 1017   No results found for: CHOL, HDL, LDLCALC, LDLDIRECT, TRIG, CHOLHDL No results found for: HGBA1C No results found for: VITAMINB12 No results found for: TSH  No flowsheet data found.   No flowsheet data found.   ASSESSMENT AND PLAN  40 y.o. year old female  has a past medical history of COVID (01/23/2019), COVID (2022), Meningioma ([place]), Seizure ([place]) (07/18/2018), Ureteropelvic junction (UPJ) obstruction (10/03/2020), and Wears glasses. here with    No diagnosis found.   No orders of the defined types were placed in  this encounter.    No orders of the defined types were placed in this encounter.     Debbora Presto, MSN, FNP-C 11/14/2020, 5:03 PM  [facility] [address], [place], [place] [address] [phone]

## 2020-11-19 ENCOUNTER — Ambulatory Visit: Payer: BC Managed Care – PPO | Admitting: Diagnostic Neuroimaging

## 2020-11-19 ENCOUNTER — Ambulatory Visit: Payer: BC Managed Care – PPO | Admitting: Family Medicine

## 2020-11-19 ENCOUNTER — Encounter: Payer: Self-pay | Admitting: Family Medicine

## 2020-11-19 DIAGNOSIS — G40009 Localization-related (focal) (partial) idiopathic epilepsy and epileptic syndromes with seizures of localized onset, not intractable, without status epilepticus: Secondary | ICD-10-CM

## 2020-12-31 ENCOUNTER — Other Ambulatory Visit: Payer: Self-pay | Admitting: *Deleted

## 2020-12-31 ENCOUNTER — Encounter: Payer: Self-pay | Admitting: *Deleted

## 2020-12-31 MED ORDER — LAMOTRIGINE ER 100 MG PO TB24: 100.0000 mg | ORAL_TABLET | Freq: Every day | ORAL | 0 refills | Status: DC

## 2021-02-05 ENCOUNTER — Other Ambulatory Visit (HOSPITAL_COMMUNITY): Payer: Self-pay | Admitting: Urology

## 2021-02-05 ENCOUNTER — Other Ambulatory Visit: Payer: Self-pay | Admitting: Urology

## 2021-02-05 DIAGNOSIS — N130 Hydronephrosis with ureteropelvic junction obstruction: Secondary | ICD-10-CM

## 2021-02-20 ENCOUNTER — Other Ambulatory Visit: Payer: Self-pay

## 2021-02-20 ENCOUNTER — Ambulatory Visit (HOSPITAL_COMMUNITY)
Admission: RE | Admit: 2021-02-20 | Discharge: 2021-02-20 | Disposition: A | Discharge status: Home / Self Care | Payer: BC Managed Care – PPO | Source: Ambulatory Visit | Attending: Urology | Admitting: Urology

## 2021-02-20 DIAGNOSIS — N130 Hydronephrosis with ureteropelvic junction obstruction: Secondary | ICD-10-CM | POA: Diagnosis not present

## 2021-02-20 MED ORDER — FUROSEMIDE 10 MG/ML IJ SOLN
INTRAMUSCULAR | Status: AC
  Filled 2021-02-20: qty 4

## 2021-02-20 MED ORDER — FUROSEMIDE 10 MG/ML IJ SOLN: 40.0000 mg | Freq: Once | INTRAMUSCULAR | Status: DC

## 2021-02-20 MED ORDER — TECHNETIUM TC 99M MERTIATIDE
5.4000 | Freq: Once | INTRAVENOUS | Status: AC | PRN
  Administered 2021-02-20: 5.4 via INTRAVENOUS

## 2021-04-21 ENCOUNTER — Telehealth: Payer: Self-pay | Admitting: Diagnostic Neuroimaging

## 2021-04-21 NOTE — Telephone Encounter (Signed)
..   Pt understands that although there may be some limitations with this type of visit, we will take all precautions to reduce any security or privacy concerns.  Pt understands that this will be treated like an in office visit and we will file with pt's insurance, and there may be a patient responsible charge related to this service. ? ?

## 2021-04-28 NOTE — Patient Instructions (Addendum)
Below is our plan: ? ?We will continue lamotrigine XR '100mg'$  daily. Definitely check in with your PCP to have fatigue evaluation. I would be happy to discuss weaning lamotrigine if you wish but would recommend we start with repeat EEG. Your last EEG in 2021 did show possible epileptic discharges. Let me know if you would like to pursue!  ? ?Please make sure you are consistent with timing of seizure medication. I recommend annual visit with primary care provider (PCP) for complete physical and routine blood work. We will monitor vitamin D level. I recommend daily intake of vitamin D (400-800iu) and calcium (800-'1000mg'$ ) for bone health. Discuss Dexa screening with PCP.  ? ?According to [place] law, you can not drive unless you are seizure / syncope free for at least 6 months and under physician's care. ? ?Please maintain precautions. Do not participate in activities where a loss of awareness could harm you or someone else. No swimming alone, no tub bathing, no hot tubs, no driving, no operating motorized vehicles (cars, ATVs, motocycles, etc), lawnmowers, power tools or firearms. No standing at heights, such as rooftops, ladders or stairs. Avoid hot objects such as stoves, heaters, open fires. Wear a helmet when riding a bicycle, scooter, skateboard, etc. and avoid areas of traffic. Set your water heater to 120 degrees or less.  ? ?Please make sure you are staying well hydrated. I recommend 50-60 ounces daily. Well balanced diet and regular exercise encouraged. Consistent sleep schedule with 6-8 hours recommended.  ? ?Please continue follow up with care team as directed.  ? ?Follow up with me in 1 year  ? ?You may receive a survey regarding today's visit. I encourage you to leave honest feed back as I do use this information to improve patient care. Thank you for seeing me today!  ? ? ?

## 2021-04-28 NOTE — Progress Notes (Signed)
? ?PATIENT: April Peterson ?DOB: 11-Oct-1980 ? ?REASON FOR VISIT: follow up ?HISTORY FROM: patient ? ?Virtual Visit via Telephone Note ? ?I connected with April Peterson on 04/29/21 at  7:45 AM EDT by telephone and verified that I am speaking with the correct person using two identifiers. ?  ?I discussed the limitations, risks, security and privacy concerns of performing an evaluation and management service by telephone and the availability of in person appointments. I also discussed with the patient that there may be a patient responsible charge related to this service. The patient expressed understanding and agreed to proceed. ? ? ?History of Present Illness: ? ?04/29/21 ALL: ?April Peterson is a 41 y.o. female here today for follow up for seizures. She continues lamotrigine XR '100mg'$  daily at bedtime.  She is doing fairly well. She does endorse more fatigue. She feels it is related to AED. She has tried levetiracetam and could not tolerate due to dizziness. Last EEG 2021 showed rare left frontotemporal sharp waves, possible epileptiform discharges. She is followed routinely by NS. No evidence of recurrence. She had lab work in 10/2020 that was unremarkable with exception of elevated glucose (fasting 136).  ? ?History (copied from Dr Gladstone Lighter previous note) ? ?UPDATE (11/21/19, VRP): Since last visit, doing well. No seizures. Tolerating LTG ER '100mg'$  at bedtime. Some fatigue and intermittent dizziness in AM.  ?  ?PRIOR HPI: 41 year old female here for evaluation of seizure. ?  ?July 2020 patient was 10 days postpartum, when all of a sudden she had difficulty talking, confused, mild right-sided weakness.  Patient went to the hospital for evaluation.  She was found to have a large left frontal meningioma with mass-effect.  She underwent treatment with embolization and resection.  Patient was found to have epileptiform discharges in left frontal region and therefore started on levetiracetam 500 mg twice a day.   Patient took antiseizure medication until September but then was weaned off due to side effect.  Patient did well until January 2021.  Patient had episode of eyes twitching to the right side lasting for 1 minute.  No other postictal symptoms.  Patient felt dehydrated that day from slightly increased alcohol use the night before. ? ? ?Observations/Objective: ? ?Generalized: Well developed, in no acute distress  ?Mentation: Alert oriented to time, place, history taking. Follows all commands speech and language fluent ? ? ?Assessment and Plan: ? ?41 y.o. year old female  has a past medical history of COVID (01/23/2019), COVID (2022), Meningioma ([place]), Seizure ([place]) (07/18/2018), Ureteropelvic junction (UPJ) obstruction (10/03/2020), and Wears glasses. here with ? ?  ICD-10-CM   ?1. Localization-related idiopathic epilepsy and epileptic syndromes with seizures of localized onset, not intractable, without status epilepticus ([place])  G40.009   ?  ? ? ?Kenzy is doing well. We will continue lamotrigine XR '100mg'$  daily. Labs are performed annually with PCP. I have encouraged her to discuss concerns with PCP. May consider fatigue workup. May consider repeating fasting glucose. Father is diabetic. We could consider weaning lamotrigine as NS has cleared her. No recurrence of tumor. I would recommend repeat EEG then may discuss weaning AED if interested. Driving restrictions discussed. Seizure precautions advised. Healthy lifestyle habits encouraged. She will follow up with [place] in 1 year, sooner if needed.  ? ?No orders of the defined types were placed in this encounter. ? ? ?Meds ordered this encounter  ?Medications  ? lamoTRIgine (LAMICTAL XR) 100 MG 24 hour tablet  ?  Sig: Take 1 tablet (100 mg total) by  mouth at bedtime.  ?  Dispense:  90 tablet  ?  Refill:  3  ?  Order Specific Question:   Supervising Provider  ?  AnswerMelvenia Beam [[id number]]  ? ? ? ?Follow Up Instructions: ? ?I discussed the assessment and treatment  plan with the patient. The patient was provided an opportunity to ask questions and all were answered. The patient agreed with the plan and demonstrated an understanding of the instructions. ?  ?The patient was advised to call back or seek an in-person evaluation if the symptoms worsen or if the condition fails to improve as anticipated. ? ?I provided 20 minutes of non-face-to-face time during this encounter. Patient located at their place of residence during [place] visit. Provider is in the office.  ? ? ?Debbora Presto, NP  ?

## 2021-04-29 ENCOUNTER — Encounter: Payer: Self-pay | Admitting: Family Medicine

## 2021-04-29 ENCOUNTER — Telehealth: Payer: BC Managed Care – PPO | Admitting: Family Medicine

## 2021-04-29 DIAGNOSIS — G40009 Localization-related (focal) (partial) idiopathic epilepsy and epileptic syndromes with seizures of localized onset, not intractable, without status epilepticus: Secondary | ICD-10-CM

## 2021-04-29 MED ORDER — LAMOTRIGINE ER 100 MG PO TB24: 100.0000 mg | ORAL_TABLET | Freq: Every day | ORAL | 3 refills | Status: AC
